# Patient Record
Sex: Male | Born: 1965 | Race: White | Hispanic: No | Marital: Single | Smoking: Never smoker
Health system: Southern US, Community
[De-identification: ages and names within clinical notes are randomized; demographics above are authoritative.]

## PROBLEM LIST (undated history)

## (undated) DIAGNOSIS — M1990 Unspecified osteoarthritis, unspecified site: Secondary | ICD-10-CM

## (undated) DIAGNOSIS — S82261N Displaced segmental fracture of shaft of right tibia, subsequent encounter for open fracture type IIIA, IIIB, or IIIC with nonunion: Secondary | ICD-10-CM

## (undated) DIAGNOSIS — I517 Cardiomegaly: Secondary | ICD-10-CM

## (undated) DIAGNOSIS — K219 Gastro-esophageal reflux disease without esophagitis: Secondary | ICD-10-CM

## (undated) DIAGNOSIS — F17200 Nicotine dependence, unspecified, uncomplicated: Secondary | ICD-10-CM

## (undated) HISTORY — PX: HEMICOLECTOMY: SHX854

## (undated) HISTORY — PX: VASECTOMY: SHX75

## (undated) HISTORY — PX: COLON SURGERY: SHX602

## (undated) HISTORY — PX: APPENDECTOMY: SHX54

## (undated) HISTORY — PX: INGUINAL HERNIA REPAIR: SUR1180

## (undated) HISTORY — PX: TONSILLECTOMY: SUR1361

---

## 2003-04-21 ENCOUNTER — Emergency Department (HOSPITAL_COMMUNITY): Admission: EM | Admit: 2003-04-21 | Discharge: 2003-04-21 | Payer: Self-pay | Admitting: Emergency Medicine

## 2008-10-12 ENCOUNTER — Ambulatory Visit: Payer: Self-pay | Admitting: Cardiology

## 2008-10-12 DIAGNOSIS — R9431 Abnormal electrocardiogram [ECG] [EKG]: Secondary | ICD-10-CM

## 2008-10-12 DIAGNOSIS — R0602 Shortness of breath: Secondary | ICD-10-CM

## 2010-08-27 ENCOUNTER — Encounter: Payer: Self-pay | Admitting: Cardiology

## 2012-03-20 ENCOUNTER — Encounter (HOSPITAL_BASED_OUTPATIENT_CLINIC_OR_DEPARTMENT_OTHER): Payer: Self-pay | Admitting: *Deleted

## 2012-03-20 ENCOUNTER — Emergency Department (HOSPITAL_BASED_OUTPATIENT_CLINIC_OR_DEPARTMENT_OTHER)
Admission: EM | Admit: 2012-03-20 | Discharge: 2012-03-21 | Disposition: A | Discharge status: Home / Self Care | Payer: 59 | Attending: Emergency Medicine | Admitting: Emergency Medicine

## 2012-03-20 DIAGNOSIS — Y9389 Activity, other specified: Secondary | ICD-10-CM | POA: Insufficient documentation

## 2012-03-20 DIAGNOSIS — R21 Rash and other nonspecific skin eruption: Secondary | ICD-10-CM | POA: Insufficient documentation

## 2012-03-20 DIAGNOSIS — Y92009 Unspecified place in unspecified non-institutional (private) residence as the place of occurrence of the external cause: Secondary | ICD-10-CM | POA: Insufficient documentation

## 2012-03-20 DIAGNOSIS — IMO0001 Reserved for inherently not codable concepts without codable children: Secondary | ICD-10-CM | POA: Insufficient documentation

## 2012-03-20 MED ORDER — DOXYCYCLINE MONOHYDRATE 100 MG PO CAPS: 100.0000 mg | ORAL_CAPSULE | Freq: Two times a day (BID) | ORAL | Status: DC

## 2012-03-20 MED ORDER — DOXYCYCLINE HYCLATE 100 MG PO TABS
100.0000 mg | ORAL_TABLET | Freq: Once | ORAL | Status: AC
  Administered 2012-03-21: 100 mg via ORAL
  Filled 2012-03-20: qty 1

## 2012-03-20 MED ORDER — NAPROXEN 250 MG PO TABS
500.0000 mg | ORAL_TABLET | Freq: Once | ORAL | Status: AC
  Administered 2012-03-21: 500 mg via ORAL
  Filled 2012-03-20: qty 2

## 2012-03-20 NOTE — ED Notes (Signed)
Pt reports yesterday he was crawling under a house in the crawl space, later yesterday evening pt began experiencing pain and redness to left FA - pt has taken OTC aleve w/ relief of pain. Pt unsure of any insect bites, pt states pain has been radiating up his left arm. Denies itching, fever, body aches, chills. Erythema is approx 5x9cm in size, no induration noted on assessment.

## 2012-03-20 NOTE — ED Notes (Signed)
Pt reports around midnight last night he noticed an area on left forearm, that is painful, red and swollen. Says he feels like the pain goes up his arm

## 2012-03-20 NOTE — ED Provider Notes (Signed)
History     CSN: [id number]  Arrival date & time 03/20/12  2314   First MD Initiated Contact with Patient 03/20/12 2333      Chief Complaint  Patient presents with  . Rash    (Consider location/radiation/quality/duration/timing/severity/associated sxs/prior treatment) HPI Is a 47 year old male who was working underneath his house yesterday evening about 6 PM. About midnight he noticed redness, pain and swelling on the radial side of his distal left wrist. He has had persistent pain and swelling at that spot. It is tender to palpation. He describes the pain as moderate to severe and feeling like a pressure or an aching. It is worse with palpation or movement. It has caused him to have limited use of his left hand. He denies systemic symptoms such as fever, chills, shortness of breath, nausea, vomiting or diarrhea. Is not aware of any trauma. He did not see any insect or arthropod. He has taken Aleve with transient relief of the pain. He has not had any Aleve since about noon.  History reviewed. No pertinent past medical history.  Past Surgical History  Procedure Laterality Date  . Hemicolectomy      Apparently for treatment of appendiceal abscess  . Hernia patch    . Vasectomy      Family History  Problem Relation Age of Onset  . Cardiomyopathy Neg Hx   . Coronary artery disease Neg Hx   . Heart disease Neg Hx     History  Substance Use Topics  . Smoking status: Never Smoker   . Smokeless tobacco: Not on file  . Alcohol Use: Yes     Comment: Rarely      Review of Systems  All other systems reviewed and are negative.    Allergies  Ibuprofen; Metronidazole; and Penicillins  Home Medications  No current outpatient prescriptions on file.  Temp(Src) 98.3 F (36.8 C)  Physical Exam General: Well-developed, well-nourished male in no acute distress; appearance consistent with age of record HENT: normocephalic, atraumatic Eyes: pupils equal round and reactive to  light; extraocular muscles intact Neck: supple Heart: regular rate and rhythm Lungs: clear to auscultation bilaterally Abdomen: soft; nondistended; nontender; bowel sounds present Extremities: No deformity; full range of motion Neurologic: Awake, alert and oriented; motor function intact in all extremities and symmetric; no facial droop Skin: Warm and dry; mildly erythematous, tender, swollen area of the radial side of the distal left wrist about 9 x 5 cm without warmth or lymphangitis Psychiatric: Normal mood and affect    ED Course  Procedures (including critical care time)    MDM  Examination suspicious for insect or arthropod bite. We will treat with an antibiotic as this may represent an early infection.        Hanley Seamen, MD 03/20/12 2351

## 2012-03-21 NOTE — ED Notes (Signed)
Pt ambulating independently w/ steady gait on d/c in no acute distress, A&Ox4. D/c instructions reviewed w/ pt and family - pt and family deny any further questions or concerns at present. Rx given x1  

## 2013-01-14 HISTORY — PX: FRACTURE SURGERY: SHX138

## 2013-10-07 ENCOUNTER — Emergency Department (HOSPITAL_COMMUNITY): Payer: 59

## 2013-10-07 ENCOUNTER — Inpatient Hospital Stay (HOSPITAL_COMMUNITY)
Admission: EM | Admit: 2013-10-07 | Discharge: 2013-10-11 | DRG: 493 | Disposition: A | Discharge status: Home Health | Payer: 59 | Attending: Orthopedic Surgery | Admitting: Orthopedic Surgery

## 2013-10-07 ENCOUNTER — Encounter (HOSPITAL_COMMUNITY): Payer: 59 | Admitting: Anesthesiology

## 2013-10-07 ENCOUNTER — Encounter (HOSPITAL_COMMUNITY): Payer: Self-pay | Admitting: Emergency Medicine

## 2013-10-07 ENCOUNTER — Encounter (HOSPITAL_COMMUNITY)
Admission: EM | Disposition: A | Discharge status: Home Health | Payer: Self-pay | Source: Home / Self Care | Attending: Orthopedic Surgery

## 2013-10-07 ENCOUNTER — Inpatient Hospital Stay (HOSPITAL_COMMUNITY): Payer: 59 | Admitting: Anesthesiology

## 2013-10-07 DIAGNOSIS — S82209B Unspecified fracture of shaft of unspecified tibia, initial encounter for open fracture type I or II: Principal | ICD-10-CM | POA: Diagnosis present

## 2013-10-07 DIAGNOSIS — Z886 Allergy status to analgesic agent status: Secondary | ICD-10-CM | POA: Diagnosis not present

## 2013-10-07 DIAGNOSIS — S82201C Unspecified fracture of shaft of right tibia, initial encounter for open fracture type IIIA, IIIB, or IIIC: Secondary | ICD-10-CM

## 2013-10-07 DIAGNOSIS — D62 Acute posthemorrhagic anemia: Secondary | ICD-10-CM | POA: Diagnosis not present

## 2013-10-07 DIAGNOSIS — Z880 Allergy status to penicillin: Secondary | ICD-10-CM | POA: Diagnosis not present

## 2013-10-07 DIAGNOSIS — D500 Iron deficiency anemia secondary to blood loss (chronic): Secondary | ICD-10-CM | POA: Diagnosis present

## 2013-10-07 DIAGNOSIS — S82401C Unspecified fracture of shaft of right fibula, initial encounter for open fracture type IIIA, IIIB, or IIIC: Secondary | ICD-10-CM

## 2013-10-07 DIAGNOSIS — Z23 Encounter for immunization: Secondary | ICD-10-CM | POA: Diagnosis not present

## 2013-10-07 DIAGNOSIS — S82409B Unspecified fracture of shaft of unspecified fibula, initial encounter for open fracture type I or II: Principal | ICD-10-CM | POA: Diagnosis present

## 2013-10-07 DIAGNOSIS — Z888 Allergy status to other drugs, medicaments and biological substances status: Secondary | ICD-10-CM | POA: Diagnosis not present

## 2013-10-07 HISTORY — DX: Cardiomegaly: I51.7

## 2013-10-07 HISTORY — DX: Gastro-esophageal reflux disease without esophagitis: K21.9

## 2013-10-07 HISTORY — DX: Rider (driver) (passenger) of other motorcycle injured in unspecified traffic accident, initial encounter: V29.99XA

## 2013-10-07 HISTORY — PX: TIBIA IM NAIL INSERTION: SHX2516

## 2013-10-07 HISTORY — DX: Unspecified osteoarthritis, unspecified site: M19.90

## 2013-10-07 LAB — CBC
HCT: 39.8 % (ref 39.0–52.0)
HEMOGLOBIN: 13.6 g/dL (ref 13.0–17.0)
MCH: 31.8 pg (ref 26.0–34.0)
MCHC: 34.2 g/dL (ref 30.0–36.0)
MCV: 93 fL (ref 78.0–100.0)
PLATELETS: 224 10*3/uL (ref 150–400)
RBC: 4.28 MIL/uL (ref 4.22–5.81)
RDW: 13.1 % (ref 11.5–15.5)
WBC: 8.2 10*3/uL (ref 4.0–10.5)

## 2013-10-07 LAB — COMPREHENSIVE METABOLIC PANEL
ALBUMIN: 3.5 g/dL (ref 3.5–5.2)
ALT: 18 U/L (ref 0–53)
AST: 20 U/L (ref 0–37)
Alkaline Phosphatase: 89 U/L (ref 39–117)
Anion gap: 13 (ref 5–15)
BILIRUBIN TOTAL: 0.4 mg/dL (ref 0.3–1.2)
BUN: 15 mg/dL (ref 6–23)
CALCIUM: 8.7 mg/dL (ref 8.4–10.5)
CO2: 20 mEq/L (ref 19–32)
CREATININE: 1.08 mg/dL (ref 0.50–1.35)
Chloride: 104 mEq/L (ref 96–112)
GFR calc Af Amer: >90 mL/min (ref >90)
GFR, EST NON AFRICAN AMERICAN: 80 mL/min — AB (ref >90)
GLUCOSE: 129 mg/dL — AB (ref 70–99)
Potassium: 3.8 mEq/L (ref 3.7–5.3)
Sodium: 137 mEq/L (ref 137–147)
TOTAL PROTEIN: 6.5 g/dL (ref 6.0–8.3)

## 2013-10-07 LAB — ABO/RH: ABO/RH(D): O POS

## 2013-10-07 LAB — CDS SEROLOGY

## 2013-10-07 LAB — PROTIME-INR
INR: 1.02 (ref 0.00–1.49)
PROTHROMBIN TIME: 13.4 s (ref 11.6–15.2)

## 2013-10-07 LAB — ETHANOL: Alcohol, Ethyl (B): <11 mg/dL (ref 0–11)

## 2013-10-07 SURGERY — INSERTION, INTRAMEDULLARY ROD, TIBIA
Anesthesia: General | Site: Leg Lower | Laterality: Right

## 2013-10-07 MED ORDER — PHENYLEPHRINE HCL 10 MG/ML IJ SOLN
INTRAMUSCULAR | Status: DC | PRN
  Administered 2013-10-07 (×8): 40 ug via INTRAVENOUS

## 2013-10-07 MED ORDER — FENTANYL CITRATE 0.05 MG/ML IJ SOLN
INTRAMUSCULAR | Status: AC
  Filled 2013-10-07: qty 2

## 2013-10-07 MED ORDER — ROCURONIUM BROMIDE 50 MG/5ML IV SOLN
INTRAVENOUS | Status: AC
  Filled 2013-10-07: qty 1

## 2013-10-07 MED ORDER — HYDROMORPHONE HCL 1 MG/ML IJ SOLN
INTRAMUSCULAR | Status: AC
  Administered 2013-10-07: 1 mg
  Filled 2013-10-07: qty 1

## 2013-10-07 MED ORDER — LIDOCAINE HCL (CARDIAC) 20 MG/ML IV SOLN
INTRAVENOUS | Status: DC | PRN
  Administered 2013-10-07: 20 mg via INTRAVENOUS

## 2013-10-07 MED ORDER — ALBUMIN HUMAN 5 % IV SOLN
INTRAVENOUS | Status: DC | PRN
  Administered 2013-10-07 (×2): via INTRAVENOUS

## 2013-10-07 MED ORDER — LACTATED RINGERS IV SOLN
INTRAVENOUS | Status: DC | PRN
  Administered 2013-10-07 (×2): via INTRAVENOUS

## 2013-10-07 MED ORDER — CEFAZOLIN SODIUM-DEXTROSE 2-3 GM-% IV SOLR
INTRAVENOUS | Status: DC | PRN
  Administered 2013-10-07: 2 g via INTRAVENOUS

## 2013-10-07 MED ORDER — LIDOCAINE HCL (CARDIAC) 20 MG/ML IV SOLN
INTRAVENOUS | Status: AC
  Filled 2013-10-07: qty 5

## 2013-10-07 MED ORDER — ROCURONIUM BROMIDE 100 MG/10ML IV SOLN
INTRAVENOUS | Status: DC | PRN
  Administered 2013-10-07: 20 mg via INTRAVENOUS

## 2013-10-07 MED ORDER — MORPHINE SULFATE 4 MG/ML IJ SOLN
8.0000 mg | Freq: Once | INTRAMUSCULAR | Status: AC
  Administered 2013-10-07: 8 mg via INTRAVENOUS
  Filled 2013-10-07: qty 2

## 2013-10-07 MED ORDER — TETANUS-DIPHTH-ACELL PERTUSSIS 5-2.5-18.5 LF-MCG/0.5 IM SUSP
INTRAMUSCULAR | Status: AC
  Filled 2013-10-07: qty 0.5

## 2013-10-07 MED ORDER — ONDANSETRON HCL 4 MG/2ML IJ SOLN
INTRAMUSCULAR | Status: AC
  Filled 2013-10-07: qty 2

## 2013-10-07 MED ORDER — CEFAZOLIN SODIUM-DEXTROSE 2-3 GM-% IV SOLR
INTRAVENOUS | Status: DC
Start: 2013-10-07 — End: 2013-10-07
  Filled 2013-10-07: qty 50

## 2013-10-07 MED ORDER — GLYCOPYRROLATE 0.2 MG/ML IJ SOLN
INTRAMUSCULAR | Status: AC
  Filled 2013-10-07: qty 3

## 2013-10-07 MED ORDER — MIDAZOLAM HCL 5 MG/5ML IJ SOLN
INTRAMUSCULAR | Status: DC | PRN
  Administered 2013-10-07: 2 mg via INTRAVENOUS

## 2013-10-07 MED ORDER — CLINDAMYCIN PHOSPHATE 600 MG/50ML IV SOLN: 600.0000 mg | Freq: Once | INTRAVENOUS | Status: DC

## 2013-10-07 MED ORDER — PROPOFOL 10 MG/ML IV BOLUS
INTRAVENOUS | Status: DC | PRN
  Administered 2013-10-07: 200 mg via INTRAVENOUS

## 2013-10-07 MED ORDER — IOHEXOL 300 MG/ML  SOLN
100.0000 mL | Freq: Once | INTRAMUSCULAR | Status: AC | PRN
  Administered 2013-10-07: 100 mL via INTRAVENOUS

## 2013-10-07 MED ORDER — CEFAZOLIN SODIUM 1-5 GM-% IV SOLN
1.0000 g | Freq: Once | INTRAVENOUS | Status: DC
  Administered 2013-10-07: 1 g via INTRAVENOUS
  Filled 2013-10-07: qty 50

## 2013-10-07 MED ORDER — EPHEDRINE SULFATE 50 MG/ML IJ SOLN
INTRAMUSCULAR | Status: DC | PRN
  Administered 2013-10-07 (×5): 10 mg via INTRAVENOUS

## 2013-10-07 MED ORDER — NEOSTIGMINE METHYLSULFATE 10 MG/10ML IV SOLN
INTRAVENOUS | Status: AC
  Filled 2013-10-07: qty 1

## 2013-10-07 MED ORDER — CEFAZOLIN SODIUM-DEXTROSE 2-3 GM-% IV SOLR
INTRAVENOUS | Status: AC
  Filled 2013-10-07: qty 50

## 2013-10-07 MED ORDER — FENTANYL CITRATE 0.05 MG/ML IJ SOLN
100.0000 ug | Freq: Once | INTRAMUSCULAR | Status: AC
  Administered 2013-10-07: 100 ug via INTRAVENOUS

## 2013-10-07 MED ORDER — LEVOFLOXACIN IN D5W 750 MG/150ML IV SOLN
750.0000 mg | Freq: Once | INTRAVENOUS | Status: AC
  Administered 2013-10-07: 750 mg via INTRAVENOUS
  Filled 2013-10-07: qty 150

## 2013-10-07 MED ORDER — SODIUM CHLORIDE 0.9 % IV BOLUS (SEPSIS)
1000.0000 mL | Freq: Once | INTRAVENOUS | Status: AC
  Administered 2013-10-07: 1000 mL via INTRAVENOUS

## 2013-10-07 MED ORDER — FENTANYL CITRATE 0.05 MG/ML IJ SOLN
INTRAMUSCULAR | Status: AC
  Administered 2013-10-07: 100 ug
  Filled 2013-10-07: qty 2

## 2013-10-07 MED ORDER — SUCCINYLCHOLINE CHLORIDE 20 MG/ML IJ SOLN
INTRAMUSCULAR | Status: DC | PRN
  Administered 2013-10-07: 100 mg via INTRAVENOUS

## 2013-10-07 MED ORDER — FENTANYL CITRATE 0.05 MG/ML IJ SOLN
INTRAMUSCULAR | Status: DC | PRN
  Administered 2013-10-07 (×2): 50 ug via INTRAVENOUS
  Administered 2013-10-07: 100 ug via INTRAVENOUS
  Administered 2013-10-07: 50 ug via INTRAVENOUS

## 2013-10-07 MED ORDER — 0.9 % SODIUM CHLORIDE (POUR BTL) OPTIME
TOPICAL | Status: DC | PRN
  Administered 2013-10-07: 1000 mL

## 2013-10-07 MED ORDER — HYDROMORPHONE HCL 1 MG/ML IJ SOLN
1.0000 mg | Freq: Once | INTRAMUSCULAR | Status: AC
  Administered 2013-10-07: 1 mg via INTRAVENOUS
  Filled 2013-10-07: qty 1

## 2013-10-07 MED ORDER — TETANUS-DIPHTH-ACELL PERTUSSIS 5-2.5-18.5 LF-MCG/0.5 IM SUSP
0.5000 mL | Freq: Once | INTRAMUSCULAR | Status: AC
  Administered 2013-10-07: 0.5 mL via INTRAMUSCULAR
  Filled 2013-10-07: qty 0.5

## 2013-10-07 MED ORDER — PROPOFOL 10 MG/ML IV BOLUS
INTRAVENOUS | Status: AC
  Filled 2013-10-07: qty 20

## 2013-10-07 MED ORDER — FENTANYL CITRATE 0.05 MG/ML IJ SOLN
INTRAMUSCULAR | Status: AC
  Filled 2013-10-07: qty 5

## 2013-10-07 MED ORDER — MIDAZOLAM HCL 2 MG/2ML IJ SOLN
INTRAMUSCULAR | Status: AC
  Filled 2013-10-07: qty 2

## 2013-10-07 MED ORDER — ARTIFICIAL TEARS OP OINT
TOPICAL_OINTMENT | OPHTHALMIC | Status: DC | PRN
  Administered 2013-10-07: 1 via OPHTHALMIC

## 2013-10-07 MED ORDER — SUCCINYLCHOLINE CHLORIDE 20 MG/ML IJ SOLN
INTRAMUSCULAR | Status: AC
  Filled 2013-10-07: qty 1

## 2013-10-07 MED ORDER — SODIUM CHLORIDE 0.9 % IR SOLN
Status: DC | PRN
  Administered 2013-10-07: 3000 mL

## 2013-10-07 SURGICAL SUPPLY — 72 items
BANDAGE ELASTIC 4 VELCRO ST LF (GAUZE/BANDAGES/DRESSINGS) IMPLANT
BANDAGE ELASTIC 6 VELCRO ST LF (GAUZE/BANDAGES/DRESSINGS) ×3 IMPLANT
BIT DRILL AO GAMMA 4.2X340 (BIT) ×3 IMPLANT
BIT DRILL AO STRL 3.5X130MM (BIT) ×1 IMPLANT
BLADE SURG 10 STRL SS (BLADE) IMPLANT
BNDG COHESIVE 4X5 TAN STRL (GAUZE/BANDAGES/DRESSINGS) IMPLANT
BNDG GAUZE ELAST 4 BULKY (GAUZE/BANDAGES/DRESSINGS) ×6 IMPLANT
CANISTER WOUND CARE 500ML ATS (WOUND CARE) ×3 IMPLANT
CHLORAPREP W/TINT 26ML (MISCELLANEOUS) IMPLANT
COVER MAYO STAND STRL (DRAPES) IMPLANT
COVER SURGICAL LIGHT HANDLE (MISCELLANEOUS) ×6 IMPLANT
CUFF TOURNIQUET SINGLE 34IN LL (TOURNIQUET CUFF) ×3 IMPLANT
CUFF TOURNIQUET SINGLE 44IN (TOURNIQUET CUFF) IMPLANT
DRAPE C-ARM 42X72 X-RAY (DRAPES) ×3 IMPLANT
DRAPE C-ARMOR (DRAPES) ×3 IMPLANT
DRAPE PROXIMA HALF (DRAPES) ×3 IMPLANT
DRESSING OPSITE X SMALL 2X3 (GAUZE/BANDAGES/DRESSINGS) IMPLANT
DRILL AO STERILE 3.5X130MM (BIT) ×3
DRSG ADAPTIC 3X8 NADH LF (GAUZE/BANDAGES/DRESSINGS) ×3 IMPLANT
DRSG MEPITEL 4X7.2 (GAUZE/BANDAGES/DRESSINGS) ×3 IMPLANT
DRSG PAD ABDOMINAL 8X10 ST (GAUZE/BANDAGES/DRESSINGS) ×6 IMPLANT
DRSG TEGADERM 4X4.75 (GAUZE/BANDAGES/DRESSINGS) IMPLANT
DRSG VAC ATS SM SENSATRAC (GAUZE/BANDAGES/DRESSINGS) ×3 IMPLANT
ELECT CAUTERY BLADE 6.4 (BLADE) IMPLANT
ELECT REM PT RETURN 9FT ADLT (ELECTROSURGICAL) ×3
ELECTRODE REM PT RTRN 9FT ADLT (ELECTROSURGICAL) ×1 IMPLANT
GAUZE SPONGE 4X4 12PLY STRL (GAUZE/BANDAGES/DRESSINGS) ×3 IMPLANT
GLOVE BIO SURGEON STRL SZ7.5 (GLOVE) ×3 IMPLANT
GLOVE BIO SURGEON STRL SZ8.5 (GLOVE) ×3 IMPLANT
GLOVE BIOGEL PI IND STRL 8 (GLOVE) ×3 IMPLANT
GLOVE BIOGEL PI INDICATOR 8 (GLOVE) ×6
GLOVE ORTHO TXT STRL SZ7.5 (GLOVE) ×3 IMPLANT
GOWN STRL REUS W/ TWL LRG LVL3 (GOWN DISPOSABLE) ×2 IMPLANT
GOWN STRL REUS W/TWL LRG LVL3 (GOWN DISPOSABLE) ×4
GUIDEWIRE GAMMA (WIRE) ×3 IMPLANT
GUIDEWIRE GAMMA 800 (WIRE) ×3 IMPLANT
KIT BASIN OR (CUSTOM PROCEDURE TRAY) ×3 IMPLANT
KIT ROOM TURNOVER OR (KITS) ×3 IMPLANT
MANIFOLD NEPTUNE II (INSTRUMENTS) ×3 IMPLANT
NAIL TIBIAL STD 10X375 (Nail) ×3 IMPLANT
NS IRRIG 1000ML POUR BTL (IV SOLUTION) ×3 IMPLANT
PACK ORTHO EXTREMITY (CUSTOM PROCEDURE TRAY) ×3 IMPLANT
PAD ARMBOARD 7.5X6 YLW CONV (MISCELLANEOUS) ×6 IMPLANT
PAD CAST 4YDX4 CTTN HI CHSV (CAST SUPPLIES) ×1 IMPLANT
PADDING CAST COTTON 4X4 STRL (CAST SUPPLIES) ×2
REAMER SHAFT BIXCUT (INSTRUMENTS) ×3 IMPLANT
SCREW GAMMA (Screw) ×3 IMPLANT
SCREW LOCKING T2 F/T  5MMX50MM (Screw) ×2 IMPLANT
SCREW LOCKING T2 F/T  5MMX70MM (Screw) ×2 IMPLANT
SCREW LOCKING T2 F/T  5X37.5MM (Screw) ×2 IMPLANT
SCREW LOCKING T2 F/T 5MMX50MM (Screw) ×1 IMPLANT
SCREW LOCKING T2 F/T 5MMX70MM (Screw) ×1 IMPLANT
SCREW LOCKING T2 F/T 5X37.5MM (Screw) ×1 IMPLANT
SPONGE LAP 18X18 X RAY DECT (DISPOSABLE) ×3 IMPLANT
STAPLER VISISTAT 35W (STAPLE) IMPLANT
SUT ETHILON 3 0 PS 1 (SUTURE) ×6 IMPLANT
SUT MNCRL AB 4-0 PS2 18 (SUTURE) ×3 IMPLANT
SUT MON AB 2-0 CT1 27 (SUTURE) IMPLANT
SUT MON AB 2-0 CT1 36 (SUTURE) ×3 IMPLANT
SUT PDS AB 1 CT  36 (SUTURE) ×4
SUT PDS AB 1 CT 36 (SUTURE) ×2 IMPLANT
SUT VIC AB 0 CT1 27 (SUTURE) ×2
SUT VIC AB 0 CT1 27XBRD ANBCTR (SUTURE) ×1 IMPLANT
SYR BULB IRRIGATION 50ML (SYRINGE) IMPLANT
TOWEL OR 17X24 6PK STRL BLUE (TOWEL DISPOSABLE) ×3 IMPLANT
TOWEL OR 17X26 10 PK STRL BLUE (TOWEL DISPOSABLE) ×3 IMPLANT
TOWEL OR NON WOVEN STRL DISP B (DISPOSABLE) IMPLANT
TRAY FOLEY CATH 16FR SILVER (SET/KITS/TRAYS/PACK) ×3 IMPLANT
TUBE CONNECTING 12'X1/4 (SUCTIONS) ×1
TUBE CONNECTING 12X1/4 (SUCTIONS) ×2 IMPLANT
WATER STERILE IRR 1000ML POUR (IV SOLUTION) ×3 IMPLANT
YANKAUER SUCT BULB TIP NO VENT (SUCTIONS) ×3 IMPLANT
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
## 2013-10-07 NOTE — ED Notes (Signed)
Pt trtansported to CT
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
## 2013-10-07 NOTE — Anesthesia Preprocedure Evaluation (Addendum)
Anesthesia Evaluation  Patient identified by MRN, date of birth, ID band Patient awake    Reviewed: Allergy & Precautions, H&P , NPO status , Patient's Chart, lab work & pertinent test results  Airway Mallampati: II TM Distance: >3 FB Neck ROM: Full    Dental  (+) Teeth Intact, Dental Advisory Given   Pulmonary          Cardiovascular  2010- exercise stress test for dyspnea   Neuro/Psych    GI/Hepatic GERD-  ,  Endo/Other    Renal/GU      Musculoskeletal   Abdominal   Peds  Hematology   Anesthesia Other Findings   Reproductive/Obstetrics                          Anesthesia Physical Anesthesia Plan  ASA: II and emergent  Anesthesia Plan: General   Post-op Pain Management:    Induction: Intravenous  Airway Management Planned: Oral ETT  Additional Equipment:   Intra-op Plan:   Post-operative Plan: Extubation in OR  Informed Consent: I have reviewed the patients History and Physical, chart, labs and discussed the procedure including the risks, benefits and alternatives for the proposed anesthesia with the patient or authorized representative who has indicated his/her understanding and acceptance.     Plan Discussed with: Anesthesiologist and Surgeon  Anesthesia Plan Comments:         Anesthesia Quick Evaluation

## 2013-10-07 NOTE — Progress Notes (Signed)
Orthopedic Tech Progress Note Patient Details:  Bruce Escobar Dec 27, 1965 [id number]  Ortho Devices Type of Ortho Device: Ace wrap;Post (short leg) splint Ortho Device/Splint Location: rle Ortho Device/Splint Interventions: Application   [name] 10/07/2013, 7:19 PM

## 2013-10-07 NOTE — ED Notes (Signed)
Pt is using cell phone. Does not want me to contact family for him.

## 2013-10-08 ENCOUNTER — Encounter (HOSPITAL_COMMUNITY): Payer: Self-pay | Admitting: Orthopedic Surgery

## 2013-10-08 ENCOUNTER — Inpatient Hospital Stay (HOSPITAL_COMMUNITY): Payer: 59

## 2013-10-08 DIAGNOSIS — S82209B Unspecified fracture of shaft of unspecified tibia, initial encounter for open fracture type I or II: Secondary | ICD-10-CM | POA: Diagnosis present

## 2013-10-08 LAB — POCT I-STAT EG7
Acid-base deficit: 6 mmol/L — ABNORMAL HIGH (ref 0.0–2.0)
Bicarbonate: 20.6 mEq/L (ref 20.0–24.0)
CALCIUM ION: 1.12 mmol/L (ref 1.12–1.23)
HEMATOCRIT: 24 % — AB (ref 39.0–52.0)
HEMOGLOBIN: 8.2 g/dL — AB (ref 13.0–17.0)
O2 Saturation: 97 %
PCO2 VEN: 43.1 mmHg — AB (ref 45.0–50.0)
PH VEN: 7.286 (ref 7.250–7.300)
Potassium: 5.4 mEq/L — ABNORMAL HIGH (ref 3.7–5.3)
SODIUM: 139 meq/L (ref 137–147)
TCO2: 22 mmol/L (ref 0–100)
pO2, Ven: 96 mmHg — ABNORMAL HIGH (ref 30.0–45.0)

## 2013-10-08 LAB — SAMPLE TO BLOOD BANK

## 2013-10-08 LAB — POCT I-STAT 4, (NA,K, GLUC, HGB,HCT)
GLUCOSE: 157 mg/dL — AB (ref 70–99)
HEMATOCRIT: 25 % — AB (ref 39.0–52.0)
Hemoglobin: 8.5 g/dL — ABNORMAL LOW (ref 13.0–17.0)
Potassium: 4.9 mEq/L (ref 3.7–5.3)
Sodium: 140 mEq/L (ref 137–147)

## 2013-10-08 LAB — CBC
HCT: 22.1 % — ABNORMAL LOW (ref 39.0–52.0)
HEMOGLOBIN: 7.8 g/dL — AB (ref 13.0–17.0)
MCH: 31.2 pg (ref 26.0–34.0)
MCHC: 35.3 g/dL (ref 30.0–36.0)
MCV: 88.4 fL (ref 78.0–100.0)
Platelets: 106 10*3/uL — ABNORMAL LOW (ref 150–400)
RBC: 2.5 MIL/uL — ABNORMAL LOW (ref 4.22–5.81)
RDW: 14.9 % (ref 11.5–15.5)
WBC: 6.1 10*3/uL (ref 4.0–10.5)

## 2013-10-08 LAB — PREPARE RBC (CROSSMATCH)

## 2013-10-08 MED ORDER — SODIUM CHLORIDE 0.9 % IV SOLN: Freq: Once | INTRAVENOUS | Status: DC

## 2013-10-08 MED ORDER — CEFAZOLIN SODIUM-DEXTROSE 2-3 GM-% IV SOLR
2.0000 g | Freq: Four times a day (QID) | INTRAVENOUS | Status: AC
  Administered 2013-10-08 (×3): 2 g via INTRAVENOUS
  Filled 2013-10-08 (×3): qty 50

## 2013-10-08 MED ORDER — DIPHENHYDRAMINE HCL 12.5 MG/5ML PO ELIX: 12.5000 mg | ORAL_SOLUTION | ORAL | Status: DC | PRN

## 2013-10-08 MED ORDER — OXYCODONE-ACETAMINOPHEN 5-325 MG PO TABS: 2.0000 | ORAL_TABLET | ORAL | Status: DC | PRN

## 2013-10-08 MED ORDER — ONDANSETRON HCL 4 MG/2ML IJ SOLN
INTRAMUSCULAR | Status: DC | PRN
  Administered 2013-10-08: 4 mg via INTRAVENOUS

## 2013-10-08 MED ORDER — ASPIRIN 81 MG PO TABS: 81.0000 mg | ORAL_TABLET | Freq: Every day | ORAL | Status: DC

## 2013-10-08 MED ORDER — HYDROMORPHONE HCL 1 MG/ML IJ SOLN
INTRAMUSCULAR | Status: DC | PRN
  Administered 2013-10-08: 1 mg via INTRAVENOUS

## 2013-10-08 MED ORDER — DOCUSATE SODIUM 100 MG PO CAPS
100.0000 mg | ORAL_CAPSULE | Freq: Two times a day (BID) | ORAL | Status: DC
  Administered 2013-10-08 – 2013-10-11 (×8): 100 mg via ORAL
  Filled 2013-10-08 (×13): qty 1

## 2013-10-08 MED ORDER — HYDROMORPHONE HCL 1 MG/ML IJ SOLN
INTRAMUSCULAR | Status: AC
  Filled 2013-10-08: qty 1

## 2013-10-08 MED ORDER — METOCLOPRAMIDE HCL 5 MG/ML IJ SOLN: 5.0000 mg | Freq: Three times a day (TID) | INTRAMUSCULAR | Status: DC | PRN

## 2013-10-08 MED ORDER — NEOSTIGMINE METHYLSULFATE 10 MG/10ML IV SOLN
INTRAVENOUS | Status: DC | PRN
  Administered 2013-10-08: 3 mg via INTRAVENOUS

## 2013-10-08 MED ORDER — METHOCARBAMOL 1000 MG/10ML IJ SOLN
500.0000 mg | Freq: Four times a day (QID) | INTRAMUSCULAR | Status: DC | PRN
  Filled 2013-10-08: qty 5

## 2013-10-08 MED ORDER — METHOCARBAMOL 500 MG PO TABS
500.0000 mg | ORAL_TABLET | Freq: Four times a day (QID) | ORAL | Status: DC | PRN
  Administered 2013-10-08 – 2013-10-10 (×5): 500 mg via ORAL
  Filled 2013-10-08 (×5): qty 1

## 2013-10-08 MED ORDER — ONDANSETRON HCL 4 MG PO TABS: 4.0000 mg | ORAL_TABLET | Freq: Three times a day (TID) | ORAL | Status: DC | PRN

## 2013-10-08 MED ORDER — DOCUSATE SODIUM 100 MG PO CAPS: 100.0000 mg | ORAL_CAPSULE | Freq: Two times a day (BID) | ORAL | Status: DC

## 2013-10-08 MED ORDER — ONDANSETRON HCL 4 MG/2ML IJ SOLN: 4.0000 mg | Freq: Four times a day (QID) | INTRAMUSCULAR | Status: DC | PRN

## 2013-10-08 MED ORDER — HYDROMORPHONE HCL 1 MG/ML IJ SOLN
1.0000 mg | INTRAMUSCULAR | Status: DC | PRN
  Administered 2013-10-08 – 2013-10-09 (×10): 1 mg via INTRAVENOUS
  Filled 2013-10-08 (×10): qty 1

## 2013-10-08 MED ORDER — GLYCOPYRROLATE 0.2 MG/ML IJ SOLN
INTRAMUSCULAR | Status: DC | PRN
  Administered 2013-10-08: 0.4 mg via INTRAVENOUS

## 2013-10-08 MED ORDER — METOCLOPRAMIDE HCL 10 MG PO TABS: 5.0000 mg | ORAL_TABLET | Freq: Three times a day (TID) | ORAL | Status: DC | PRN

## 2013-10-08 MED ORDER — OXYCODONE-ACETAMINOPHEN 5-325 MG PO TABS
1.0000 | ORAL_TABLET | ORAL | Status: DC | PRN
  Administered 2013-10-08: 2 via ORAL
  Administered 2013-10-08: 1 via ORAL
  Administered 2013-10-08 (×2): 2 via ORAL
  Administered 2013-10-09 (×2): 1 via ORAL
  Administered 2013-10-09: 2 via ORAL
  Administered 2013-10-09 – 2013-10-10 (×4): 1 via ORAL
  Administered 2013-10-11: 2 via ORAL
  Administered 2013-10-11 (×3): 1 via ORAL
  Filled 2013-10-08 (×2): qty 2
  Filled 2013-10-08 (×3): qty 1
  Filled 2013-10-08 (×2): qty 2
  Filled 2013-10-08: qty 1
  Filled 2013-10-08: qty 2
  Filled 2013-10-08 (×2): qty 1
  Filled 2013-10-08: qty 2
  Filled 2013-10-08: qty 1
  Filled 2013-10-08: qty 2
  Filled 2013-10-08: qty 1

## 2013-10-08 MED ORDER — ASPIRIN 81 MG PO CHEW
81.0000 mg | CHEWABLE_TABLET | Freq: Every day | ORAL | Status: DC
  Administered 2013-10-08 – 2013-10-11 (×4): 81 mg via ORAL
  Filled 2013-10-08 (×4): qty 1

## 2013-10-08 MED ORDER — ONDANSETRON HCL 4 MG PO TABS: 4.0000 mg | ORAL_TABLET | Freq: Four times a day (QID) | ORAL | Status: DC | PRN

## 2013-10-08 MED ORDER — DEXTROSE-NACL 5-0.45 % IV SOLN
INTRAVENOUS | Status: AC
  Administered 2013-10-08: 06:00:00 via INTRAVENOUS

## 2013-10-08 MED ORDER — SODIUM CHLORIDE 0.9 % IV SOLN: INTRAVENOUS | Status: AC

## 2013-10-08 MED ORDER — ZOLPIDEM TARTRATE 5 MG PO TABS: 5.0000 mg | ORAL_TABLET | Freq: Every evening | ORAL | Status: DC | PRN

## 2013-10-08 NOTE — Op Note (Signed)
10/07/2013  12:32 AM  PATIENT:  Bruce Escobar    PRE-OPERATIVE DIAGNOSIS:  Open Tibia Fibula Fracture  POST-OPERATIVE DIAGNOSIS:  Same  PROCEDURE:  INTRAMEDULLARY  NAIL TIBIAL right  SURGEON:  Ansley Mangiapane, D, MD  ASSISTANT: Janace Litten, OPA, He was necessary for efficiency and safety of the case.   ANESTHESIA:   General  PREOPERATIVE INDICATIONS:  [name] is a  48 y.o. male with a diagnosis of Open Tibia Fibula Fracture who failed conservative measures and elected for surgical management.    The risks benefits and alternatives were discussed with the patient preoperatively including but not limited to the risks of infection, bleeding, nerve injury, cardiopulmonary complications, the need for revision surgery, among others, and the patient was willing to proceed.  OPERATIVE IMPLANTS: Stryker T2 Tibial nail 375 x 10   BLOOD LOSS: minimal  COMPLICATIONS: none  TOURNIQUET TIME: none  OPERATIVE PROCEDURE:  Patient was identified in the preoperative holding area and site was marked by me He was transported to the operating theater and placed on the table in supine position taking care to pad all bony prominences. After a preincinduction time out anesthesia was induced. The right lower extremity was prepped and draped in normal sterile fashion and a pre-incision timeout was performed. Bruce Escobar received Ancef for preoperative antibiotics.   The leg was placed over the radiolucent triangle. I then made a 4 cm incision over the patella tendon. I dissected down and incised the patella tendon taking care to not penetrate into the joint itself.  The compatements were soft and he had a 13cm laceration over his ant/med fracture site, I debrided and removed some devitalized bone. There was no gross contamination. I irrigated his open fracture with 3L of saline.    I placed a guidewire under fluoroscopic guidance just medial to lateral tibial spine. I was happy with this  placement on all views. I used the entry reamer to create a path into the proximal tibia staying out of the joint itself.  I then passed the ball tip guidewire down the tibia and across the fracture site. I held appropriate reduction confirmed on multiple views of fluoroscopy and sequentially reamed up to an appropriate size with appropriate chatter. I selected the above-sized nail and passed it down the ball-tipped guidewire and seated it completely to a was sunk into the proximal tibia.  I then used the outrigger placed a static transverse and 2 oblique proximal locking screws. As happy with her length and placement on multiple oblique fluoroscopic views.  Next I confirmed appropriate rotation of the tibia with fracture tease and orientation of the patella and toes. I then used a perfect circles technique to place 2 distal static interlock screws.  I then used a nylon stitch to re-aproximate his open fracture wound.   Compartments were still soft.   He received 2U pRBC's  The wounds were then all thoroughly irrigated and closed in layers. Sterile dressing was applied, a wound vac was applied to his traumatic wound and he was taken to the PACU in stable condition.  POST OPERATIVE PLAN: NWB RLE, DVT prophylaxis: Early ambulation, SCD's, ASA 325 daily  This note was generated using a template and dragon dictation system. In light of that, I have reviewed the note and all aspects of it are applicable to this case. Any dictation errors are due to the computerized dictation system.

## 2013-10-08 NOTE — Progress Notes (Signed)
Rehab Admissions Coordinator Note:  Patient was screened by [name] for appropriateness for an Inpatient Acute Rehab Consult.  At this time, we are recommending Inpatient Rehab consult.  [name] 10/08/2013, 11:50 AM  I can be reached at [phone].

## 2013-10-08 NOTE — Progress Notes (Signed)
I participated in the care of this patient and agree with the above history, physical and evaluation. I performed a review of the history and a physical exam as detailed   Janiene Aarons Daniel Kalyn Hofstra MD  
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
## 2013-10-08 NOTE — Progress Notes (Signed)
Utilization review completed.  
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
## 2013-10-08 NOTE — Anesthesia Postprocedure Evaluation (Signed)
  Anesthesia Post-op Note  Patient: Bruce Escobar  Procedure(s) Performed: Procedure(s): INTRAMEDULLARY  NAIL TIBIAL right (Right)  Patient Location: PACU  Anesthesia Type:General  Level of Consciousness: awake, alert , oriented and patient cooperative  Airway and Oxygen Therapy: Patient Spontanous Breathing and Patient connected to nasal cannula oxygen  Post-op Pain: mild  Post-op Assessment: Post-op Vital signs reviewed, Patient's Cardiovascular Status Stable, Respiratory Function Stable, Patent Airway, No signs of Nausea or vomiting and Pain level controlled  Post-op Vital Signs: Reviewed and stable  Last Vitals:  Filed Vitals:   10/08/13 0059  BP: 100/59  Pulse: 111  Temp: 36.8 C  Resp: 14    Complications: No apparent anesthesia complications

## 2013-10-08 NOTE — Transfer of Care (Signed)
Immediate Anesthesia Transfer of Care Note  Patient: Bruce Escobar  Procedure(s) Performed: Procedure(s): INTRAMEDULLARY  NAIL TIBIAL right (Right)  Patient Location: PACU  Anesthesia Type:General  Level of Consciousness: awake, alert  and oriented  Airway & Oxygen Therapy: Patient Spontanous Breathing and Patient connected to nasal cannula oxygen  Post-op Assessment: Report given to PACU RN and Post -op Vital signs reviewed and stable  Post vital signs: Reviewed and stable  Complications: No apparent anesthesia complications

## 2013-10-08 NOTE — Progress Notes (Signed)
Patient ID: Bruce Escobar, male   DOB: 05-13-1965, 48 y.o.   MRN: [id number]     Subjective:  Patient reports pain as mild to moderate.  Patient alert and follows commands worried ablut potential lose of limb  Objective:   VITALS:   Filed Vitals:   10/08/13 0250 10/08/13 0350 10/08/13 0450 10/08/13 0543  BP: 103/67 104/64 112/70 118/74  Pulse: 78 89 86 84  Temp: 97.6 F (36.4 C) 97.7 F (36.5 C) 97.4 F (36.3 C) 97.6 F (36.4 C)  TempSrc: Oral Oral Oral Oral  Resp: Height:      Weight:      SpO2: 98% 99% 99% 100%    ABD soft Sensation intact distally Dorsiflexion/Plantar flexion intact Incision: dressing C/D/I and moderate drainage Wound vac in place and active    Lab Results  Component Value Date   WBC 8.2 10/07/2013   HGB 8.2* 10/07/2013   HCT 24.0* 10/07/2013   MCV 93.0 10/07/2013   PLT 224 10/07/2013     Assessment/Plan: 1 Day Post-Op   Active Problems:   Open fracture of tibia and fibula   Open tibial fracture   Advance diet Up with therapy NWB left lower ext Continue wound vac May need further  I&D or plastics consult for open wound   Torrie Mayers, Lekesha Claw 10/08/2013, 7:19 AM   Margarita Rana MD [phone]
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
## 2013-10-09 DIAGNOSIS — D500 Iron deficiency anemia secondary to blood loss (chronic): Secondary | ICD-10-CM | POA: Diagnosis present

## 2013-10-09 LAB — TYPE AND SCREEN
ABO/RH(D): O POS
Antibody Screen: NEGATIVE
UNIT DIVISION: 0
Unit division: 0

## 2013-10-09 MED ORDER — IBUPROFEN 600 MG PO TABS
600.0000 mg | ORAL_TABLET | Freq: Four times a day (QID) | ORAL | Status: DC | PRN
  Administered 2013-10-09: 300 mg via ORAL
  Filled 2013-10-09: qty 1
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
## 2013-10-09 NOTE — Progress Notes (Signed)
Patient requests Advil for headache. RN asked patient about his allergic reaction to ibuprofen and what reaction he gets from ingesting it. Patient told RN that his face will be flushed the next day. Patient agrees to taking Advil and states that he takes Advil at home as well for headaches but forgot to bring his bottle from home so he could have some when he needed it.

## 2013-10-10 LAB — CBC
HCT: 23.1 % — ABNORMAL LOW (ref 39.0–52.0)
HEMOGLOBIN: 7.9 g/dL — AB (ref 13.0–17.0)
MCH: 31.1 pg (ref 26.0–34.0)
MCHC: 34.2 g/dL (ref 30.0–36.0)
MCV: 90.9 fL (ref 78.0–100.0)
Platelets: 108 10*3/uL — ABNORMAL LOW (ref 150–400)
RBC: 2.54 MIL/uL — ABNORMAL LOW (ref 4.22–5.81)
RDW: 13.6 % (ref 11.5–15.5)
WBC: 5.9 10*3/uL (ref 4.0–10.5)

## 2013-10-10 NOTE — Progress Notes (Signed)
Subjective: 3 Days Post-Op Procedure(s) (LRB): INTRAMEDULLARY  NAIL TIBIAL right (Right) Patient reports pain as 3 on 0-10 scale.    Objective: Vital signs in last 24 hours: Temp:  [98.4 F (36.9 C)-99.2 F (37.3 C)] 98.4 F (36.9 C) (09/27 8413) Pulse Rate:  [89-102] 89 (09/27 0614) Resp:  [18] 18 (09/27 0614) BP: (115-138)/(60-69) 115/67 mmHg (09/27 0614) SpO2:  [98 %-100 %] 100 % (09/27 0614)  Intake/Output from previous day: 09/26 0701 - 09/27 0700 In: 960 [P.O.:960] Out: 3210 [Urine:2600; Drains:610] Intake/Output this shift:     Recent Labs  10/07/13 1650 10/07/13 2311 10/07/13 2359 10/08/13 0643 10/10/13 0655  HGB 13.6 8.5* 8.2* 7.8* 7.9*    Recent Labs  10/08/13 0643 10/10/13 0655  WBC 6.1 5.9  RBC 2.50* 2.54*  HCT 22.1* 23.1*  PLT 106* 108*    Recent Labs  10/07/13 1650 10/07/13 2311 10/07/13 2359  NA 137 140 139  K 3.8 4.9 5.4*  CL 104  --   --   CO2 20  --   --   BUN 15  --   --   CREATININE 1.08  --   --   GLUCOSE 129* 157*  --   CALCIUM 8.7  --   --     Recent Labs  10/07/13 1650  INR 1.02    ABD soft Neurovascular intact Sensation intact distally Incision: vac working.   toes swollen  Assessment/Plan: 3 Days Post-Op Procedure(s) (LRB): INTRAMEDULLARY  NAIL TIBIAL right (Right) Active Problems:   Open fracture of tibia and fibula   Open tibial fracture   Posthemorrhagic anemia  Advance diet Up with therapy Anemia has stabilized will follow conservatively.  May consider a shot of procrit to boost RBC production.  Headache improved.  Peripheral edema is decreasing.  Bruce Escobar J 10/10/2013, 10:57 AM

## 2013-10-10 NOTE — Progress Notes (Addendum)
Patient's Wound Vac unable to maintain suction. Multiple attempts at repositioning and sealing, with no success. MD notified- would like to maintain suction for another day. States if needed, ACE wrap can be removed and new wound vac can be reapplied if unable to regain suction.
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
## 2013-10-11 DIAGNOSIS — S82409A Unspecified fracture of shaft of unspecified fibula, initial encounter for closed fracture: Secondary | ICD-10-CM

## 2013-10-11 DIAGNOSIS — S82209A Unspecified fracture of shaft of unspecified tibia, initial encounter for closed fracture: Secondary | ICD-10-CM

## 2013-10-11 MED ORDER — DOCUSATE SODIUM 100 MG PO CAPS: 100.0000 mg | ORAL_CAPSULE | Freq: Two times a day (BID) | ORAL | Status: DC

## 2013-10-11 MED ORDER — ASPIRIN 81 MG PO TABS: 81.0000 mg | ORAL_TABLET | Freq: Every day | ORAL | Status: DC

## 2013-10-11 MED ORDER — ONDANSETRON HCL 4 MG PO TABS: 4.0000 mg | ORAL_TABLET | Freq: Three times a day (TID) | ORAL | Status: DC | PRN

## 2013-10-11 MED ORDER — OXYCODONE-ACETAMINOPHEN 5-325 MG PO TABS: 2.0000 | ORAL_TABLET | ORAL | Status: DC | PRN
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
## 2013-10-11 NOTE — Discharge Instructions (Signed)
No weight on R leg  Elevate leg  Keep your wound covered
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
## 2013-10-11 NOTE — Progress Notes (Signed)
Agree with SPT.    Phares Zaccone, PT [phone]  

## 2013-10-11 NOTE — Progress Notes (Signed)
[facility]  Patient Status: New  [facility] is providing the following services: PT and OT  If patient discharges after hours, please call [phone].   Bruce Escobar 10/11/2013, 2:48 PM

## 2013-10-11 NOTE — Progress Notes (Signed)
Discharge instructions given. Pt verbalized understanding and all questions were answered.  

## 2013-10-11 NOTE — Progress Notes (Signed)
Patient ID: Bruce Escobar, male   DOB: 1965-12-05, 48 y.o.   MRN: [id number]     Subjective:  Patient reports pain as mild to moderate.  Patient remarks that he is improving and is able to ambulate with the walker  Objective:   VITALS:   Filed Vitals:   10/10/13 0614 10/10/13 1300 10/10/13 1943 10/11/13 0516  BP: 115/67 138/67 128/67 110/57  Pulse: 89 101 91 69  Temp: 98.4 F (36.9 C) 98.6 F (37 C) 100.2 F (37.9 C) 98.9 F (37.2 C)  TempSrc: Oral Oral Oral Oral  Resp: Height:      Weight:      SpO2: 100% 100% 100% 99%    ABD soft Sensation intact distally Dorsiflexion/Plantar flexion intact Incision: dressing C/D/I and no drainage Wound vac removed wound clean and dry no sign of infection and open wound looks good   Lab Results  Component Value Date   WBC 5.9 10/10/2013   HGB 7.9* 10/10/2013   HCT 23.1* 10/10/2013   MCV 90.9 10/10/2013   PLT 108* 10/10/2013     Assessment/Plan: 4 Days Post-Op   Active Problems:   Open fracture of tibia and fibula   Open tibial fracture   Posthemorrhagic anemia   Advance diet Up with therapy Wound vac removed  Dry dressing PRN NWB right lower  Plan for CIR prevalon boot     Haskel Khan 10/11/2013, 7:54 AM   Margarita Rana MD [phone]

## 2013-10-13 NOTE — Progress Notes (Signed)
I participated in the care of this patient and agree with the above history, physical and evaluation. I performed a review of the history and a physical exam as detailed   Marrell Dicaprio Daniel Essie Lagunes MD  

## 2013-11-25 ENCOUNTER — Encounter: Payer: Self-pay | Admitting: Neurology

## 2013-11-25 ENCOUNTER — Ambulatory Visit (INDEPENDENT_AMBULATORY_CARE_PROVIDER_SITE_OTHER): Payer: 59 | Admitting: Neurology

## 2013-11-25 VITALS — BP 92/65 | HR 92 | Temp 98.1°F | Ht 72.0 in | Wt 178.0 lb

## 2013-11-25 DIAGNOSIS — R519 Headache, unspecified: Secondary | ICD-10-CM

## 2013-11-25 DIAGNOSIS — Z72820 Sleep deprivation: Secondary | ICD-10-CM

## 2013-11-25 DIAGNOSIS — R51 Headache: Secondary | ICD-10-CM

## 2013-11-25 MED ORDER — GABAPENTIN 100 MG PO CAPS: 100.0000 mg | ORAL_CAPSULE | Freq: Every day | ORAL | Status: AC

## 2013-11-25 MED ORDER — NAPROXEN 375 MG PO TABS
375.0000 mg | ORAL_TABLET | Freq: Two times a day (BID) | ORAL | Status: DC
Start: 2013-11-25 — End: 2014-08-30

## 2013-11-25 NOTE — Patient Instructions (Addendum)
Please remember, common headache triggers are: sleep deprivation, dehydration, overheating, stress, hypoglycemia or skipping meals and blood sugar fluctuations, excessive pain medications or excessive alcohol use or caffeine withdrawal. Some people have food triggers such as aged cheese, orange juice or chocolate, especially dark chocolate, or MSG (monosodium glutamate). Try to avoid these headache triggers as much possible. It may be helpful to keep a headache diary to figure out what makes your headaches worse or brings them on and what alleviates them. Some people report headache onset after exercise but studies have shown that regular exercise may actually prevent headaches from coming. If you have exercise-induced headaches, please make sure that you drink plenty of fluid before and after exercising and that you do not over do it and do not overheat.  You are likely suffering from what we call rebound headaches and medication overuse headaches.   I will prescribe naproxen as needed up to 2 times a day. Take your acid refux pill for stomach protection.   For sleep we will try Neurontin (gabapentin) 100 mg strength: Take 1 pill nightly at bedtime for sleep and to help reduce headaches. The most common side effects reported are sedation or sleepiness. Rare side effects include balance problems, confusion.
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
## 2014-01-25 ENCOUNTER — Ambulatory Visit: Payer: 59 | Admitting: Neurology

## 2014-04-05 ENCOUNTER — Telehealth: Payer: Self-pay | Admitting: *Deleted

## 2014-04-05 NOTE — Telephone Encounter (Signed)
Form on [employer] desk from [employer].

## 2014-04-06 DIAGNOSIS — Z0289 Encounter for other administrative examinations: Secondary | ICD-10-CM

## 2014-08-20 ENCOUNTER — Emergency Department (HOSPITAL_COMMUNITY): Payer: 59

## 2014-08-20 ENCOUNTER — Emergency Department (HOSPITAL_BASED_OUTPATIENT_CLINIC_OR_DEPARTMENT_OTHER)
Admit: 2014-08-20 | Discharge: 2014-08-20 | Disposition: A | Discharge status: Home / Self Care | Payer: 59 | Attending: Emergency Medicine | Admitting: Emergency Medicine

## 2014-08-20 ENCOUNTER — Inpatient Hospital Stay (HOSPITAL_COMMUNITY)
Admission: EM | Admit: 2014-08-20 | Discharge: 2014-08-30 | DRG: 492 | Disposition: A | Discharge status: Home / Self Care | Payer: 59 | Attending: Internal Medicine | Admitting: Internal Medicine

## 2014-08-20 ENCOUNTER — Encounter (HOSPITAL_COMMUNITY): Payer: Self-pay | Admitting: Emergency Medicine

## 2014-08-20 DIAGNOSIS — Z09 Encounter for follow-up examination after completed treatment for conditions other than malignant neoplasm: Secondary | ICD-10-CM

## 2014-08-20 DIAGNOSIS — Z419 Encounter for procedure for purposes other than remedying health state, unspecified: Secondary | ICD-10-CM

## 2014-08-20 DIAGNOSIS — T84622A Infection and inflammatory reaction due to internal fixation device of right tibia, initial encounter: Secondary | ICD-10-CM | POA: Diagnosis present

## 2014-08-20 DIAGNOSIS — E876 Hypokalemia: Secondary | ICD-10-CM | POA: Diagnosis present

## 2014-08-20 DIAGNOSIS — T847XXA Infection and inflammatory reaction due to other internal orthopedic prosthetic devices, implants and grafts, initial encounter: Secondary | ICD-10-CM | POA: Diagnosis not present

## 2014-08-20 DIAGNOSIS — R258 Other abnormal involuntary movements: Secondary | ICD-10-CM | POA: Insufficient documentation

## 2014-08-20 DIAGNOSIS — A4102 Sepsis due to Methicillin resistant Staphylococcus aureus: Secondary | ICD-10-CM | POA: Diagnosis present

## 2014-08-20 DIAGNOSIS — L03031 Cellulitis of right toe: Secondary | ICD-10-CM | POA: Diagnosis present

## 2014-08-20 DIAGNOSIS — L03115 Cellulitis of right lower limb: Secondary | ICD-10-CM | POA: Diagnosis present

## 2014-08-20 DIAGNOSIS — F17220 Nicotine dependence, chewing tobacco, uncomplicated: Secondary | ICD-10-CM | POA: Diagnosis present

## 2014-08-20 DIAGNOSIS — D72829 Elevated white blood cell count, unspecified: Secondary | ICD-10-CM | POA: Diagnosis not present

## 2014-08-20 DIAGNOSIS — S82409B Unspecified fracture of shaft of unspecified fibula, initial encounter for open fracture type I or II: Secondary | ICD-10-CM

## 2014-08-20 DIAGNOSIS — S82201M Unspecified fracture of shaft of right tibia, subsequent encounter for open fracture type I or II with nonunion: Secondary | ICD-10-CM

## 2014-08-20 DIAGNOSIS — M869 Osteomyelitis, unspecified: Secondary | ICD-10-CM | POA: Diagnosis not present

## 2014-08-20 DIAGNOSIS — R7989 Other specified abnormal findings of blood chemistry: Secondary | ICD-10-CM | POA: Diagnosis present

## 2014-08-20 DIAGNOSIS — Z452 Encounter for adjustment and management of vascular access device: Secondary | ICD-10-CM

## 2014-08-20 DIAGNOSIS — M86161 Other acute osteomyelitis, right tibia and fibula: Secondary | ICD-10-CM | POA: Diagnosis not present

## 2014-08-20 DIAGNOSIS — M1990 Unspecified osteoarthritis, unspecified site: Secondary | ICD-10-CM | POA: Diagnosis present

## 2014-08-20 DIAGNOSIS — L0291 Cutaneous abscess, unspecified: Secondary | ICD-10-CM | POA: Diagnosis not present

## 2014-08-20 DIAGNOSIS — T847XXD Infection and inflammatory reaction due to other internal orthopedic prosthetic devices, implants and grafts, subsequent encounter: Secondary | ICD-10-CM | POA: Diagnosis not present

## 2014-08-20 DIAGNOSIS — E872 Acidosis: Secondary | ICD-10-CM

## 2014-08-20 DIAGNOSIS — M79609 Pain in unspecified limb: Secondary | ICD-10-CM

## 2014-08-20 DIAGNOSIS — M79606 Pain in leg, unspecified: Secondary | ICD-10-CM

## 2014-08-20 DIAGNOSIS — Z22322 Carrier or suspected carrier of Methicillin resistant Staphylococcus aureus: Secondary | ICD-10-CM | POA: Diagnosis not present

## 2014-08-20 DIAGNOSIS — Z79899 Other long term (current) drug therapy: Secondary | ICD-10-CM | POA: Diagnosis not present

## 2014-08-20 DIAGNOSIS — S8291XS Unspecified fracture of right lower leg, sequela: Secondary | ICD-10-CM | POA: Diagnosis not present

## 2014-08-20 DIAGNOSIS — L600 Ingrowing nail: Secondary | ICD-10-CM | POA: Diagnosis present

## 2014-08-20 DIAGNOSIS — Z886 Allergy status to analgesic agent status: Secondary | ICD-10-CM | POA: Diagnosis not present

## 2014-08-20 DIAGNOSIS — Z880 Allergy status to penicillin: Secondary | ICD-10-CM | POA: Diagnosis not present

## 2014-08-20 DIAGNOSIS — Z881 Allergy status to other antibiotic agents status: Secondary | ICD-10-CM | POA: Diagnosis not present

## 2014-08-20 DIAGNOSIS — S82201B Unspecified fracture of shaft of right tibia, initial encounter for open fracture type I or II: Secondary | ICD-10-CM | POA: Insufficient documentation

## 2014-08-20 DIAGNOSIS — B9562 Methicillin resistant Staphylococcus aureus infection as the cause of diseases classified elsewhere: Secondary | ICD-10-CM | POA: Diagnosis not present

## 2014-08-20 DIAGNOSIS — Z791 Long term (current) use of non-steroidal anti-inflammatories (NSAID): Secondary | ICD-10-CM

## 2014-08-20 DIAGNOSIS — S82209B Unspecified fracture of shaft of unspecified tibia, initial encounter for open fracture type I or II: Secondary | ICD-10-CM | POA: Diagnosis present

## 2014-08-20 DIAGNOSIS — K219 Gastro-esophageal reflux disease without esophagitis: Secondary | ICD-10-CM | POA: Diagnosis present

## 2014-08-20 DIAGNOSIS — E871 Hypo-osmolality and hyponatremia: Secondary | ICD-10-CM | POA: Diagnosis present

## 2014-08-20 DIAGNOSIS — L03039 Cellulitis of unspecified toe: Secondary | ICD-10-CM | POA: Diagnosis present

## 2014-08-20 DIAGNOSIS — A419 Sepsis, unspecified organism: Secondary | ICD-10-CM | POA: Diagnosis present

## 2014-08-20 DIAGNOSIS — G061 Intraspinal abscess and granuloma: Secondary | ICD-10-CM

## 2014-08-20 DIAGNOSIS — A4902 Methicillin resistant Staphylococcus aureus infection, unspecified site: Secondary | ICD-10-CM | POA: Diagnosis present

## 2014-08-20 LAB — CBC WITH DIFFERENTIAL/PLATELET
BASOS ABS: 0 10*3/uL (ref 0.0–0.1)
BASOS PCT: 0 % (ref 0–1)
EOS ABS: 0 10*3/uL (ref 0.0–0.7)
EOS PCT: 0 % (ref 0–5)
HCT: 40.7 % (ref 39.0–52.0)
HEMOGLOBIN: 14.3 g/dL (ref 13.0–17.0)
LYMPHS ABS: 0.6 10*3/uL — AB (ref 0.7–4.0)
LYMPHS PCT: 3 % — AB (ref 12–46)
MCH: 31.1 pg (ref 26.0–34.0)
MCHC: 35.1 g/dL (ref 30.0–36.0)
MCV: 88.5 fL (ref 78.0–100.0)
MONOS PCT: 11 % (ref 3–12)
Monocytes Absolute: 2.1 10*3/uL — ABNORMAL HIGH (ref 0.1–1.0)
NEUTROS ABS: 16.7 10*3/uL — AB (ref 1.7–7.7)
Neutrophils Relative %: 86 % — ABNORMAL HIGH (ref 43–77)
PLATELETS: 280 10*3/uL (ref 150–400)
RBC: 4.6 MIL/uL (ref 4.22–5.81)
RDW: 14 % (ref 11.5–15.5)
WBC: 19.4 10*3/uL — ABNORMAL HIGH (ref 4.0–10.5)

## 2014-08-20 LAB — BASIC METABOLIC PANEL
Anion gap: 11 (ref 5–15)
BUN: 11 mg/dL (ref 6–20)
CALCIUM: 9.1 mg/dL (ref 8.9–10.3)
CO2: 18 mmol/L — ABNORMAL LOW (ref 22–32)
CREATININE: 1.2 mg/dL (ref 0.61–1.24)
Chloride: 103 mmol/L (ref 101–111)
GFR calc Af Amer: >60 mL/min (ref >60)
GFR calc non Af Amer: >60 mL/min (ref >60)
GLUCOSE: 116 mg/dL — AB (ref 65–99)
POTASSIUM: 3.8 mmol/L (ref 3.5–5.1)
Sodium: 132 mmol/L — ABNORMAL LOW (ref 135–145)

## 2014-08-20 LAB — APTT: APTT: 32 s (ref 24–37)

## 2014-08-20 LAB — PROTIME-INR
INR: 1.41 (ref 0.00–1.49)
Prothrombin Time: 17.3 seconds — ABNORMAL HIGH (ref 11.6–15.2)

## 2014-08-20 LAB — LACTIC ACID, PLASMA: Lactic Acid, Venous: 1.5 mmol/L (ref 0.5–2.0)

## 2014-08-20 LAB — I-STAT CG4 LACTIC ACID, ED: LACTIC ACID, VENOUS: 2.52 mmol/L — AB (ref 0.5–2.0)

## 2014-08-20 LAB — SEDIMENTATION RATE: SED RATE: 42 mm/h — AB (ref 0–16)

## 2014-08-20 LAB — PROCALCITONIN: Procalcitonin: 0.45 ng/mL

## 2014-08-20 LAB — CK: CK TOTAL: 148 U/L (ref 49–397)

## 2014-08-20 LAB — MRSA PCR SCREENING: MRSA by PCR: POSITIVE — AB

## 2014-08-20 MED ORDER — ENOXAPARIN SODIUM 40 MG/0.4ML ~~LOC~~ SOLN
40.0000 mg | SUBCUTANEOUS | Status: DC
  Administered 2014-08-20 – 2014-08-21 (×2): 40 mg via SUBCUTANEOUS
  Filled 2014-08-20 (×2): qty 0.4

## 2014-08-20 MED ORDER — OXYCODONE HCL 5 MG PO TABS
5.0000 mg | ORAL_TABLET | ORAL | Status: DC | PRN
  Administered 2014-08-20 – 2014-08-22 (×6): 5 mg via ORAL
  Filled 2014-08-20 (×6): qty 1

## 2014-08-20 MED ORDER — ACETAMINOPHEN 325 MG PO TABS
650.0000 mg | ORAL_TABLET | Freq: Four times a day (QID) | ORAL | Status: DC | PRN
  Administered 2014-08-21 (×2): 650 mg via ORAL
  Filled 2014-08-20 (×5): qty 2

## 2014-08-20 MED ORDER — ONDANSETRON HCL 4 MG PO TABS: 4.0000 mg | ORAL_TABLET | Freq: Four times a day (QID) | ORAL | Status: DC | PRN

## 2014-08-20 MED ORDER — SODIUM CHLORIDE 0.9 % IV BOLUS (SEPSIS)
500.0000 mL | INTRAVENOUS | Status: AC
  Administered 2014-08-20: 500 mL via INTRAVENOUS

## 2014-08-20 MED ORDER — MUPIROCIN 2 % EX OINT
1.0000 "application " | TOPICAL_OINTMENT | Freq: Two times a day (BID) | CUTANEOUS | Status: AC
  Administered 2014-08-20 – 2014-08-24 (×9): 1 via NASAL
  Filled 2014-08-20: qty 22

## 2014-08-20 MED ORDER — METRONIDAZOLE IN NACL 5-0.79 MG/ML-% IV SOLN
500.0000 mg | Freq: Three times a day (TID) | INTRAVENOUS | Status: DC
  Filled 2014-08-20 (×2): qty 100

## 2014-08-20 MED ORDER — ALUM & MAG HYDROXIDE-SIMETH 200-200-20 MG/5ML PO SUSP: 30.0000 mL | Freq: Four times a day (QID) | ORAL | Status: DC | PRN

## 2014-08-20 MED ORDER — DOCUSATE SODIUM 100 MG PO CAPS
100.0000 mg | ORAL_CAPSULE | Freq: Two times a day (BID) | ORAL | Status: DC
  Administered 2014-08-20 – 2014-08-30 (×18): 100 mg via ORAL
  Filled 2014-08-20 (×17): qty 1

## 2014-08-20 MED ORDER — VANCOMYCIN HCL 10 G IV SOLR
1750.0000 mg | Freq: Once | INTRAVENOUS | Status: AC
  Administered 2014-08-20: 1750 mg via INTRAVENOUS
  Filled 2014-08-20: qty 1750

## 2014-08-20 MED ORDER — VANCOMYCIN HCL IN DEXTROSE 1-5 GM/200ML-% IV SOLN: 1000.0000 mg | Freq: Once | INTRAVENOUS | Status: DC

## 2014-08-20 MED ORDER — HYDROMORPHONE HCL 1 MG/ML IJ SOLN
1.0000 mg | INTRAMUSCULAR | Status: DC | PRN
  Administered 2014-08-20 – 2014-08-30 (×39): 1 mg via INTRAVENOUS
  Filled 2014-08-20 (×35): qty 1

## 2014-08-20 MED ORDER — ACETAMINOPHEN 650 MG RE SUPP: 650.0000 mg | Freq: Four times a day (QID) | RECTAL | Status: DC | PRN

## 2014-08-20 MED ORDER — SODIUM CHLORIDE 0.9 % IV BOLUS (SEPSIS): 1000.0000 mL | INTRAVENOUS | Status: AC

## 2014-08-20 MED ORDER — VANCOMYCIN HCL IN DEXTROSE 1-5 GM/200ML-% IV SOLN
1000.0000 mg | Freq: Three times a day (TID) | INTRAVENOUS | Status: AC
  Administered 2014-08-20 – 2014-08-24 (×12): 1000 mg via INTRAVENOUS
  Filled 2014-08-20 (×13): qty 200

## 2014-08-20 MED ORDER — AZTREONAM 2 G IJ SOLR: 2.0000 g | Freq: Three times a day (TID) | INTRAMUSCULAR | Status: DC

## 2014-08-20 MED ORDER — SODIUM CHLORIDE 0.9 % IV BOLUS (SEPSIS)
1000.0000 mL | Freq: Once | INTRAVENOUS | Status: AC
  Administered 2014-08-20: 1000 mL via INTRAVENOUS

## 2014-08-20 MED ORDER — METRONIDAZOLE IN NACL 5-0.79 MG/ML-% IV SOLN
500.0000 mg | Freq: Three times a day (TID) | INTRAVENOUS | Status: DC
  Administered 2014-08-21: 500 mg via INTRAVENOUS
  Filled 2014-08-20 (×3): qty 100

## 2014-08-20 MED ORDER — HYDROMORPHONE HCL 1 MG/ML IJ SOLN
1.0000 mg | Freq: Once | INTRAMUSCULAR | Status: AC
  Administered 2014-08-20: 1 mg via INTRAVENOUS
  Filled 2014-08-20: qty 1

## 2014-08-20 MED ORDER — METRONIDAZOLE IN NACL 5-0.79 MG/ML-% IV SOLN
500.0000 mg | Freq: Once | INTRAVENOUS | Status: DC
  Administered 2014-08-20: 500 mg via INTRAVENOUS

## 2014-08-20 MED ORDER — FOLIC ACID 5 MG/ML IJ SOLN
1.0000 mg | Freq: Every day | INTRAMUSCULAR | Status: DC
  Administered 2014-08-20 – 2014-08-22 (×3): 1 mg via INTRAVENOUS
  Filled 2014-08-20 (×4): qty 0.2

## 2014-08-20 MED ORDER — SODIUM CHLORIDE 0.9 % IV SOLN
500.0000 mg | Freq: Four times a day (QID) | INTRAVENOUS | Status: DC
  Filled 2014-08-20 (×4): qty 500

## 2014-08-20 MED ORDER — ONDANSETRON HCL 4 MG/2ML IJ SOLN: 4.0000 mg | Freq: Four times a day (QID) | INTRAMUSCULAR | Status: DC | PRN

## 2014-08-20 MED ORDER — DEXTROSE 5 % IV SOLN
2.0000 g | Freq: Three times a day (TID) | INTRAVENOUS | Status: DC
  Administered 2014-08-20 – 2014-08-22 (×6): 2 g via INTRAVENOUS
  Filled 2014-08-20 (×7): qty 2

## 2014-08-20 MED ORDER — IOHEXOL 300 MG/ML  SOLN
100.0000 mL | Freq: Once | INTRAMUSCULAR | Status: AC | PRN
  Administered 2014-08-20: 100 mL via INTRAVENOUS

## 2014-08-20 MED ORDER — SODIUM CHLORIDE 0.9 % IJ SOLN
3.0000 mL | Freq: Two times a day (BID) | INTRAMUSCULAR | Status: DC
  Administered 2014-08-20 – 2014-08-27 (×10): 3 mL via INTRAVENOUS
  Administered 2014-08-28: 22:00:00 via INTRAVENOUS

## 2014-08-20 MED ORDER — DEXTROSE 5 % IV SOLN
2.0000 g | Freq: Three times a day (TID) | INTRAVENOUS | Status: DC
  Filled 2014-08-20 (×2): qty 2

## 2014-08-20 MED ORDER — CHLORHEXIDINE GLUCONATE CLOTH 2 % EX PADS
6.0000 | MEDICATED_PAD | Freq: Every day | CUTANEOUS | Status: AC
Start: 2014-08-21 — End: 2014-08-25
  Administered 2014-08-21 – 2014-08-25 (×5): 6 via TOPICAL

## 2014-08-20 MED ORDER — DEXTROSE 5 % IV SOLN
2.0000 g | Freq: Once | INTRAVENOUS | Status: DC
  Filled 2014-08-20 (×2): qty 2

## 2014-08-20 MED ORDER — THIAMINE HCL 100 MG/ML IJ SOLN
100.0000 mg | Freq: Every day | INTRAMUSCULAR | Status: DC
  Administered 2014-08-20 – 2014-08-22 (×3): 100 mg via INTRAVENOUS
  Filled 2014-08-20 (×4): qty 1

## 2014-08-20 NOTE — Progress Notes (Addendum)
*  Preliminary Results* Right lower extremity venous duplex completed. Right lower extremity is negative for deep vein thrombosis. There is no evidence of right Baker's cyst.  Incidental finding: There is evidence of a heterogenous area of the right groin, suggestive of an enlarged lymph node.  08/20/2014 1:07 PM  Gertie Fey, RVT, RDCS, RDMS

## 2014-08-20 NOTE — ED Notes (Signed)
Pt reports he had orthopedic surgery September 2015 to right lower extremity. RLE began swelling and causing pain at work yesterday at [employer], denies injury. Pt also has redness from knee down ankle. Foot is also swollen with 2+ pitting edema, and cool to touch in compression to left foot.
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
## 2014-08-20 NOTE — Progress Notes (Signed)
Spoke with Dr. Caffrey/orthopedics-updated him on diagnostic findings and initial presentation and current improvement in clinical status regarding normalized lactic acid and normal CK. Patient will be seen in consultation more likely in a.m. givven improvement in clinical status unless patient's status worsens.  Junious Silk, ANP

## 2014-08-20 NOTE — ED Notes (Signed)
Patient transported to vascular. 

## 2014-08-20 NOTE — ED Notes (Addendum)
Male visitor at pt bedside, noted to be wearing a pt gown and going through cabinets and drawers, visitor had pt belonging bag full of guaze, alcohol pads, surgilube, and kling wrap. Male visitor reports that "the lady Dr gave me these items to dress his wounds at home." Pt said to visitor, "I told you not to be going through those things."  Admitting Dr that was at bedside denied changing the settings or authorizing visitor to take items. Security made aware.
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
## 2014-08-20 NOTE — ED Notes (Signed)
Pt out of room for testing. 
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
## 2014-08-20 NOTE — ED Notes (Signed)
Admitting MD at bedside.

## 2014-08-21 DIAGNOSIS — S8291XS Unspecified fracture of right lower leg, sequela: Secondary | ICD-10-CM

## 2014-08-21 LAB — CBC
HEMATOCRIT: 34.3 % — AB (ref 39.0–52.0)
HEMOGLOBIN: 11.5 g/dL — AB (ref 13.0–17.0)
MCH: 31.1 pg (ref 26.0–34.0)
MCHC: 33.5 g/dL (ref 30.0–36.0)
MCV: 92.7 fL (ref 78.0–100.0)
Platelets: 193 10*3/uL (ref 150–400)
RBC: 3.7 MIL/uL — AB (ref 4.22–5.81)
RDW: 14.3 % (ref 11.5–15.5)
WBC: 17.5 10*3/uL — ABNORMAL HIGH (ref 4.0–10.5)

## 2014-08-21 LAB — COMPREHENSIVE METABOLIC PANEL
ALT: 12 U/L — ABNORMAL LOW (ref 17–63)
AST: 18 U/L (ref 15–41)
Albumin: 2.8 g/dL — ABNORMAL LOW (ref 3.5–5.0)
Alkaline Phosphatase: 97 U/L (ref 38–126)
Anion gap: 8 (ref 5–15)
BILIRUBIN TOTAL: 1.5 mg/dL — AB (ref 0.3–1.2)
BUN: 7 mg/dL (ref 6–20)
CO2: 18 mmol/L — ABNORMAL LOW (ref 22–32)
CREATININE: 0.98 mg/dL (ref 0.61–1.24)
Calcium: 8.3 mg/dL — ABNORMAL LOW (ref 8.9–10.3)
Chloride: 106 mmol/L (ref 101–111)
GFR calc Af Amer: >60 mL/min (ref >60)
Glucose, Bld: 106 mg/dL — ABNORMAL HIGH (ref 65–99)
Potassium: 3.6 mmol/L (ref 3.5–5.1)
SODIUM: 132 mmol/L — AB (ref 135–145)
TOTAL PROTEIN: 5.7 g/dL — AB (ref 6.5–8.1)
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
## 2014-08-21 NOTE — Progress Notes (Signed)
Subjective: Right infected tibial nonunion   Procedure(s) (LRB): REMOVAL OF INTRAMEDULLARY TIBIAL NAIL WITH ANTIBIOTIC EXCHANGE NAIL (Right) Patient reports pain as mild.   Comparison of last outpatient films to now showing obvious bone erosion consistent with osteomyelitis and nonunion.  Objective: Vital signs in last 24 hours: Temp:  [97.6 F (36.4 C)-100.4 F (38 C)] 99.1 F (37.3 C) (08/07 2000) Pulse Rate:  [84-107] 90 (08/07 2000) Resp:  [12-21] 21 (08/07 2000) BP: (104-126)/(49-78) 122/67 mmHg (08/07 2000) SpO2:  [97 %-100 %] 97 % (08/07 2000)  Intake/Output from previous day: 08/06 0701 - 08/07 0700 In: 2540 [P.O.:240; I.V.:1000; IV Piggyback:1300] Out: 1325 [Urine:1325] Intake/Output this shift: Total I/O In: 240 [P.O.:240] Out: -    Recent Labs  08/20/14 1130 08/21/14 0254  HGB 14.3 11.5*    Recent Labs  08/20/14 1130 08/21/14 0254  WBC 19.4* 17.5*  RBC 4.60 3.70*  HCT 40.7 34.3*  PLT 280 193    Recent Labs  08/20/14 1130 08/21/14 0254  NA 132* 132*  K 3.8 3.6  CL 103 106  CO2 18* 18*  BUN 11 7  CREATININE 1.20 0.98  GLUCOSE 116* 106*  CALCIUM 9.1 8.3*    Recent Labs  08/20/14 1505  INR 1.41    Intact pulses distally Compartment soft erythema and swelling distal 1/3 right tibia  Assessment/Plan:   Procedure(s) (LRB): REMOVAL OF INTRAMEDULLARY TIBIAL NAIL WITH ANTIBIOTIC EXCHANGE NAIL (Right)  Will speak with Renaye Rakers or Dr. Carola Frost regarding I&D exchange nailing possible tomorrow so will keep NPO after midnight tonight    Natarsha Hurwitz JR,W D 08/21/2014, 8:56 PM

## 2014-08-22 ENCOUNTER — Inpatient Hospital Stay (HOSPITAL_COMMUNITY): Payer: 59 | Admitting: Anesthesiology

## 2014-08-22 ENCOUNTER — Encounter (HOSPITAL_COMMUNITY): Payer: Self-pay | Admitting: Anesthesiology

## 2014-08-22 ENCOUNTER — Inpatient Hospital Stay (HOSPITAL_COMMUNITY): Payer: 59

## 2014-08-22 ENCOUNTER — Encounter (HOSPITAL_COMMUNITY)
Admission: EM | Disposition: A | Discharge status: Home / Self Care | Payer: Self-pay | Source: Home / Self Care | Attending: Internal Medicine

## 2014-08-22 DIAGNOSIS — S82201J Unspecified fracture of shaft of right tibia, subsequent encounter for open fracture type IIIA, IIIB, or IIIC with delayed healing: Secondary | ICD-10-CM

## 2014-08-22 DIAGNOSIS — S82401J Unspecified fracture of shaft of right fibula, subsequent encounter for open fracture type IIIA, IIIB, or IIIC with delayed healing: Secondary | ICD-10-CM

## 2014-08-22 DIAGNOSIS — L03031 Cellulitis of right toe: Secondary | ICD-10-CM

## 2014-08-22 DIAGNOSIS — L03115 Cellulitis of right lower limb: Secondary | ICD-10-CM

## 2014-08-22 DIAGNOSIS — B9689 Other specified bacterial agents as the cause of diseases classified elsewhere: Secondary | ICD-10-CM

## 2014-08-22 DIAGNOSIS — M869 Osteomyelitis, unspecified: Secondary | ICD-10-CM

## 2014-08-22 DIAGNOSIS — L03039 Cellulitis of unspecified toe: Secondary | ICD-10-CM | POA: Diagnosis present

## 2014-08-22 DIAGNOSIS — L0291 Cutaneous abscess, unspecified: Secondary | ICD-10-CM

## 2014-08-22 DIAGNOSIS — E871 Hypo-osmolality and hyponatremia: Secondary | ICD-10-CM

## 2014-08-22 DIAGNOSIS — Z9689 Presence of other specified functional implants: Secondary | ICD-10-CM

## 2014-08-22 DIAGNOSIS — Z22322 Carrier or suspected carrier of Methicillin resistant Staphylococcus aureus: Secondary | ICD-10-CM | POA: Diagnosis present

## 2014-08-22 DIAGNOSIS — T847XXA Infection and inflammatory reaction due to other internal orthopedic prosthetic devices, implants and grafts, initial encounter: Secondary | ICD-10-CM | POA: Diagnosis present

## 2014-08-22 HISTORY — PX: TIBIA IM NAIL INSERTION: SHX2516

## 2014-08-22 LAB — GRAM STAIN

## 2014-08-22 LAB — CBC
HEMATOCRIT: 37.6 % — AB (ref 39.0–52.0)
Hemoglobin: 12.8 g/dL — ABNORMAL LOW (ref 13.0–17.0)
MCH: 30.9 pg (ref 26.0–34.0)
MCHC: 34 g/dL (ref 30.0–36.0)
MCV: 90.8 fL (ref 78.0–100.0)
PLATELETS: 239 10*3/uL (ref 150–400)
RBC: 4.14 MIL/uL — ABNORMAL LOW (ref 4.22–5.81)
RDW: 14 % (ref 11.5–15.5)
WBC: 17.6 10*3/uL — ABNORMAL HIGH (ref 4.0–10.5)

## 2014-08-22 LAB — COMPREHENSIVE METABOLIC PANEL
ALT: 16 U/L — AB (ref 17–63)
ANION GAP: 9 (ref 5–15)
AST: 21 U/L (ref 15–41)
Albumin: 2.5 g/dL — ABNORMAL LOW (ref 3.5–5.0)
Alkaline Phosphatase: 111 U/L (ref 38–126)
BUN: 7 mg/dL (ref 6–20)
CALCIUM: 8.4 mg/dL — AB (ref 8.9–10.3)
CO2: 24 mmol/L (ref 22–32)
CREATININE: 1.15 mg/dL (ref 0.61–1.24)
Chloride: 102 mmol/L (ref 101–111)
GFR calc Af Amer: >60 mL/min (ref >60)
GFR calc non Af Amer: >60 mL/min (ref >60)
Glucose, Bld: 113 mg/dL — ABNORMAL HIGH (ref 65–99)
Potassium: 3.3 mmol/L — ABNORMAL LOW (ref 3.5–5.1)
Sodium: 135 mmol/L (ref 135–145)
Total Bilirubin: 1 mg/dL (ref 0.3–1.2)
Total Protein: 5.9 g/dL — ABNORMAL LOW (ref 6.5–8.1)

## 2014-08-22 LAB — MAGNESIUM: Magnesium: 1.8 mg/dL (ref 1.7–2.4)

## 2014-08-22 LAB — TSH: TSH: 2.402 u[IU]/mL (ref 0.350–4.500)

## 2014-08-22 LAB — VANCOMYCIN, TROUGH: Vancomycin Tr: 7 ug/mL — ABNORMAL LOW (ref 10.0–20.0)

## 2014-08-22 LAB — TRANSFERRIN: Transferrin: 167 mg/dL — ABNORMAL LOW (ref 180–329)

## 2014-08-22 LAB — PREALBUMIN: PREALBUMIN: 5.5 mg/dL — AB (ref 18–38)

## 2014-08-22 LAB — PHOSPHORUS: PHOSPHORUS: 2.5 mg/dL (ref 2.5–4.6)

## 2014-08-22 LAB — C-REACTIVE PROTEIN: CRP: 32.8 mg/dL — AB (ref <1.0)

## 2014-08-22 SURGERY — INSERTION, INTRAMEDULLARY ROD, TIBIA
Anesthesia: General | Site: Leg Lower | Laterality: Right

## 2014-08-22 MED ORDER — MIDAZOLAM HCL 2 MG/2ML IJ SOLN
INTRAMUSCULAR | Status: AC
  Filled 2014-08-22: qty 4

## 2014-08-22 MED ORDER — METHOCARBAMOL 1000 MG/10ML IJ SOLN
1000.0000 mg | Freq: Four times a day (QID) | INTRAVENOUS | Status: DC | PRN
  Administered 2014-08-29: 1000 mg via INTRAVENOUS
  Filled 2014-08-22 (×3): qty 10

## 2014-08-22 MED ORDER — OXYCODONE-ACETAMINOPHEN 5-325 MG PO TABS
1.0000 | ORAL_TABLET | Freq: Four times a day (QID) | ORAL | Status: DC | PRN
  Administered 2014-08-23: 1 via ORAL
  Administered 2014-08-23 – 2014-08-30 (×8): 2 via ORAL
  Filled 2014-08-22 (×10): qty 2

## 2014-08-22 MED ORDER — OXYCODONE HCL 5 MG PO TABS
5.0000 mg | ORAL_TABLET | Freq: Once | ORAL | Status: AC | PRN
  Administered 2014-08-22: 5 mg via ORAL

## 2014-08-22 MED ORDER — VANCOMYCIN HCL 1000 MG IV SOLR
INTRAVENOUS | Status: DC | PRN
  Administered 2014-08-22: 1000 mg

## 2014-08-22 MED ORDER — ONDANSETRON HCL 4 MG PO TABS: 4.0000 mg | ORAL_TABLET | Freq: Four times a day (QID) | ORAL | Status: DC | PRN

## 2014-08-22 MED ORDER — TOBRAMYCIN SULFATE 1.2 G IJ SOLR
INTRAMUSCULAR | Status: DC | PRN
  Administered 2014-08-22: 1.2 g

## 2014-08-22 MED ORDER — LIDOCAINE HCL (CARDIAC) 20 MG/ML IV SOLN
INTRAVENOUS | Status: DC | PRN
  Administered 2014-08-22: 80 mg via INTRAVENOUS

## 2014-08-22 MED ORDER — METOCLOPRAMIDE HCL 5 MG/ML IJ SOLN
5.0000 mg | Freq: Three times a day (TID) | INTRAMUSCULAR | Status: DC | PRN
  Filled 2014-08-22: qty 2

## 2014-08-22 MED ORDER — METHOCARBAMOL 500 MG PO TABS
500.0000 mg | ORAL_TABLET | Freq: Four times a day (QID) | ORAL | Status: DC | PRN
  Administered 2014-08-22 – 2014-08-27 (×4): 500 mg via ORAL
  Administered 2014-08-28 – 2014-08-29 (×3): 1000 mg via ORAL
  Administered 2014-08-30: 500 mg via ORAL
  Filled 2014-08-22: qty 2
  Filled 2014-08-22 (×3): qty 1
  Filled 2014-08-22 (×2): qty 2
  Filled 2014-08-22: qty 1
  Filled 2014-08-22 (×2): qty 2

## 2014-08-22 MED ORDER — FENTANYL CITRATE (PF) 250 MCG/5ML IJ SOLN
INTRAMUSCULAR | Status: AC
  Filled 2014-08-22: qty 5

## 2014-08-22 MED ORDER — ONDANSETRON HCL 4 MG/2ML IJ SOLN: 4.0000 mg | Freq: Four times a day (QID) | INTRAMUSCULAR | Status: DC | PRN

## 2014-08-22 MED ORDER — FENTANYL CITRATE (PF) 100 MCG/2ML IJ SOLN
25.0000 ug | INTRAMUSCULAR | Status: DC | PRN
  Administered 2014-08-22 (×4): 50 ug via INTRAVENOUS

## 2014-08-22 MED ORDER — PROPOFOL 10 MG/ML IV BOLUS
INTRAVENOUS | Status: DC | PRN
  Administered 2014-08-22: 20 mg via INTRAVENOUS
  Administered 2014-08-22: 160 mg via INTRAVENOUS

## 2014-08-22 MED ORDER — ACETAMINOPHEN 650 MG RE SUPP: 650.0000 mg | Freq: Four times a day (QID) | RECTAL | Status: DC | PRN

## 2014-08-22 MED ORDER — OXYCODONE HCL 5 MG/5ML PO SOLN: 5.0000 mg | Freq: Once | ORAL | Status: AC | PRN

## 2014-08-22 MED ORDER — POTASSIUM CHLORIDE IN NACL 20-0.9 MEQ/L-% IV SOLN
INTRAVENOUS | Status: DC
  Administered 2014-08-22 – 2014-08-23 (×2): via INTRAVENOUS
  Filled 2014-08-22 (×3): qty 1000

## 2014-08-22 MED ORDER — SODIUM CHLORIDE 0.9 % IV SOLN
INTRAVENOUS | Status: AC
  Filled 2014-08-22: qty 1000

## 2014-08-22 MED ORDER — LACTATED RINGERS IV SOLN
INTRAVENOUS | Status: DC
  Administered 2014-08-22 – 2014-08-29 (×3): via INTRAVENOUS

## 2014-08-22 MED ORDER — METHOCARBAMOL 500 MG PO TABS
ORAL_TABLET | ORAL | Status: AC
  Filled 2014-08-22: qty 1

## 2014-08-22 MED ORDER — ONDANSETRON HCL 4 MG/2ML IJ SOLN: 4.0000 mg | Freq: Once | INTRAMUSCULAR | Status: DC | PRN

## 2014-08-22 MED ORDER — OXYCODONE HCL 5 MG PO TABS
5.0000 mg | ORAL_TABLET | ORAL | Status: DC | PRN
Start: 2014-08-22 — End: 2014-08-30
  Administered 2014-08-23 – 2014-08-30 (×19): 10 mg via ORAL
  Administered 2014-08-30: 5 mg via ORAL
  Filled 2014-08-22 (×16): qty 2
  Filled 2014-08-22: qty 1
  Filled 2014-08-22 (×2): qty 2

## 2014-08-22 MED ORDER — VANCOMYCIN HCL 1000 MG IV SOLR
INTRAVENOUS | Status: AC
  Filled 2014-08-22: qty 1000

## 2014-08-22 MED ORDER — DOCUSATE SODIUM 100 MG PO CAPS: 100.0000 mg | ORAL_CAPSULE | Freq: Two times a day (BID) | ORAL | Status: DC

## 2014-08-22 MED ORDER — POTASSIUM CHLORIDE CRYS ER 20 MEQ PO TBCR
40.0000 meq | EXTENDED_RELEASE_TABLET | Freq: Once | ORAL | Status: AC
  Administered 2014-08-22: 40 meq via ORAL
  Filled 2014-08-22: qty 2

## 2014-08-22 MED ORDER — FENTANYL CITRATE (PF) 100 MCG/2ML IJ SOLN
INTRAMUSCULAR | Status: DC | PRN
  Administered 2014-08-22 (×2): 50 ug via INTRAVENOUS
  Administered 2014-08-22: 150 ug via INTRAVENOUS
  Administered 2014-08-22: 100 ug via INTRAVENOUS
  Administered 2014-08-22: 50 ug via INTRAVENOUS
  Administered 2014-08-22: 100 ug via INTRAVENOUS

## 2014-08-22 MED ORDER — HYDROMORPHONE HCL 1 MG/ML IJ SOLN
0.5000 mg | INTRAMUSCULAR | Status: DC | PRN
  Administered 2014-08-23: 0.5 mg via INTRAVENOUS
  Administered 2014-08-24 – 2014-08-29 (×2): 1 mg via INTRAVENOUS
  Filled 2014-08-22 (×10): qty 1

## 2014-08-22 MED ORDER — MAGNESIUM CITRATE PO SOLN
1.0000 | Freq: Once | ORAL | Status: DC | PRN
  Filled 2014-08-22: qty 296

## 2014-08-22 MED ORDER — ACETAMINOPHEN 325 MG PO TABS
650.0000 mg | ORAL_TABLET | Freq: Four times a day (QID) | ORAL | Status: DC | PRN
  Administered 2014-08-24 – 2014-08-29 (×2): 650 mg via ORAL

## 2014-08-22 MED ORDER — HYDROMORPHONE HCL 1 MG/ML IJ SOLN
INTRAMUSCULAR | Status: AC
  Filled 2014-08-22: qty 2

## 2014-08-22 MED ORDER — BISACODYL 5 MG PO TBEC: 5.0000 mg | DELAYED_RELEASE_TABLET | Freq: Every day | ORAL | Status: DC | PRN

## 2014-08-22 MED ORDER — DIPHENHYDRAMINE HCL 12.5 MG/5ML PO ELIX
12.5000 mg | ORAL_SOLUTION | ORAL | Status: DC | PRN
  Filled 2014-08-22: qty 10

## 2014-08-22 MED ORDER — TOBRAMYCIN SULFATE 1.2 G IJ SOLR
INTRAMUSCULAR | Status: AC
  Filled 2014-08-22: qty 1.2

## 2014-08-22 MED ORDER — MIDAZOLAM HCL 5 MG/5ML IJ SOLN
INTRAMUSCULAR | Status: DC | PRN
  Administered 2014-08-22: 2 mg via INTRAVENOUS

## 2014-08-22 MED ORDER — ENOXAPARIN SODIUM 40 MG/0.4ML ~~LOC~~ SOLN
40.0000 mg | SUBCUTANEOUS | Status: DC
  Administered 2014-08-23 – 2014-08-30 (×6): 40 mg via SUBCUTANEOUS
  Filled 2014-08-22 (×7): qty 0.4

## 2014-08-22 MED ORDER — PHENYLEPHRINE HCL 10 MG/ML IJ SOLN
INTRAMUSCULAR | Status: DC | PRN
  Administered 2014-08-22 (×3): 80 ug via INTRAVENOUS

## 2014-08-22 MED ORDER — SODIUM CHLORIDE 0.9 % IR SOLN
Status: DC | PRN
  Administered 2014-08-22: 3000 mL

## 2014-08-22 MED ORDER — HYDROMORPHONE HCL 1 MG/ML IJ SOLN
0.5000 mg | INTRAMUSCULAR | Status: DC | PRN
  Administered 2014-08-22 (×2): 0.5 mg via INTRAVENOUS

## 2014-08-22 MED ORDER — POLYETHYLENE GLYCOL 3350 17 G PO PACK
17.0000 g | PACK | Freq: Every day | ORAL | Status: DC
  Administered 2014-08-22 – 2014-08-30 (×7): 17 g via ORAL
  Filled 2014-08-22 (×7): qty 1

## 2014-08-22 MED ORDER — OXYCODONE HCL 5 MG PO TABS
ORAL_TABLET | ORAL | Status: AC
  Filled 2014-08-22: qty 1

## 2014-08-22 MED ORDER — FENTANYL CITRATE (PF) 100 MCG/2ML IJ SOLN
INTRAMUSCULAR | Status: AC
  Filled 2014-08-22: qty 2

## 2014-08-22 MED ORDER — ROCURONIUM BROMIDE 100 MG/10ML IV SOLN
INTRAVENOUS | Status: DC | PRN
  Administered 2014-08-22: 10 mg via INTRAVENOUS
  Administered 2014-08-22: 40 mg via INTRAVENOUS
  Administered 2014-08-22 (×3): 10 mg via INTRAVENOUS

## 2014-08-22 MED ORDER — METOCLOPRAMIDE HCL 5 MG PO TABS
5.0000 mg | ORAL_TABLET | Freq: Three times a day (TID) | ORAL | Status: DC | PRN
  Filled 2014-08-22: qty 2

## 2014-08-22 SURGICAL SUPPLY — 51 items
BANDAGE ELASTIC 4 VELCRO ST LF (GAUZE/BANDAGES/DRESSINGS) ×3 IMPLANT
BANDAGE ELASTIC 6 VELCRO ST LF (GAUZE/BANDAGES/DRESSINGS) ×3 IMPLANT
BLADE SURG 10 STRL SS (BLADE) ×6 IMPLANT
BNDG GAUZE ELAST 4 BULKY (GAUZE/BANDAGES/DRESSINGS) ×3 IMPLANT
BRUSH FEMORAL CANAL (MISCELLANEOUS) ×3 IMPLANT
BRUSH SCRUB DISP (MISCELLANEOUS) ×6 IMPLANT
CLOSURE WOUND 1/2 X4 (GAUZE/BANDAGES/DRESSINGS) ×1
COVER SURGICAL LIGHT HANDLE (MISCELLANEOUS) ×6 IMPLANT
DRAPE C-ARM 42X72 X-RAY (DRAPES) ×3 IMPLANT
DRAPE C-ARMOR (DRAPES) ×3 IMPLANT
DRAPE EXTREMITY T 121X128X90 (DRAPE) ×6 IMPLANT
DRAPE IMP U-DRAPE 54X76 (DRAPES) ×3 IMPLANT
DRAPE INCISE IOBAN 66X45 STRL (DRAPES) IMPLANT
DRAPE U-SHAPE 47X51 STRL (DRAPES) ×3 IMPLANT
DRSG ADAPTIC 3X8 NADH LF (GAUZE/BANDAGES/DRESSINGS) ×3 IMPLANT
ELECT REM PT RETURN 9FT ADLT (ELECTROSURGICAL) ×3
ELECTRODE REM PT RTRN 9FT ADLT (ELECTROSURGICAL) ×1 IMPLANT
EVACUATOR 1/8 PVC DRAIN (DRAIN) IMPLANT
GAUZE SPONGE 4X4 12PLY STRL (GAUZE/BANDAGES/DRESSINGS) ×3 IMPLANT
GLOVE BIO SURGEON STRL SZ7.5 (GLOVE) ×3 IMPLANT
GLOVE BIO SURGEON STRL SZ8 (GLOVE) ×3 IMPLANT
GLOVE BIO SURGEON STRL SZ8.5 (GLOVE) ×3 IMPLANT
GLOVE BIOGEL PI IND STRL 7.5 (GLOVE) ×1 IMPLANT
GLOVE BIOGEL PI IND STRL 8 (GLOVE) ×1 IMPLANT
GLOVE BIOGEL PI INDICATOR 7.5 (GLOVE) ×2
GLOVE BIOGEL PI INDICATOR 8 (GLOVE) ×2
GOWN STRL REUS W/ TWL LRG LVL3 (GOWN DISPOSABLE) ×2 IMPLANT
GOWN STRL REUS W/ TWL XL LVL3 (GOWN DISPOSABLE) ×1 IMPLANT
GOWN STRL REUS W/TWL LRG LVL3 (GOWN DISPOSABLE) ×4
GOWN STRL REUS W/TWL XL LVL3 (GOWN DISPOSABLE) ×2
GUIDEWIRE GAMMA 800 (WIRE) ×3 IMPLANT
KIT BASIN OR (CUSTOM PROCEDURE TRAY) ×3 IMPLANT
KIT ROOM TURNOVER OR (KITS) ×3 IMPLANT
KIT STIMULAN RAPID CURE  10CC (Orthopedic Implant) ×2 IMPLANT
KIT STIMULAN RAPID CURE 10CC (Orthopedic Implant) ×1 IMPLANT
PACK GENERAL/GYN (CUSTOM PROCEDURE TRAY) ×3 IMPLANT
PACK UNIVERSAL I (CUSTOM PROCEDURE TRAY) ×3 IMPLANT
PAD ARMBOARD 7.5X6 YLW CONV (MISCELLANEOUS) ×6 IMPLANT
PAD NEG PRESSURE SENSATRAC (MISCELLANEOUS) ×3 IMPLANT
REAMER SHAFT BIXCUT (INSTRUMENTS) ×3 IMPLANT
STAPLER VISISTAT 35W (STAPLE) ×3 IMPLANT
STRIP CLOSURE SKIN 1/2X4 (GAUZE/BANDAGES/DRESSINGS) ×2 IMPLANT
SUT VIC AB 0 CT1 27 (SUTURE)
SUT VIC AB 0 CT1 27XBRD ANBCTR (SUTURE) IMPLANT
SUT VIC AB 2-0 CT1 27 (SUTURE)
SUT VIC AB 2-0 CT1 TAPERPNT 27 (SUTURE) IMPLANT
SUT VIC AB 2-0 CT3 27 (SUTURE) IMPLANT
TOWEL OR 17X24 6PK STRL BLUE (TOWEL DISPOSABLE) ×3 IMPLANT
TOWEL OR 17X26 10 PK STRL BLUE (TOWEL DISPOSABLE) ×6 IMPLANT
TRAY FOLEY CATH 16FRSI W/METER (SET/KITS/TRAYS/PACK) ×3 IMPLANT
YANKAUER SUCT BULB TIP NO VENT (SUCTIONS) IMPLANT

## 2014-08-22 NOTE — Progress Notes (Signed)
Orthopedic Tech Progress Note Patient Details:  Bruce Escobar March 18, 1965 [id number] Patient's current bed will not support OHF. Will have to switch out to Ortho bed. Patient ID: Bruce Escobar, male   DOB: July 31, 1965, 49 y.o.   MRN: [id number]   Bruce Escobar 08/22/2014, 11:18 PM

## 2014-08-22 NOTE — Anesthesia Postprocedure Evaluation (Signed)
  Anesthesia Post-op Note  Patient: Bruce Escobar  Procedure(s) Performed: Procedure(s): REMOVAL OF INTRAMEDULLARY TIBIAL NAIL, PARTIAL EXCISION OF RIGHT TIBIA, REMOVAL OF NAIL FROM RIGHT GREAT TOE  (Right)  Patient Location: PACU  Anesthesia Type: General   Level of Consciousness: awake, alert  and oriented  Airway and Oxygen Therapy: Patient Spontanous Breathing  Post-op Pain: mild  Post-op Assessment: Post-op Vital signs reviewed  Post-op Vital Signs: Reviewed  Last Vitals:  Filed Vitals:   08/22/14 1930  BP: 119/68  Pulse: 85  Temp:   Resp: 14    Complications: No apparent anesthesia complications

## 2014-08-22 NOTE — Progress Notes (Signed)
UR COMPLETED  

## 2014-08-22 NOTE — Anesthesia Preprocedure Evaluation (Addendum)
Anesthesia Evaluation  Patient identified by MRN, date of birth, ID band Patient awake    Reviewed: Allergy & Precautions, NPO status , Patient's Chart, lab work & pertinent test results  Airway Mallampati: II  TM Distance: >3 FB Neck ROM: Full    Dental  (+) Teeth Intact, Dental Advisory Given   Pulmonary  breath sounds clear to auscultation        Cardiovascular Exercise Tolerance: Good Rhythm:Regular Rate:Normal     Neuro/Psych    GI/Hepatic Neg liver ROS, GERD-  ,  Endo/Other  negative endocrine ROS  Renal/GU negative Renal ROS  negative genitourinary   Musculoskeletal  (+) Arthritis -,   Abdominal   Peds negative pediatric ROS (+)  Hematology negative hematology ROS (+) anemia ,   Anesthesia Other Findings   Reproductive/Obstetrics negative OB ROS                            Lab Results  Component Value Date   WBC 17.6* 08/22/2014   HGB 12.8* 08/22/2014   HCT 37.6* 08/22/2014   MCV 90.8 08/22/2014   PLT 239 08/22/2014   Lab Results  Component Value Date   CREATININE 1.15 08/22/2014   BUN 7 08/22/2014   NA 135 08/22/2014   K 3.3* 08/22/2014   CL 102 08/22/2014   CO2 24 08/22/2014    Anesthesia Physical Anesthesia Plan  ASA: III  Anesthesia Plan: General   Post-op Pain Management:    Induction: Intravenous  Airway Management Planned: Oral ETT  Additional Equipment:   Intra-op Plan:   Post-operative Plan: Extubation in OR  Informed Consent: I have reviewed the patients History and Physical, chart, labs and discussed the procedure including the risks, benefits and alternatives for the proposed anesthesia with the patient or authorized representative who has indicated his/her understanding and acceptance.   Dental advisory given  Plan Discussed with: CRNA and Anesthesiologist  Anesthesia Plan Comments:        Anesthesia Quick Evaluation
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
## 2014-08-22 NOTE — Progress Notes (Signed)
Upon assessment pt. Has erythema and a blister that has since popped with serous fluid oozing out on his R leg.  Pt. Is scheduled for surgery in the AM.  Dressing applied and will continue to monitor site.

## 2014-08-22 NOTE — Brief Op Note (Signed)
08/20/2014 - 08/22/2014  6:34 PM  PATIENT:  Bruce Escobar  49 y.o. male  PRE-OPERATIVE DIAGNOSIS:   1. Right tibia osteomyelitis 2. 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 Loose tibial nail 4. Chronic infection right great toe eponychia  POST-OPERATIVE DIAGNOSIS:  1. Right tibia osteomyelitis 2. 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 Loose tibial nail 4. Chronic infection right great toe eponychia  PROCEDURE:  Procedure(s): 1. PARTIAL EXCISION OF RIGHT TIBIA 2. REMOVAL OF INTRAMEDULLARY TIBIAL NAIL 3. PARTIAL NAIL ABLATION AND PARTIAL EXCISION OF NAIL FROM RIGHT GREAT TOE  (Right) 4. PLACEMENT OF SMALL WOUND VAC 5. PLACEMENT OF ANTIBIOTIC BEADS  SURGEON:  Surgeon(s) and Role:    * Myrene Galas, MD - Primary  PHYSICIAN ASSISTANT: NONE  ANESTHESIA:   general  I/O:  Total I/O In: 750 [I.V.:700; IV Piggyback:50] Out: 975 [Urine:900; Blood:75]  SPECIMEN:  Source of Specimen:  ABSCESS, DEEP TISSUE, AND REAMINGS  DISPOSITION OF SPECIMEN:  To micro  TOURNIQUET:  * No tourniquets in log *  DICTATION: .Other Dictation: Dictation Number [id number]
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
## 2014-08-22 NOTE — Anesthesia Procedure Notes (Signed)
Procedure Name: Intubation Date/Time: 08/22/2014 3:43 PM Performed by: Charm Barges, Lucretia Pendley R Pre-anesthesia Checklist: Patient identified, Timeout performed, Emergency Drugs available, Suction available and Patient being monitored Patient Re-evaluated:Patient Re-evaluated prior to inductionOxygen Delivery Method: Circle system utilized Preoxygenation: Pre-oxygenation with 100% oxygen Intubation Type: IV induction Ventilation: Mask ventilation without difficulty Laryngoscope Size: Miller and 3 Grade View: Grade I Tube type: Oral Tube size: 7.5 mm Number of attempts: 1 Airway Equipment and Method: Stylet Placement Confirmation: ETT inserted through vocal cords under direct vision,  breath sounds checked- equal and bilateral and positive ETCO2 Secured at: 24 cm Tube secured with: Tape Dental Injury: Teeth and Oropharynx as per pre-operative assessment
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
## 2014-08-22 NOTE — Transfer of Care (Signed)
Immediate Anesthesia Transfer of Care Note  Patient: Bruce Escobar  Procedure(s) Performed: Procedure(s): REMOVAL OF INTRAMEDULLARY TIBIAL NAIL, PARTIAL EXCISION OF RIGHT TIBIA, REMOVAL OF NAIL FROM RIGHT GREAT TOE  (Right)  Patient Location: PACU  Anesthesia Type:General  Level of Consciousness: awake, oriented and patient cooperative  Airway & Oxygen Therapy: Patient Spontanous Breathing and Patient connected to nasal cannula oxygen  Post-op Assessment: Report given to RN, Post -op Vital signs reviewed and stable and Patient moving all extremities  Post vital signs: Reviewed and stable  Last Vitals:  Filed Vitals:   08/22/14 0733  BP: 117/72  Pulse: 98  Temp: 37.4 C  Resp: 19    Complications: No apparent anesthesia complications

## 2014-08-23 ENCOUNTER — Encounter (HOSPITAL_COMMUNITY): Payer: Self-pay | Admitting: Orthopedic Surgery

## 2014-08-23 DIAGNOSIS — S82201B Unspecified fracture of shaft of right tibia, initial encounter for open fracture type I or II: Secondary | ICD-10-CM | POA: Insufficient documentation

## 2014-08-23 DIAGNOSIS — T847XXD Infection and inflammatory reaction due to other internal orthopedic prosthetic devices, implants and grafts, subsequent encounter: Secondary | ICD-10-CM

## 2014-08-23 DIAGNOSIS — Z9889 Other specified postprocedural states: Secondary | ICD-10-CM

## 2014-08-23 DIAGNOSIS — M86161 Other acute osteomyelitis, right tibia and fibula: Secondary | ICD-10-CM

## 2014-08-23 LAB — BASIC METABOLIC PANEL
ANION GAP: 7 (ref 5–15)
BUN: 8 mg/dL (ref 6–20)
CALCIUM: 7.9 mg/dL — AB (ref 8.9–10.3)
CHLORIDE: 102 mmol/L (ref 101–111)
CO2: 28 mmol/L (ref 22–32)
Creatinine, Ser: 1.02 mg/dL (ref 0.61–1.24)
Glucose, Bld: 137 mg/dL — ABNORMAL HIGH (ref 65–99)
POTASSIUM: 3.7 mmol/L (ref 3.5–5.1)
SODIUM: 137 mmol/L (ref 135–145)

## 2014-08-23 LAB — CALCIUM, IONIZED: CALCIUM, IONIZED, SERUM: 5.1 mg/dL (ref 4.5–5.6)

## 2014-08-23 LAB — CBC WITH DIFFERENTIAL/PLATELET
Basophils Absolute: 0 10*3/uL (ref 0.0–0.1)
Basophils Relative: 0 % (ref 0–1)
EOS PCT: 2 % (ref 0–5)
Eosinophils Absolute: 0.2 10*3/uL (ref 0.0–0.7)
HCT: 32.2 % — ABNORMAL LOW (ref 39.0–52.0)
HEMOGLOBIN: 10.8 g/dL — AB (ref 13.0–17.0)
Lymphocytes Relative: 5 % — ABNORMAL LOW (ref 12–46)
Lymphs Abs: 0.6 10*3/uL — ABNORMAL LOW (ref 0.7–4.0)
MCH: 30.5 pg (ref 26.0–34.0)
MCHC: 33.5 g/dL (ref 30.0–36.0)
MCV: 91 fL (ref 78.0–100.0)
MONO ABS: 1.2 10*3/uL — AB (ref 0.1–1.0)
Monocytes Relative: 10 % (ref 3–12)
Neutro Abs: 9.3 10*3/uL — ABNORMAL HIGH (ref 1.7–7.7)
Neutrophils Relative %: 83 % — ABNORMAL HIGH (ref 43–77)
PLATELETS: 251 10*3/uL (ref 150–400)
RBC: 3.54 MIL/uL — AB (ref 4.22–5.81)
RDW: 14.1 % (ref 11.5–15.5)
WBC: 11.2 10*3/uL — ABNORMAL HIGH (ref 4.0–10.5)

## 2014-08-23 LAB — HEMOGLOBIN A1C
Hgb A1c MFr Bld: 5.6 % (ref 4.8–5.6)
Mean Plasma Glucose: 114 mg/dL

## 2014-08-23 LAB — HIV ANTIBODY (ROUTINE TESTING W REFLEX): HIV SCREEN 4TH GENERATION: NONREACTIVE

## 2014-08-23 LAB — VITAMIN D 25 HYDROXY (VIT D DEFICIENCY, FRACTURES): Vit D, 25-Hydroxy: 27.9 ng/mL — ABNORMAL LOW (ref 30.0–100.0)

## 2014-08-23 LAB — NICOTINE/COTININE METABOLITES

## 2014-08-23 LAB — PREALBUMIN: Prealbumin: 4.9 mg/dL — ABNORMAL LOW (ref 18–38)

## 2014-08-23 LAB — MAGNESIUM: Magnesium: 2.1 mg/dL (ref 1.7–2.4)

## 2014-08-23 MED ORDER — FOLIC ACID 1 MG PO TABS
1.0000 mg | ORAL_TABLET | Freq: Every day | ORAL | Status: DC
  Administered 2014-08-23 – 2014-08-30 (×6): 1 mg via ORAL
  Filled 2014-08-23 (×6): qty 1

## 2014-08-23 MED ORDER — VITAMIN B-1 100 MG PO TABS
100.0000 mg | ORAL_TABLET | Freq: Every day | ORAL | Status: DC
  Administered 2014-08-23 – 2014-08-30 (×6): 100 mg via ORAL
  Filled 2014-08-23 (×6): qty 1
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
## 2014-08-23 NOTE — Progress Notes (Addendum)
CM consulted for [facility] services,  equipment  and medication needs. Pt with wound vac, @ d/c pt will need order for [facility]  to manage wound vac.Choice given to pt and pt agreed to use  [facility] for [facility] needs.  Per PT/OT, no recommendations are needed for [facility] services , however pt will need rolling walker and is requesting crutches @ discharge. MD order for rolling walker/crutches will be needed to obtain  for pt @ d/c. Pt without any medication concerns. CM to f/u with disposition needs. Gae Gallop RN,BSN,CM [phone]
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
## 2014-08-24 DIAGNOSIS — A4902 Methicillin resistant Staphylococcus aureus infection, unspecified site: Secondary | ICD-10-CM | POA: Diagnosis present

## 2014-08-24 DIAGNOSIS — B957 Other staphylococcus as the cause of diseases classified elsewhere: Secondary | ICD-10-CM

## 2014-08-24 DIAGNOSIS — L03115 Cellulitis of right lower limb: Secondary | ICD-10-CM | POA: Diagnosis present

## 2014-08-24 LAB — CBC WITH DIFFERENTIAL/PLATELET
Basophils Absolute: 0 10*3/uL (ref 0.0–0.1)
Basophils Relative: 0 % (ref 0–1)
Eosinophils Absolute: 0.3 10*3/uL (ref 0.0–0.7)
Eosinophils Relative: 6 % — ABNORMAL HIGH (ref 0–5)
HEMATOCRIT: 29.2 % — AB (ref 39.0–52.0)
Hemoglobin: 9.7 g/dL — ABNORMAL LOW (ref 13.0–17.0)
LYMPHS ABS: 0.8 10*3/uL (ref 0.7–4.0)
LYMPHS PCT: 14 % (ref 12–46)
MCH: 30.6 pg (ref 26.0–34.0)
MCHC: 33.2 g/dL (ref 30.0–36.0)
MCV: 92.1 fL (ref 78.0–100.0)
MONO ABS: 0.8 10*3/uL (ref 0.1–1.0)
Monocytes Relative: 13 % — ABNORMAL HIGH (ref 3–12)
Neutro Abs: 4.2 10*3/uL (ref 1.7–7.7)
Neutrophils Relative %: 67 % (ref 43–77)
Platelets: 244 10*3/uL (ref 150–400)
RBC: 3.17 MIL/uL — AB (ref 4.22–5.81)
RDW: 14.2 % (ref 11.5–15.5)
WBC: 6.1 10*3/uL (ref 4.0–10.5)

## 2014-08-24 LAB — VANCOMYCIN, TROUGH: VANCOMYCIN TR: 13 ug/mL (ref 10.0–20.0)

## 2014-08-24 LAB — BASIC METABOLIC PANEL
Anion gap: 6 (ref 5–15)
BUN: <5 mg/dL — ABNORMAL LOW (ref 6–20)
CHLORIDE: 105 mmol/L (ref 101–111)
CO2: 28 mmol/L (ref 22–32)
Calcium: 8 mg/dL — ABNORMAL LOW (ref 8.9–10.3)
Creatinine, Ser: 0.93 mg/dL (ref 0.61–1.24)
GFR calc Af Amer: >60 mL/min (ref >60)
GFR calc non Af Amer: >60 mL/min (ref >60)
Glucose, Bld: 156 mg/dL — ABNORMAL HIGH (ref 65–99)
POTASSIUM: 3.5 mmol/L (ref 3.5–5.1)
SODIUM: 139 mmol/L (ref 135–145)

## 2014-08-24 LAB — HEMOGLOBIN A1C
Hgb A1c MFr Bld: 5.7 % — ABNORMAL HIGH (ref 4.8–5.6)
Mean Plasma Glucose: 117 mg/dL

## 2014-08-24 LAB — HEPATITIS C ANTIBODY (REFLEX): HCV Ab: <0.1 s/co ratio (ref 0.0–0.9)

## 2014-08-24 LAB — HCV COMMENT:

## 2014-08-24 MED ORDER — POTASSIUM CHLORIDE CRYS ER 20 MEQ PO TBCR
40.0000 meq | EXTENDED_RELEASE_TABLET | Freq: Once | ORAL | Status: AC
  Administered 2014-08-24: 40 meq via ORAL
  Filled 2014-08-24: qty 2

## 2014-08-24 MED ORDER — VANCOMYCIN HCL IN DEXTROSE 1-5 GM/200ML-% IV SOLN
1000.0000 mg | INTRAVENOUS | Status: DC
  Filled 2014-08-24: qty 200

## 2014-08-24 MED ORDER — VANCOMYCIN HCL 10 G IV SOLR
1250.0000 mg | Freq: Three times a day (TID) | INTRAVENOUS | Status: DC
  Filled 2014-08-24 (×2): qty 1250

## 2014-08-24 NOTE — Progress Notes (Signed)
RN called [facility] for surgical consent order from [facility], [place]. Nursing will continue to monitor.

## 2014-08-24 NOTE — Progress Notes (Signed)
[facility] ([facility])  Subjective: 2 Days Post-Op Procedure(s) (LRB): REMOVAL OF INTRAMEDULLARY TIBIAL NAIL, PARTIAL EXCISION OF RIGHT TIBIA, REMOVAL OF NAIL FROM RIGHT GREAT TOE  (Right) Patient reports pain as mild.   Seen yesterday late pm and this am patient is sleeping.  Vac holding.  Objective: Current Vitals Blood pressure 112/73, pulse 72, temperature 98.8 F (37.1 C), temperature source Oral, resp. rate 19, height 6' (1.829 m), weight 178 lb 5.6 oz (80.9 kg), SpO2 96 %. Vital signs in last 24 hours: Temp:  [98.3 F (36.8 C)-99.2 F (37.3 C)] 98.8 F (37.1 C) (08/10 0400) Pulse Rate:  [72] 72 (08/09 1546) Resp:  [12-20] 19 (08/10 0400) BP: (100-123)/(53-82) 112/73 mmHg (08/10 0400) SpO2:  [96 %-98 %] 96 % (08/10 0400)  Intake/Output from previous day: 08/09 0701 - 08/10 0700 In: 3120 [P.O.:1320; I.V.:1600; IV Piggyback:200] Out: 4025 [Urine:3925; Drains:100]  LABS  Recent Labs  08/22/14 0230 08/23/14 0304 08/24/14 0520  HGB 12.8* 10.8* 9.7*    Recent Labs  08/23/14 0304 08/24/14 0520  WBC 11.2* 6.1  RBC 3.54* 3.17*  HCT 32.2* 29.2*  PLT 251 244    Recent Labs  08/23/14 0304 08/24/14 0520  NA 137 139  K 3.7 3.5  CL 102 105  CO2 28 28  BUN 8 <5*  CREATININE 1.02 0.93  GLUCOSE 137* 156*  CALCIUM 7.9* 8.0*   No results for input(s): LABPT, INR in the last 72 hours.   Physical Exam Buttock pustules resolving.  LLE Vac holding and in place, dressing dry  Sens DPN, SPN, TN intact  Motor EHL, ext, flex, evers 5/5  Toes pink and warm without significant edema  Assessment/Plan: 2 Days Post-Op Procedure(s) (LRB): REMOVAL OF INTRAMEDULLARY TIBIAL NAIL, PARTIAL EXCISION OF RIGHT TIBIA, REMOVAL OF NAIL FROM RIGHT GREAT TOE  (Right)  Plan return to the OR tomorrow for second and last debridement at this stage.  Will place antibiotic impregnated cement nail for structural stability and treatment of infection. Patient may need a free flap  with interval placement of vac vs incisional vac with closure.  PT with NWB, AROM, AAROM, PROM of knee PICC Difficult to predict timing of d/c at this time as depends on wound closure   Myrene Galas, MD [facility] [phone] (p)   08/24/2014, 7:53 AM

## 2014-08-24 NOTE — Progress Notes (Signed)
ANTIBIOTIC CONSULT NOTE - FOLLOW UP  Pharmacy Consult for Vancomycin Indication: osteomylitis  Allergies  Allergen Reactions  . Flagyl [Metronidazole]     Face flush  . Ibuprofen     Face Flush  . Penicillins     Rash    Patient Measurements: Height: 6' (182.9 cm) Weight: 178 lb 5.6 oz (80.9 kg) IBW/kg (Calculated) : 77.6  Vital Signs: Temp: 99.6 F (37.6 C) (08/10 1205) Temp Source: Oral (08/10 1205) BP: 122/99 mmHg (08/10 1630) Intake/Output from previous day: 08/09 0701 - 08/10 0700 In: 3120 [P.O.:1320; I.V.:1600; IV Piggyback:200] Out: 4025 [Urine:3925; Drains:100] Intake/Output from this shift: Total I/O In: 1080 [P.O.:480; IV Piggyback:600] Out: 1175 [Urine:1175]  Labs:  Recent Labs  08/22/14 0230 08/23/14 0304 08/24/14 0520  WBC 17.6* 11.2* 6.1  HGB 12.8* 10.8* 9.7*  PLT 239 251 244  CREATININE 1.15 1.02 0.93   Estimated Creatinine Clearance: 107.8 mL/min (by C-G formula based on Cr of 0.93).  Recent Labs  08/22/14 2144 08/24/14 0520  VANCOTROUGH 7* 13     Microbiology: Recent Results (from the past 720 hour(s))  Culture, blood (x 2)     Status: None (Preliminary result)   Collection Time: 08/20/14  3:00 PM  Result Value Ref Range Status   Specimen Description BLOOD LEFT ARM  Final   Special Requests   Final    BOTTLES DRAWN AEROBIC AND ANAEROBIC RED 5CC BLUE 10CC   Culture NO GROWTH 4 DAYS  Final   Report Status PENDING  Incomplete  Culture, blood (x 2)     Status: None (Preliminary result)   Collection Time: 08/20/14  3:17 PM  Result Value Ref Range Status   Specimen Description BLOOD LEFT HAND  Final   Special Requests BOTTLES DRAWN AEROBIC ONLY 10CC  Final   Culture NO GROWTH 4 DAYS  Final   Report Status PENDING  Incomplete  MRSA PCR Screening     Status: Abnormal   Collection Time: 08/20/14  4:41 PM  Result Value Ref Range Status   MRSA by PCR POSITIVE (A) NEGATIVE Final    Comment:        The GeneXpert MRSA Assay  (FDA approved for NASAL specimens only), is one component of a comprehensive MRSA colonization surveillance program. It is not intended to diagnose MRSA infection nor to guide or monitor treatment for MRSA infections. RESULT CALLED TO, READ BACK BY AND VERIFIED WITH: EVERETT,D RN 08/20/14 1945 WOOTEN,K   Anaerobic culture     Status: None (Preliminary result)   Collection Time: 08/22/14  4:24 PM  Result Value Ref Range Status   Specimen Description ABSCESS RIGHT LEG  Final   Special Requests NONE  Final   Gram Stain   Final    FEW WBC PRESENT, PREDOMINANTLY PMN NO SQUAMOUS EPITHELIAL CELLS SEEN RARE GRAM POSITIVE COCCI IN PAIRS Performed at [facility]    Culture   Final    NO ANAEROBES ISOLATED; CULTURE IN PROGRESS FOR 5 DAYS Performed at [facility]    Report Status PENDING  Incomplete  Gram stain     Status: None   Collection Time: 08/22/14  4:24 PM  Result Value Ref Range Status   Specimen Description ABSCESS RIGHT LEG  Final   Special Requests NONE  Final   Gram Stain   Final    ABUNDANT DEGENERATED CELLULAR MATERIAL PRESENT MODERATE WBC PRESENT,BOTH PMN AND MONONUCLEAR ABUNDANT GRAM POSITIVE COCCI IN PAIRS IN CLUSTERS    Report Status 08/22/2014 FINAL  Final  Culture, routine-abscess     Status: None (Preliminary result)   Collection Time: 08/22/14  4:24 PM  Result Value Ref Range Status   Specimen Description ABSCESS RIGHT LEG  Final   Special Requests NONE  Final   Gram Stain   Final    MODERATE WBC PRESENT,BOTH PMN AND MONONUCLEAR NO SQUAMOUS EPITHELIAL CELLS SEEN RARE GRAM POSITIVE COCCI IN PAIRS Performed at [facility]    Culture   Final    ABUNDANT STAPHYLOCOCCUS AUREUS Note: RIFAMPIN AND GENTAMICIN SHOULD NOT BE USED AS SINGLE DRUGS FOR TREATMENT OF STAPH INFECTIONS. Performed at [facility]    Report Status PENDING  Incomplete  Tissue culture     Status: None (Preliminary result)   Collection Time: 08/22/14   4:36 PM  Result Value Ref Range Status   Specimen Description TISSUE RIGHT TIBIA  Final   Special Requests SPEC B  Final   Gram Stain   Final    MODERATE WBC PRESENT, PREDOMINANTLY PMN NO ORGANISMS SEEN Performed at [facility]    Culture   Final    FEW STAPHYLOCOCCUS AUREUS Note: RIFAMPIN AND GENTAMICIN SHOULD NOT BE USED AS SINGLE DRUGS FOR TREATMENT OF STAPH INFECTIONS. Performed at [facility]    Report Status PENDING  Incomplete  Tissue culture     Status: None (Preliminary result)   Collection Time: 08/22/14  5:17 PM  Result Value Ref Range Status   Specimen Description TISSUE  Final   Special Requests RIGHT TIBIA C  Final   Gram Stain   Final    FEW WBC PRESENT, PREDOMINANTLY PMN NO ORGANISMS SEEN Performed at [facility]    Culture   Final    Culture reincubated for better growth Performed at [facility]    Report Status PENDING  Incomplete    Anti-infectives    Start     Dose/Rate Route Frequency Ordered Stop   08/25/14 0730  vancomycin (VANCOCIN) IVPB 1000 mg/200 mL premix     1,000 mg 200 mL/hr over 60 Minutes Intravenous To [facility] 08/24/14 1100 08/26/14 0730   08/24/14 1045  vancomycin (VANCOCIN) IVPB 1000 mg/200 mL premix  Status:  Discontinued     1,000 mg 200 mL/hr over 60 Minutes Intravenous To [facility] 08/24/14 1031 08/24/14 1100   08/22/14 1620  tobramycin (NEBCIN) powder  Status:  Discontinued       As needed 08/22/14 1620 08/22/14 1827   08/22/14 1620  vancomycin (VANCOCIN) powder  Status:  Discontinued       As needed 08/22/14 1620 08/22/14 1827   08/20/14 2200  aztreonam (AZACTAM) 2 g in dextrose 5 % 50 mL IVPB  Status:  Discontinued     2 g 100 mL/hr over 30 Minutes Intravenous 3 times per day 08/20/14 1459 08/20/14 1831   08/20/14 2200  metroNIDAZOLE (FLAGYL) IVPB 500 mg  Status:  Discontinued     500 mg 100 mL/hr over 60 Minutes Intravenous Every 8 hours 08/20/14 1459 08/20/14 1831    08/20/14 2200  vancomycin (VANCOCIN) IVPB 1000 mg/200 mL premix     1,000 mg 200 mL/hr over 60 Minutes Intravenous Every 8 hours 08/20/14 1459 08/24/14 2359   08/20/14 2200  aztreonam (AZACTAM) injection 2 g  Status:  Discontinued     2 g Intramuscular 3 times per day 08/20/14 1845 08/20/14 1851   08/20/14 1900  aztreonam (AZACTAM) 2 g in dextrose 5 % 50 mL IVPB  Status:  Discontinued     2 g 100 mL/hr over 30 Minutes Intravenous Every 8 hours 08/20/14 1852 08/22/14 1343   08/20/14 1515  aztreonam (AZACTAM) 2 g in dextrose 5 % 50 mL IVPB  Status:  Discontinued     2 g 100 mL/hr over 30 Minutes Intravenous  Once 08/20/14 1443 08/20/14 1831   08/20/14 1445  metroNIDAZOLE (FLAGYL) IVPB 500 mg  Status:  Discontinued     500 mg 100 mL/hr over 60 Minutes Intravenous  Once 08/20/14 1443 08/20/14 1831   08/20/14 1445  vancomycin (VANCOCIN) IVPB 1000 mg/200 mL premix  Status:  Discontinued     1,000 mg 200 mL/hr over 60 Minutes Intravenous  Once 08/20/14 1443 08/20/14 1449   08/20/14 1400  imipenem-cilastatin (PRIMAXIN) 500 mg in sodium chloride 0.9 % 100 mL IVPB  Status:  Discontinued     500 mg 200 mL/hr over 30 Minutes Intravenous 4 times per day 08/20/14 1314 08/20/14 1459   08/20/14 1330  vancomycin (VANCOCIN) 1,750 mg in sodium chloride 0.9 % 500 mL IVPB     1,750 mg 250 mL/hr over 120 Minutes Intravenous  Once 08/20/14 1149 08/20/14 1525   08/20/14 0100  metroNIDAZOLE (FLAGYL) IVPB 500 mg  Status:  Discontinued     500 mg 100 mL/hr over 60 Minutes Intravenous Every 8 hours 08/20/14 1848 08/21/14 0846      Assessment: 49 yo M undergoing 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  Vancomycin trough today on 1gm IV q8h dose was 13 (goal 15-20).  Noted plans for OR tomorrow with pre-op Vancomycin ordered.  Will adjust dose to target higher trough and start after OR tomorrow.  Goal of Therapy:  Vancomycin trough level 15-20 mcg/ml  Plan:  Vancomycin 1250 mg IV q8h - to start  8/11 at 1400 Follow-up clinical progression Repeat trough at steady state  [employer], Pharm.D., BCPS Clinical Pharmacist Pager [phone] 08/24/2014 5:08 PM
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
## 2014-08-25 ENCOUNTER — Inpatient Hospital Stay (HOSPITAL_COMMUNITY): Payer: 59

## 2014-08-25 ENCOUNTER — Encounter (HOSPITAL_COMMUNITY)
Admission: EM | Disposition: A | Discharge status: Home / Self Care | Payer: Self-pay | Source: Home / Self Care | Attending: Internal Medicine

## 2014-08-25 ENCOUNTER — Inpatient Hospital Stay (HOSPITAL_COMMUNITY): Payer: 59 | Admitting: Anesthesiology

## 2014-08-25 DIAGNOSIS — A4902 Methicillin resistant Staphylococcus aureus infection, unspecified site: Secondary | ICD-10-CM

## 2014-08-25 DIAGNOSIS — B9562 Methicillin resistant Staphylococcus aureus infection as the cause of diseases classified elsewhere: Secondary | ICD-10-CM

## 2014-08-25 DIAGNOSIS — T847XXA Infection and inflammatory reaction due to other internal orthopedic prosthetic devices, implants and grafts, initial encounter: Secondary | ICD-10-CM

## 2014-08-25 HISTORY — PX: APPLICATION OF WOUND VAC: SHX5189

## 2014-08-25 HISTORY — PX: INCISION AND DRAINAGE OF WOUND: SHX1803

## 2014-08-25 LAB — TISSUE CULTURE

## 2014-08-25 LAB — VITAMIN B12: VITAMIN B 12: 443 pg/mL (ref 180–914)

## 2014-08-25 LAB — CULTURE, BLOOD (ROUTINE X 2)
Culture: NO GROWTH
Culture: NO GROWTH

## 2014-08-25 LAB — CBC
HCT: 32.4 % — ABNORMAL LOW (ref 39.0–52.0)
Hemoglobin: 10.9 g/dL — ABNORMAL LOW (ref 13.0–17.0)
MCH: 30.9 pg (ref 26.0–34.0)
MCHC: 33.6 g/dL (ref 30.0–36.0)
MCV: 91.8 fL (ref 78.0–100.0)
Platelets: 308 10*3/uL (ref 150–400)
RBC: 3.53 MIL/uL — ABNORMAL LOW (ref 4.22–5.81)
RDW: 14.1 % (ref 11.5–15.5)
WBC: 6.3 10*3/uL (ref 4.0–10.5)

## 2014-08-25 LAB — IRON AND TIBC
Iron: 74 ug/dL (ref 45–182)
Saturation Ratios: 29 % (ref 17.9–39.5)
TIBC: 256 ug/dL (ref 250–450)
UIBC: 182 ug/dL

## 2014-08-25 LAB — CULTURE, ROUTINE-ABSCESS

## 2014-08-25 LAB — BASIC METABOLIC PANEL
ANION GAP: 8 (ref 5–15)
BUN: 6 mg/dL (ref 6–20)
CALCIUM: 8.8 mg/dL — AB (ref 8.9–10.3)
CO2: 28 mmol/L (ref 22–32)
CREATININE: 0.89 mg/dL (ref 0.61–1.24)
Chloride: 104 mmol/L (ref 101–111)
GFR calc Af Amer: >60 mL/min (ref >60)
GLUCOSE: 112 mg/dL — AB (ref 65–99)
Potassium: 4.3 mmol/L (ref 3.5–5.1)
SODIUM: 140 mmol/L (ref 135–145)

## 2014-08-25 LAB — FOLATE: FOLATE: 21.1 ng/mL (ref >5.9)

## 2014-08-25 LAB — FERRITIN: Ferritin: 117 ng/mL (ref 24–336)

## 2014-08-25 LAB — VITAMIN D 1,25 DIHYDROXY
VITAMIN D 1, 25 (OH) TOTAL: 14 pg/mL
Vitamin D2 1, 25 (OH)2: <10 pg/mL
Vitamin D3 1, 25 (OH)2: 14 pg/mL

## 2014-08-25 SURGERY — IRRIGATION AND DEBRIDEMENT WOUND
Site: Leg Lower | Laterality: Right

## 2014-08-25 MED ORDER — NEOSTIGMINE METHYLSULFATE 10 MG/10ML IV SOLN
INTRAVENOUS | Status: DC | PRN
  Administered 2014-08-25: 4 mg via INTRAVENOUS

## 2014-08-25 MED ORDER — LIDOCAINE HCL (CARDIAC) 20 MG/ML IV SOLN
INTRAVENOUS | Status: AC
  Filled 2014-08-25: qty 5

## 2014-08-25 MED ORDER — MIDAZOLAM HCL 2 MG/2ML IJ SOLN
INTRAMUSCULAR | Status: AC
  Filled 2014-08-25: qty 4

## 2014-08-25 MED ORDER — VANCOMYCIN HCL 1000 MG IV SOLR
INTRAVENOUS | Status: AC
  Filled 2014-08-25: qty 1000

## 2014-08-25 MED ORDER — LIDOCAINE HCL (CARDIAC) 20 MG/ML IV SOLN
INTRAVENOUS | Status: DC | PRN
  Administered 2014-08-25: 40 mg via INTRAVENOUS

## 2014-08-25 MED ORDER — VANCOMYCIN HCL 10 G IV SOLR
1250.0000 mg | Freq: Three times a day (TID) | INTRAVENOUS | Status: DC
  Administered 2014-08-25 – 2014-08-27 (×6): 1250 mg via INTRAVENOUS
  Filled 2014-08-25 (×9): qty 1250

## 2014-08-25 MED ORDER — ONDANSETRON HCL 4 MG/2ML IJ SOLN
INTRAMUSCULAR | Status: DC | PRN
  Administered 2014-08-25: 4 mg via INTRAVENOUS

## 2014-08-25 MED ORDER — MEPERIDINE HCL 25 MG/ML IJ SOLN: 6.2500 mg | INTRAMUSCULAR | Status: DC | PRN

## 2014-08-25 MED ORDER — MIDAZOLAM HCL 2 MG/2ML IJ SOLN
0.5000 mg | Freq: Once | INTRAMUSCULAR | Status: AC | PRN
  Administered 2014-08-25: 0.5 mg via INTRAVENOUS

## 2014-08-25 MED ORDER — LORAZEPAM 2 MG/ML IJ SOLN
INTRAMUSCULAR | Status: AC
  Filled 2014-08-25: qty 1

## 2014-08-25 MED ORDER — 0.9 % SODIUM CHLORIDE (POUR BTL) OPTIME
TOPICAL | Status: DC | PRN
  Administered 2014-08-25 (×3): 1000 mL

## 2014-08-25 MED ORDER — GLYCOPYRROLATE 0.2 MG/ML IJ SOLN
INTRAMUSCULAR | Status: AC
  Filled 2014-08-25: qty 4

## 2014-08-25 MED ORDER — ROCURONIUM BROMIDE 50 MG/5ML IV SOLN
INTRAVENOUS | Status: AC
  Filled 2014-08-25: qty 2

## 2014-08-25 MED ORDER — PROMETHAZINE HCL 25 MG/ML IJ SOLN: 6.2500 mg | INTRAMUSCULAR | Status: DC | PRN

## 2014-08-25 MED ORDER — VANCOMYCIN HCL 10 G IV SOLR
1250.0000 mg | Freq: Three times a day (TID) | INTRAVENOUS | Status: DC
  Filled 2014-08-25 (×3): qty 1250

## 2014-08-25 MED ORDER — ONDANSETRON HCL 4 MG/2ML IJ SOLN
INTRAMUSCULAR | Status: AC
  Filled 2014-08-25: qty 2

## 2014-08-25 MED ORDER — HYDROMORPHONE HCL 1 MG/ML IJ SOLN
INTRAMUSCULAR | Status: AC
  Filled 2014-08-25: qty 1

## 2014-08-25 MED ORDER — LORAZEPAM 2 MG/ML IJ SOLN
1.0000 mg | Freq: Once | INTRAMUSCULAR | Status: AC
  Administered 2014-08-25: 1 mg via INTRAVENOUS

## 2014-08-25 MED ORDER — FENTANYL CITRATE (PF) 250 MCG/5ML IJ SOLN
INTRAMUSCULAR | Status: AC
  Filled 2014-08-25: qty 5

## 2014-08-25 MED ORDER — GLYCOPYRROLATE 0.2 MG/ML IJ SOLN
INTRAMUSCULAR | Status: DC | PRN
  Administered 2014-08-25: 0.6 mg via INTRAVENOUS

## 2014-08-25 MED ORDER — ROCURONIUM BROMIDE 100 MG/10ML IV SOLN
INTRAVENOUS | Status: DC | PRN
  Administered 2014-08-25: 50 mg via INTRAVENOUS
  Administered 2014-08-25: 30 mg via INTRAVENOUS

## 2014-08-25 MED ORDER — MIDAZOLAM HCL 5 MG/5ML IJ SOLN
INTRAMUSCULAR | Status: DC | PRN
  Administered 2014-08-25: 2 mg via INTRAVENOUS

## 2014-08-25 MED ORDER — TOBRAMYCIN SULFATE 1.2 G IJ SOLR
INTRAMUSCULAR | Status: AC
Start: 2014-08-25 — End: 2014-08-25
  Filled 2014-08-25: qty 1.2

## 2014-08-25 MED ORDER — PROPOFOL 10 MG/ML IV BOLUS
INTRAVENOUS | Status: DC | PRN
  Administered 2014-08-25: 200 mg via INTRAVENOUS

## 2014-08-25 MED ORDER — FENTANYL CITRATE (PF) 100 MCG/2ML IJ SOLN
INTRAMUSCULAR | Status: DC | PRN
Start: 2014-08-25 — End: 2014-08-25
  Administered 2014-08-25: 100 ug via INTRAVENOUS
  Administered 2014-08-25: 250 ug via INTRAVENOUS
  Administered 2014-08-25: 50 ug via INTRAVENOUS
  Administered 2014-08-25: 100 ug via INTRAVENOUS
  Administered 2014-08-25 (×3): 50 ug via INTRAVENOUS

## 2014-08-25 MED ORDER — VANCOMYCIN HCL 1000 MG IV SOLR
INTRAVENOUS | Status: DC | PRN
  Administered 2014-08-25: 1000 mg

## 2014-08-25 MED ORDER — PROPOFOL 10 MG/ML IV BOLUS
INTRAVENOUS | Status: AC
  Filled 2014-08-25: qty 20

## 2014-08-25 MED ORDER — HYDROMORPHONE HCL 1 MG/ML IJ SOLN
0.2500 mg | INTRAMUSCULAR | Status: DC | PRN
  Administered 2014-08-25 (×2): 0.5 mg via INTRAVENOUS

## 2014-08-25 MED ORDER — TOBRAMYCIN SULFATE 1.2 G IJ SOLR
INTRAMUSCULAR | Status: DC | PRN
  Administered 2014-08-25: 1.2 g

## 2014-08-25 MED ORDER — MIDAZOLAM HCL 2 MG/2ML IJ SOLN
INTRAMUSCULAR | Status: AC
  Filled 2014-08-25: qty 2

## 2014-08-25 MED ORDER — LACTATED RINGERS IV SOLN
INTRAVENOUS | Status: DC | PRN
  Administered 2014-08-25 (×2): via INTRAVENOUS

## 2014-08-25 SURGICAL SUPPLY — 65 items
BANDAGE ELASTIC 4 VELCRO ST LF (GAUZE/BANDAGES/DRESSINGS) ×4 IMPLANT
BANDAGE ELASTIC 6 VELCRO ST LF (GAUZE/BANDAGES/DRESSINGS) ×4 IMPLANT
BANDAGE ESMARK 6X9 LF (GAUZE/BANDAGES/DRESSINGS) IMPLANT
BLADE SURG 10 STRL SS (BLADE) ×8 IMPLANT
BNDG COHESIVE 4X5 TAN STRL (GAUZE/BANDAGES/DRESSINGS) ×4 IMPLANT
BNDG ESMARK 6X9 LF (GAUZE/BANDAGES/DRESSINGS)
BNDG GAUZE ELAST 4 BULKY (GAUZE/BANDAGES/DRESSINGS) ×4 IMPLANT
BRUSH SCRUB DISP (MISCELLANEOUS) ×8 IMPLANT
CANISTER SUCTION WELLS/JOHNSON (MISCELLANEOUS) ×8 IMPLANT
CANISTER WOUND CARE 500ML ATS (WOUND CARE) ×4 IMPLANT
CATH THORACIC 36FR (CATHETERS) ×4 IMPLANT
CEMENT BONE SIMPLEX SPEEDSET (Cement) ×8 IMPLANT
CLOSURE WOUND 1/2 X4 (GAUZE/BANDAGES/DRESSINGS)
COVER SURGICAL LIGHT HANDLE (MISCELLANEOUS) ×4 IMPLANT
DRAPE C-ARM 42X72 X-RAY (DRAPES) ×4 IMPLANT
DRAPE C-ARMOR (DRAPES) ×4 IMPLANT
DRAPE INCISE IOBAN 66X45 STRL (DRAPES) ×4 IMPLANT
DRAPE ORTHO SPLIT 77X108 STRL (DRAPES) ×4
DRAPE SURG ORHT 6 SPLT 77X108 (DRAPES) ×4 IMPLANT
DRAPE U-SHAPE 47X51 STRL (DRAPES) ×4 IMPLANT
DRSG ADAPTIC 3X8 NADH LF (GAUZE/BANDAGES/DRESSINGS) ×4 IMPLANT
DRSG PAD ABDOMINAL 8X10 ST (GAUZE/BANDAGES/DRESSINGS) ×4 IMPLANT
DRSG VAC ATS SM SENSATRAC (GAUZE/BANDAGES/DRESSINGS) ×4 IMPLANT
ELECT CAUTERY BLADE 6.4 (BLADE) ×4 IMPLANT
ELECT REM PT RETURN 9FT ADLT (ELECTROSURGICAL) ×4
ELECTRODE REM PT RTRN 9FT ADLT (ELECTROSURGICAL) ×2 IMPLANT
EVACUATOR 1/8 PVC DRAIN (DRAIN) IMPLANT
GAUZE SPONGE 4X4 12PLY STRL (GAUZE/BANDAGES/DRESSINGS) ×4 IMPLANT
GLOVE BIO SURGEON STRL SZ7.5 (GLOVE) IMPLANT
GLOVE BIO SURGEON STRL SZ8 (GLOVE) ×12 IMPLANT
GLOVE BIO SURGEON STRL SZ8.5 (GLOVE) ×4 IMPLANT
GLOVE BIOGEL PI IND STRL 7.5 (GLOVE) IMPLANT
GLOVE BIOGEL PI IND STRL 8 (GLOVE) ×2 IMPLANT
GLOVE BIOGEL PI INDICATOR 7.5 (GLOVE)
GLOVE BIOGEL PI INDICATOR 8 (GLOVE) ×2
GOWN STRL REUS W/ TWL LRG LVL3 (GOWN DISPOSABLE) ×4 IMPLANT
GOWN STRL REUS W/ TWL XL LVL3 (GOWN DISPOSABLE) ×2 IMPLANT
GOWN STRL REUS W/TWL LRG LVL3 (GOWN DISPOSABLE) ×4
GOWN STRL REUS W/TWL XL LVL3 (GOWN DISPOSABLE) ×2
KIT BASIN OR (CUSTOM PROCEDURE TRAY) ×4 IMPLANT
KIT ROOM TURNOVER OR (KITS) ×4 IMPLANT
PACK GENERAL/GYN (CUSTOM PROCEDURE TRAY) ×4 IMPLANT
PAD ARMBOARD 7.5X6 YLW CONV (MISCELLANEOUS) ×8 IMPLANT
PADDING CAST ABS 4INX4YD NS (CAST SUPPLIES) ×2
PADDING CAST ABS 6INX4YD NS (CAST SUPPLIES) ×2
PADDING CAST ABS COTTON 4X4 ST (CAST SUPPLIES) ×2 IMPLANT
PADDING CAST ABS COTTON 6X4 NS (CAST SUPPLIES) ×2 IMPLANT
STAPLER VISISTAT 35W (STAPLE) ×4 IMPLANT
STRIP CLOSURE SKIN 1/2X4 (GAUZE/BANDAGES/DRESSINGS) IMPLANT
SUT ETHILON 2 0 FS 18 (SUTURE) ×8 IMPLANT
SUT PDS AB 2-0 CT1 27 (SUTURE) ×4 IMPLANT
SUT PROLENE 2 TP 1 (SUTURE) ×4 IMPLANT
SUT PROLENE 3 0 PS 2 (SUTURE) IMPLANT
SUT VIC AB 0 CT1 27 (SUTURE)
SUT VIC AB 0 CT1 27XBRD ANBCTR (SUTURE) IMPLANT
SUT VIC AB 2-0 CT1 27 (SUTURE)
SUT VIC AB 2-0 CT1 TAPERPNT 27 (SUTURE) IMPLANT
SUT VIC AB 2-0 CT3 27 (SUTURE) IMPLANT
TOWEL OR 17X24 6PK STRL BLUE (TOWEL DISPOSABLE) ×4 IMPLANT
TOWEL OR 17X26 10 PK STRL BLUE (TOWEL DISPOSABLE) ×8 IMPLANT
TOWER CARTRIDGE SMART MIX (DISPOSABLE) ×4 IMPLANT
TRAY FOLEY CATH 16FRSI W/METER (SET/KITS/TRAYS/PACK) IMPLANT
TUBE CONNECTING 12'X1/4 (SUCTIONS) ×1
TUBE CONNECTING 12X1/4 (SUCTIONS) ×3 IMPLANT
YANKAUER SUCT BULB TIP NO VENT (SUCTIONS) ×4 IMPLANT

## 2014-08-25 NOTE — OR Nursing (Signed)
16 antibiotic beads were placed in the open area of the right lower leg by Dr. Carola Frost.

## 2014-08-25 NOTE — Transfer of Care (Signed)
Immediate Anesthesia Transfer of Care Note  Patient: Bruce Escobar  Procedure(s) Performed: Procedure(s): IRRIGATION AND DEBRIDEMENT WOUND, partial excision of right tibia, and placement of antibiotic beads (Right) APPLICATION OF WOUND VAC (Right)  Patient Location: PACU  Anesthesia Type:General  Level of Consciousness: awake, alert  and oriented  Airway & Oxygen Therapy: Patient Spontanous Breathing and Patient connected to nasal cannula oxygen  Post-op Assessment: Report given to RN and Post -op Vital signs reviewed and stable  Post vital signs: Reviewed and stable  Last Vitals:  Filed Vitals:   08/25/14 0554  BP: 124/75  Pulse: 86  Temp: 37.2 C  Resp: 16    Complications: No apparent anesthesia complications
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
## 2014-08-25 NOTE — Brief Op Note (Signed)
08/20/2014 - 08/25/2014  8:16 AM  PATIENT:  Bruce Escobar  49 y.o. male  PRE-OPERATIVE DIAGNOSIS:   1. OSTEOMYELITIS, RIGHT TIBIA 2. NONUNION RIGHT TIBIA 3. ANTIBIOTIC BEADS RIGHT TIBIA  POST-OPERATIVE DIAGNOSIS:   1. OSTEOMYELITIS, RIGHT TIBIA 2. NONUNION RIGHT TIBIA 3. ANTIBIOTIC BEADS RIGHT TIBIA  PROCEDURES:  Procedure(s): 1. REPEAT PARTIAL EXCISION OF RIGHT TIBIA WITH CURETTAGE AND REAMING (Right) 2. PLACEMENT OF ANTIBIOTIC BEADS 3. SMALL WOUND VAC  SURGEON:  Surgeon(s) and Role:    * Myrene Galas, MD - Primary  PHYSICIAN ASSISTANT: None  ANESTHESIA:   general, Jairo Ben, MD  I/O:     SPECIMEN:  No Specimen  TOURNIQUET:  * No tourniquets in log *  DICTATION: .Other Dictation: Dictation Number [id number]
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
## 2014-08-25 NOTE — Anesthesia Postprocedure Evaluation (Signed)
  Anesthesia Post-op Note  Patient: Bruce Escobar  Procedure(s) Performed: Procedure(s): IRRIGATION AND DEBRIDEMENT WOUND, partial excision of right tibia, and placement of antibiotic beads (Right) APPLICATION OF WOUND VAC (Right)  Patient Location: PACU  Anesthesia Type:General  Level of Consciousness: awake, alert , oriented and patient cooperative  Airway and Oxygen Therapy: Patient Spontanous Breathing and Patient connected to nasal cannula oxygen  Post-op Pain: mild  Post-op Assessment: Post-op Vital signs reviewed, Patient's Cardiovascular Status Stable, Respiratory Function Stable, Patent Airway, No signs of Nausea or vomiting and Pain level controlled LLE Motor Response: Purposeful movement, No tremor, Responds to commands LLE Sensation: Full sensation, No numbness, No pain, No tingling RLE Motor Response: Purposeful movement, No tremor, Responds to commands RLE Sensation: Pain, Full sensation, No numbness, No tingling      Post-op Vital Signs: Reviewed and stable  Last Vitals:  Filed Vitals:   08/25/14 1108  BP: 118/71  Pulse: 52  Temp: 36.5 C  Resp: 12    Complications: No apparent anesthesia complications

## 2014-08-25 NOTE — Anesthesia Procedure Notes (Signed)
Procedure Name: Intubation Date/Time: 08/25/2014 2:47 AM Performed by: Conley Pawling, Jannet Askew Pre-anesthesia Checklist: Patient identified, Emergency Drugs available, Suction available, Patient being monitored and Timeout performed Patient Re-evaluated:Patient Re-evaluated prior to inductionOxygen Delivery Method: Circle system utilized Preoxygenation: Pre-oxygenation with 100% oxygen Intubation Type: IV induction Ventilation: Mask ventilation without difficulty and Oral airway inserted - appropriate to patient size Laryngoscope Size: Mac and 3 Grade View: Grade II Tube type: Oral Tube size: 7.5 mm Number of attempts: 2 Placement Confirmation: ETT inserted through vocal cords under direct vision,  positive ETCO2 and breath sounds checked- equal and bilateral Secured at: 23 cm Dental Injury: Teeth and Oropharynx as per pre-operative assessment

## 2014-08-25 NOTE — Progress Notes (Signed)
I discussed with the patient the risks and benefits of surgery for debridement and stabilization of his right tibia infected nonunion , including the possibility of infection, nerve injury, vessel injury, wound breakdown, arthritis, symptomatic hardware, DVT/ PE, loss of motion, and need for further surgery among others.  We also specifically discussed the elevated risk of soft tissue breakdown that could lead to amputation.  He understood these risks and wished to proceed.   Myrene Galas, MD [facility] [phone] (p)

## 2014-08-25 NOTE — Anesthesia Preprocedure Evaluation (Addendum)
Anesthesia Evaluation  Patient identified by MRN, date of birth, ID band Patient awake    Reviewed: Allergy & Precautions, NPO status , Patient's Chart, lab work & pertinent test results  History of Anesthesia Complications Negative for: history of anesthetic complications  Airway Mallampati: II  TM Distance: >3 FB Neck ROM: Full    Dental  (+) Dental Advisory Given   Pulmonary neg pulmonary ROS,  breath sounds clear to auscultation        Cardiovascular - anginanegative cardio ROS  Rhythm:Regular Rate:Normal     Neuro/Psych Chronic pain    GI/Hepatic Neg liver ROS, GERD-  Controlled,  Endo/Other  negative endocrine ROS  Renal/GU negative Renal ROS     Musculoskeletal  (+) Arthritis -,   Abdominal   Peds  Hematology negative hematology ROS (+)   Anesthesia Other Findings   Reproductive/Obstetrics                            Anesthesia Physical Anesthesia Plan  ASA: II  Anesthesia Plan: General   Post-op Pain Management:    Induction: Intravenous  Airway Management Planned: Oral ETT  Additional Equipment:   Intra-op Plan:   Post-operative Plan: Extubation in OR  Informed Consent: I have reviewed the patients History and Physical, chart, labs and discussed the procedure including the risks, benefits and alternatives for the proposed anesthesia with the patient or authorized representative who has indicated his/her understanding and acceptance.   Dental advisory given  Plan Discussed with: CRNA and Surgeon  Anesthesia Plan Comments: (Plan routine monitors, GETA)        Anesthesia Quick Evaluation

## 2014-08-25 NOTE — Care Management (Signed)
Utilization review completed. Judith Demps, RN Case Manager [phone]. 

## 2014-08-25 NOTE — Progress Notes (Signed)
PT Cancellation Note  Patient Details Name: Bruce Escobar MRN: [id number] DOB: 1965/07/19   Cancelled Treatment:    Reason Eval/Treat Not Completed: Patient at procedure or test/unavailable. Patient in OR for I&D. Will follow up as appropriate   Granville Whitefield, Adline Potter 08/25/2014, 7:19 AM

## 2014-08-25 NOTE — Progress Notes (Signed)
Post op note  Patient with asymmetric right LE clonus intraoperatively C/o severe headache pre-op C/o severe back pain post-op; hardly mentioned leg  Spoke with both ID service and Radiology and have ordered stat MRI's of spine to evaluate for suspected epidural abscess.  Myrene Galas, MD [facility] [phone] (p)

## 2014-08-26 ENCOUNTER — Inpatient Hospital Stay (HOSPITAL_COMMUNITY): Payer: 59

## 2014-08-26 ENCOUNTER — Encounter (HOSPITAL_COMMUNITY): Payer: Self-pay | Admitting: Orthopedic Surgery

## 2014-08-26 DIAGNOSIS — D72829 Elevated white blood cell count, unspecified: Secondary | ICD-10-CM

## 2014-08-26 DIAGNOSIS — R258 Other abnormal involuntary movements: Secondary | ICD-10-CM

## 2014-08-26 LAB — CBC
HCT: 32.1 % — ABNORMAL LOW (ref 39.0–52.0)
Hemoglobin: 10.9 g/dL — ABNORMAL LOW (ref 13.0–17.0)
MCH: 31.3 pg (ref 26.0–34.0)
MCHC: 34 g/dL (ref 30.0–36.0)
MCV: 92.2 fL (ref 78.0–100.0)
Platelets: 347 10*3/uL (ref 150–400)
RBC: 3.48 MIL/uL — AB (ref 4.22–5.81)
RDW: 14 % (ref 11.5–15.5)
WBC: 7.4 10*3/uL (ref 4.0–10.5)

## 2014-08-26 LAB — BASIC METABOLIC PANEL
Anion gap: 7 (ref 5–15)
BUN: 8 mg/dL (ref 6–20)
CO2: 28 mmol/L (ref 22–32)
Calcium: 8.4 mg/dL — ABNORMAL LOW (ref 8.9–10.3)
Chloride: 101 mmol/L (ref 101–111)
Creatinine, Ser: 0.99 mg/dL (ref 0.61–1.24)
Glucose, Bld: 114 mg/dL — ABNORMAL HIGH (ref 65–99)
Potassium: 4.2 mmol/L (ref 3.5–5.1)
SODIUM: 136 mmol/L (ref 135–145)

## 2014-08-26 LAB — TISSUE CULTURE

## 2014-08-26 LAB — PTH, INTACT AND CALCIUM
Calcium, Total (PTH): 7.9 mg/dL — ABNORMAL LOW (ref 8.7–10.2)
PTH: 21 pg/mL (ref 15–65)

## 2014-08-26 MED ORDER — LIDOCAINE HCL 1 % IJ SOLN
INTRAMUSCULAR | Status: AC
  Filled 2014-08-26: qty 20

## 2014-08-26 MED ORDER — VANCOMYCIN HCL IN DEXTROSE 1-5 GM/200ML-% IV SOLN
1000.0000 mg | INTRAVENOUS | Status: DC
  Administered 2014-08-29: 1000 mg via INTRAVENOUS
  Filled 2014-08-26 (×2): qty 200

## 2014-08-26 NOTE — Progress Notes (Signed)
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 mg Oral Daily  . vancomycin  1,250 mg Intravenous Q8H  . [START ON 08/29/2014] vancomycin  1,000 mg Intravenous To SS-Surg   Continuous Infusions: . lactated ringers 10 mL/hr at 08/25/14 1539    Principal Problem:   Cellulitis of right anterior lower leg Active Problems:   Sepsis affecting skin  Osteomyelitis of right lower extremity   History of Open fracture of tibia and fibula/post nailing Sept 2015   Elevated lactic acid level   Leukocytosis   Acute hyponatremia   Hardware complicating wound infection   Chronic paronychia of toe   MRSA colonization   Fracture of tibial shaft, right, open   Cellulitis of right lower extremity   MRSA infection    Time spent: 35 min    [facility] MD [facility] Pager [phone]. If 7PM-7AM, please contact night-coverage at [email or web address], password [facility] 08/26/2014, 6:16 PM  LOS: 6 days

## 2014-08-26 NOTE — Progress Notes (Signed)
Physical Therapy Treatment Patient Details Name: Bruce Escobar MRN: [id number] DOB: 1965-06-18 Today's Date: 08/26/2014    History of Present Illness Patient is a 49 y/o male admitted with right tibia osteomyelitis, Right tibia, nonunion, status post grade 3A open fracture now s/p parital excision R tibia, removal of IM nail, placement of antibiotic beads, R great toe nail ablation and wound vac application.    PT Comments    Patient is s/p above surgery resulting in functional limitations due to the deficits listed below (see PT Problem List). Patient will benefit from skilled PT to increase their independence and safety with mobility to allow discharge to the venue listed below. Patient did well with mobility, reminded to call for nursing to get out of bed. Declined getting up to chair, too much pain right now due to earlier PICC line placement. Nursing notified.     Follow Up Recommendations  No PT follow up     Equipment Recommendations  Rolling walker with 5" wheels    Recommendations for Other Services       Precautions / Restrictions Precautions Precautions: Fall Restrictions Weight Bearing Restrictions: Yes RLE Weight Bearing: Non weight bearing    Mobility  Bed Mobility Overal bed mobility: Modified Independent                Transfers Overall transfer level: Needs assistance Equipment used: Rolling walker (2 wheeled) Transfers: Sit to/from Stand Sit to Stand: Min guard            Ambulation/Gait Ambulation/Gait assistance: Min guard Ambulation Distance (Feet): 20 Feet Assistive device: Rolling walker (2 wheeled) Gait Pattern/deviations:  (swing through)         Careers information officer    Modified Rankin (Stroke Patients Only)       Balance Overall balance assessment: Needs assistance Sitting-balance support: No upper extremity supported Sitting balance-Leahy Scale: Normal     Standing balance support: Single  extremity supported Standing balance-Leahy Scale: Poor                      Cognition Arousal/Alertness: Awake/alert Behavior During Therapy: WFL for tasks assessed/performed Overall Cognitive Status: Within Functional Limits for tasks assessed                      Exercises      General Comments        Pertinent Vitals/Pain Pain Assessment: 0-10 Pain Score: 7  Pain Location: arm Pain Descriptors / Indicators: Aching Pain Intervention(s): Monitored during session    Home Living                      Prior Function            PT Goals (current goals can now be found in the care plan section) Acute Rehab PT Goals Patient Stated Goal: get leg healed up PT Goal Formulation: With patient Time For Goal Achievement: 08/27/14 Potential to Achieve Goals: Good Progress towards PT goals: Progressing toward goals    Frequency  Min 3X/week    PT Plan Current plan remains appropriate    Co-evaluation             End of Session Equipment Utilized During Treatment: Gait belt Activity Tolerance: Patient tolerated treatment well Patient left: in bed;with call bell/phone within reach     Time: 4098-1191 PT Time Calculation (min) (ACUTE ONLY): 17  min  Charges:  $Gait Training: 8-22 mins                    G Codes:      Christiane Ha, PT, CSCS Pager [phone] Office [phone]  08/26/2014, 4:43 PM

## 2014-08-26 NOTE — Procedures (Signed)
Successful placement of left basilic vein approach 44 cm single lumen PICC line with tip at the superior caval-atrial junction.   The PICC line is ready for immediate use.  Katherina Right, MD Pager #: [phone]

## 2014-08-26 NOTE — Progress Notes (Signed)
Peripherally Inserted Central Catheter/Midline Placement  The IV Nurse has discussed with the patient and/or persons authorized to consent for the patient, the purpose of this procedure and the potential benefits and risks involved with this procedure.  The benefits include less needle sticks, lab draws from the catheter and patient may be discharged home with the catheter.  Risks include, but not limited to, infection, bleeding, blood clot (thrombus formation), and puncture of an artery; nerve damage and irregular heat beat.  Alternatives to this procedure were also discussed.  PICC/Midline Placement Documentation        Lisabeth Devoid 08/26/2014, 11:41 AM

## 2014-08-26 NOTE — Op Note (Signed)
NAMEMarland Kitchen  COUGAR, IMEL NO.:  [id number]  MEDICAL RECORD NO.:  [id number]  LOCATION:  [id number]                        FACILITY:  [facility]  PHYSICIAN:  Doralee Albino. Carola Frost, M.D. DATE OF BIRTH:  1965/05/10  DATE OF PROCEDURE:  08/25/2014 DATE OF DISCHARGE:                              OPERATIVE REPORT   PREOPERATIVE DIAGNOSES: 1. Osteomyelitis, right tibia. 2. Nonunion, right tibia. 3. Antibiotic beads, right tibia.  POSTOPERATIVE DIAGNOSES: 1. Osteomyelitis, right tibia. 2. Nonunion, right tibia. 3. Antibiotic beads, right tibia.  PROCEDURES: 1. Repeat partial excision of right tibia with curettage and     intramedullary reaming. 2. Placement of antibiotic beads. 3. Application of small wound VAC.  SURGEON:  Doralee Albino. Carola Frost, M.D.  ASSISTANT:  None.  ANESTHESIA:  General, Germaine Pomfret, M.D.  SPECIMENS:  None.  DISPOSITION:  To PACU.  CONDITION:  Stable.  FINDINGS:  Right leg clonus, asymmetric.  BRIEF SUMMARY OF INDICATIONS FOR PROCEDURE:  Bruce Escobar is a 49 year old, status post repair of a grade 3A open tibia fracture a year ago who underwent debridement for an infected tibia nonunion and also an infected ingrown toenail.  Cultures have so far been positive for MRSA and he is on vancomycin and followed by the Infectious Disease Service. He reported a headache this morning, but denied any focal neurologic symptoms.  I discussed with him the risks and benefits of surgery including the possibility of failure to resolve his infection, need for further surgery, need for free flap placement, DVT, PE, loss of motion, and many others.  He acknowledged these risks and did wish to proceed.  BRIEF SUMMARY OF PROCEDURE:  Bruce Escobar remained on vancomycin perioperatively.  He was taken to the operating room where general anesthesia was induced.  During removal of the splint and subsequent prep and drape, clonus was noted of the right ankle.  This was  compared to the left where there was no finding of clonus or other neurologic abnormality.  We continued with the prep and drape, but I did contact the Infectious Disease Service to let him know both his headache as well as the new neurologic finding on the right.  The old antibiotic beads were removed, and there was purulence noted around the beads in the soft tissue.  I took a curette and removed all these fibrinous areas including within nonunion site and up and down the canal.  I was unable to completely remove this with curettage and consequently reopened the knee incision and passed the intramedullary reamer using an 11 mm reamer and passing this several times to help to debride the intramedullary canal and bone.  This was followed by several L of saline lavage through the intramedullary canal from the knee distally as well as within the nonunion site both distally and proximally.  I was able to remove a good deal of fibrinous material and ultimately the saline lavage was clean. This was also combined with local curettage about the distal locking bolt site.  16 antibiotic cement beads impregnated with vancomycin and tobramycin were inserted into this defect on a Prolene suture and then these were placed within the cavitary soft-tissue defect as well  as that of the intramedullary canal from the open medial cortex.  A small wound VAC was then applied.  Layered closure was performed to the knee and a distal locking bolt site using 1 PDS suture and then several 2-0 nylons. Posterior and stirrup splint were applied because of the rotational instability of the nonunion site.  The patient was then awakened from anesthesia and transported to PACU in stable condition.  PROGNOSIS:  Bruce Escobar to evaluate for either intracranial or epidural abscess.  I have discussed this with the Infectious Disease consultant, Dr. Paulette Blanch Dam, and we will also discuss  with the Radiology staff.  He will remain on IV vancomycin and will return to the OR in 3 to 4 days for a final I and D and placement of the antibiotic nail for stability.  I have also correspond with Dr. Graylin Shiver at [facility].  They will be happy to see the patient and plan for a free flap placement if we can achieve adequately clean bed with free flap anticipated on September 05, 2014.     Doralee Albino. Carola Frost, M.D.     MHH/MEDQ  D:  08/25/2014  T:  08/26/2014  Job:  [id number]
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
## 2014-08-27 DIAGNOSIS — Z22322 Carrier or suspected carrier of Methicillin resistant Staphylococcus aureus: Secondary | ICD-10-CM

## 2014-08-27 LAB — BASIC METABOLIC PANEL
Anion gap: 5 (ref 5–15)
BUN: 8 mg/dL (ref 6–20)
CO2: 28 mmol/L (ref 22–32)
Calcium: 8.4 mg/dL — ABNORMAL LOW (ref 8.9–10.3)
Chloride: 104 mmol/L (ref 101–111)
Creatinine, Ser: 1.01 mg/dL (ref 0.61–1.24)
GFR calc Af Amer: >60 mL/min (ref >60)
GFR calc non Af Amer: >60 mL/min (ref >60)
Glucose, Bld: 112 mg/dL — ABNORMAL HIGH (ref 65–99)
Potassium: 4.2 mmol/L (ref 3.5–5.1)
Sodium: 137 mmol/L (ref 135–145)

## 2014-08-27 LAB — ANAEROBIC CULTURE

## 2014-08-27 LAB — VANCOMYCIN, TROUGH: VANCOMYCIN TR: 37 ug/mL — AB (ref 10.0–20.0)

## 2014-08-27 LAB — CBC
HCT: 31.7 % — ABNORMAL LOW (ref 39.0–52.0)
Hemoglobin: 10.5 g/dL — ABNORMAL LOW (ref 13.0–17.0)
MCH: 30.1 pg (ref 26.0–34.0)
MCHC: 33.1 g/dL (ref 30.0–36.0)
MCV: 90.8 fL (ref 78.0–100.0)
PLATELETS: 345 10*3/uL (ref 150–400)
RBC: 3.49 MIL/uL — AB (ref 4.22–5.81)
RDW: 13.7 % (ref 11.5–15.5)
WBC: 7.2 10*3/uL (ref 4.0–10.5)

## 2014-08-27 MED ORDER — SODIUM CHLORIDE 0.9 % IJ SOLN
10.0000 mL | INTRAMUSCULAR | Status: DC | PRN
  Administered 2014-08-28 – 2014-08-30 (×4): 10 mL
  Filled 2014-08-27 (×4): qty 40
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
## 2014-08-27 NOTE — Progress Notes (Signed)
ANTIBIOTIC CONSULT NOTE - FOLLOW UP  Pharmacy Consult for Vancomycin Indication: osteomylitis  Allergies  Allergen Reactions  . Flagyl [Metronidazole]     Face flush  . Ibuprofen     Face Flush  . Penicillins     Rash    Patient Measurements: Height: 6' (182.9 cm) Weight: 178 lb 5.6 oz (80.9 kg) IBW/kg (Calculated) : 77.6  Vital Signs: Temp: 98.5 F (36.9 C) (08/13 0519) Temp Source: Oral (08/13 0519) BP: 102/57 mmHg (08/13 0519) Pulse Rate: 66 (08/13 0519) Intake/Output from previous day: 08/12 0701 - 08/13 0700 In: 480 [P.O.:480] Out: 3630 [Urine:3430; Drains:200] Intake/Output from this shift:    Labs:  Recent Labs  08/25/14 0550 08/26/14 0550 08/27/14 0540  WBC 6.3 7.4 7.2  HGB 10.9* 10.9* 10.5*  PLT 308 347 345  CREATININE 0.89 0.99 1.01   Estimated Creatinine Clearance: 99.2 mL/min (by C-G formula based on Cr of 1.01).  Recent Labs  08/27/14 0757  VANCOTROUGH 37*     Microbiology: Recent Results (from the past 720 hour(s))  Culture, blood (x 2)     Status: None   Collection Time: 08/20/14  3:00 PM  Result Value Ref Range Status   Specimen Description BLOOD LEFT ARM  Final   Special Requests   Final    BOTTLES DRAWN AEROBIC AND ANAEROBIC RED 5CC BLUE 10CC   Culture NO GROWTH 5 DAYS  Final   Report Status 08/25/2014 FINAL  Final  Culture, blood (x 2)     Status: None   Collection Time: 08/20/14  3:17 PM  Result Value Ref Range Status   Specimen Description BLOOD LEFT HAND  Final   Special Requests BOTTLES DRAWN AEROBIC ONLY 10CC  Final   Culture NO GROWTH 5 DAYS  Final   Report Status 08/25/2014 FINAL  Final  MRSA PCR Screening     Status: Abnormal   Collection Time: 08/20/14  4:41 PM  Result Value Ref Range Status   MRSA by PCR POSITIVE (A) NEGATIVE Final    Comment:        The GeneXpert MRSA Assay (FDA approved for NASAL specimens only), is one component of a comprehensive MRSA colonization surveillance program. It is not intended  to diagnose MRSA infection nor to guide or monitor treatment for MRSA infections. RESULT CALLED TO, READ BACK BY AND VERIFIED WITH: EVERETT,D RN 08/20/14 1945 WOOTEN,K   Anaerobic culture     Status: None (Preliminary result)   Collection Time: 08/22/14  4:24 PM  Result Value Ref Range Status   Specimen Description ABSCESS RIGHT LEG  Final   Special Requests NONE  Final   Gram Stain   Final    FEW WBC PRESENT, PREDOMINANTLY PMN NO SQUAMOUS EPITHELIAL CELLS SEEN RARE GRAM POSITIVE COCCI IN PAIRS Performed at [facility]    Culture   Final    NO ANAEROBES ISOLATED; CULTURE IN PROGRESS FOR 5 DAYS Performed at [facility]    Report Status PENDING  Incomplete  Gram stain     Status: None   Collection Time: 08/22/14  4:24 PM  Result Value Ref Range Status   Specimen Description ABSCESS RIGHT LEG  Final   Special Requests NONE  Final   Gram Stain   Final    ABUNDANT DEGENERATED CELLULAR MATERIAL PRESENT MODERATE WBC PRESENT,BOTH PMN AND MONONUCLEAR ABUNDANT GRAM POSITIVE COCCI IN PAIRS IN CLUSTERS    Report Status 08/22/2014 FINAL  Final  Culture, routine-abscess     Status: None  Collection Time: 08/22/14  4:24 PM  Result Value Ref Range Status   Specimen Description ABSCESS RIGHT LEG  Final   Special Requests NONE  Final   Gram Stain   Final    MODERATE WBC PRESENT,BOTH PMN AND MONONUCLEAR NO SQUAMOUS EPITHELIAL CELLS SEEN RARE GRAM POSITIVE COCCI IN PAIRS Performed at [facility]    Culture   Final    ABUNDANT METHICILLIN RESISTANT STAPHYLOCOCCUS AUREUS Note: RIFAMPIN AND GENTAMICIN SHOULD NOT BE USED AS SINGLE DRUGS FOR TREATMENT OF STAPH INFECTIONS. This organism DOES NOT demonstrate inducible Clindamycin resistance in vitro. CRITICAL RESULT CALLED TO, READ BACK BY AND VERIFIED WITH: AMY S RN 08/25/14  AT 725 AM BY [facility] Performed at [facility]    Report Status 08/25/2014 FINAL  Final   Organism ID, Bacteria METHICILLIN  RESISTANT STAPHYLOCOCCUS AUREUS  Final      Susceptibility   Methicillin resistant staphylococcus aureus - MIC*    CLINDAMYCIN <=0.25 SENSITIVE Sensitive     ERYTHROMYCIN >=8 RESISTANT Resistant     GENTAMICIN <=0.5 SENSITIVE Sensitive     LEVOFLOXACIN 0.25 SENSITIVE Sensitive     OXACILLIN >=4 RESISTANT Resistant     RIFAMPIN <=0.5 SENSITIVE Sensitive     TRIMETH/SULFA <=10 SENSITIVE Sensitive     VANCOMYCIN 1 SENSITIVE Sensitive     TETRACYCLINE <=1 SENSITIVE Sensitive     * ABUNDANT METHICILLIN RESISTANT STAPHYLOCOCCUS AUREUS  Tissue culture     Status: None   Collection Time: 08/22/14  4:36 PM  Result Value Ref Range Status   Specimen Description TISSUE RIGHT TIBIA  Final   Special Requests SPEC B  Final   Gram Stain   Final    MODERATE WBC PRESENT, PREDOMINANTLY PMN NO ORGANISMS SEEN Performed at [facility]    Culture   Final    FEW METHICILLIN RESISTANT STAPHYLOCOCCUS AUREUS Note: RIFAMPIN AND GENTAMICIN SHOULD NOT BE USED AS SINGLE DRUGS FOR TREATMENT OF STAPH INFECTIONS. This organism DOES NOT demonstrate inducible Clindamycin resistance in vitro. CRITICAL RESULT CALLED TO, READ BACK BY AND VERIFIED WITH: AMY S 08/25/14 AT  7:25 AM BY [facility] Performed at [facility]    Report Status 08/25/2014 FINAL  Final   Organism ID, Bacteria METHICILLIN RESISTANT STAPHYLOCOCCUS AUREUS  Final      Susceptibility   Methicillin resistant staphylococcus aureus - MIC*    CLINDAMYCIN <=0.25 SENSITIVE Sensitive     ERYTHROMYCIN >=8 RESISTANT Resistant     GENTAMICIN <=0.5 SENSITIVE Sensitive     LEVOFLOXACIN 0.25 SENSITIVE Sensitive     OXACILLIN >=4 RESISTANT Resistant     RIFAMPIN <=0.5 SENSITIVE Sensitive     TRIMETH/SULFA <=10 SENSITIVE Sensitive     VANCOMYCIN 1 SENSITIVE Sensitive     TETRACYCLINE <=1 SENSITIVE Sensitive     * FEW METHICILLIN RESISTANT STAPHYLOCOCCUS AUREUS  Tissue culture     Status: None   Collection Time: 08/22/14  5:17 PM  Result Value Ref  Range Status   Specimen Description TISSUE  Final   Special Requests RIGHT TIBIA C  Final   Gram Stain   Final    FEW WBC PRESENT, PREDOMINANTLY PMN NO ORGANISMS SEEN Performed at [facility]    Culture   Final    FEW METHICILLIN RESISTANT STAPHYLOCOCCUS AUREUS Note: RIFAMPIN AND GENTAMICIN SHOULD NOT BE USED AS SINGLE DRUGS FOR TREATMENT OF STAPH INFECTIONS. This organism DOES NOT demonstrate inducible Clindamycin resistance in vitro. CRITICAL RESULT CALLED TO, READ BACK BY AND VERIFIED WITH: [facility]  ANNE AT  2:00PM ON 08/26/14 HAJAM Performed at [facility]    Report Status 08/26/2014 FINAL  Final   Organism ID, Bacteria METHICILLIN RESISTANT STAPHYLOCOCCUS AUREUS  Final      Susceptibility   Methicillin resistant staphylococcus aureus - MIC*    CLINDAMYCIN <=0.25 SENSITIVE Sensitive     ERYTHROMYCIN >=8 RESISTANT Resistant     GENTAMICIN <=0.5 SENSITIVE Sensitive     LEVOFLOXACIN 0.25 SENSITIVE Sensitive     OXACILLIN >=4 RESISTANT Resistant     RIFAMPIN <=0.5 SENSITIVE Sensitive     TRIMETH/SULFA <=10 SENSITIVE Sensitive     VANCOMYCIN 1 SENSITIVE Sensitive     TETRACYCLINE <=1 SENSITIVE Sensitive     * FEW METHICILLIN RESISTANT STAPHYLOCOCCUS AUREUS    Anti-infectives    Start     Dose/Rate Route Frequency Ordered Stop   08/29/14 1130  vancomycin (VANCOCIN) IVPB 1000 mg/200 mL premix     1,000 mg 200 mL/hr over 60 Minutes Intravenous To [facility] 08/26/14 1336 08/30/14 1130   08/25/14 1700  vancomycin (VANCOCIN) 1,250 mg in sodium chloride 0.9 % 250 mL IVPB  Status:  Discontinued     1,250 mg 166.7 mL/hr over 90 Minutes Intravenous Every 8 hours 08/25/14 1658 08/27/14 0945   08/25/14 1400  vancomycin (VANCOCIN) 1,250 mg in sodium chloride 0.9 % 250 mL IVPB  Status:  Discontinued     1,250 mg 166.7 mL/hr over 90 Minutes Intravenous Every 8 hours 08/24/14 1709 08/25/14 1148   08/25/14 1230  vancomycin (VANCOCIN) 1,250 mg in sodium chloride 0.9 % 250  mL IVPB  Status:  Discontinued     1,250 mg 166.7 mL/hr over 90 Minutes Intravenous Every 8 hours 08/25/14 1148 08/25/14 1658   08/25/14 0934  tobramycin (NEBCIN) powder  Status:  Discontinued       As needed 08/25/14 0934 08/25/14 1022   08/25/14 0934  vancomycin (VANCOCIN) powder  Status:  Discontinued       As needed 08/25/14 0935 08/25/14 1022   08/25/14 0730  vancomycin (VANCOCIN) IVPB 1000 mg/200 mL premix  Status:  Discontinued     1,000 mg 200 mL/hr over 60 Minutes Intravenous To [facility]Stay [facility] 08/24/14 1100 08/25/14 1109   08/24/14 1045  vancomycin (VANCOCIN) IVPB 1000 mg/200 mL premix  Status:  Discontinued     1,000 mg 200 mL/hr over 60 Minutes Intravenous To [facility]Stay [facility] 08/24/14 1031 08/24/14 1100   08/22/14 1620  tobramycin (NEBCIN) powder  Status:  Discontinued       As needed 08/22/14 1620 08/22/14 1827   08/22/14 1620  vancomycin (VANCOCIN) powder  Status:  Discontinued       As needed 08/22/14 1620 08/22/14 1827   08/20/14 2200  aztreonam (AZACTAM) 2 g in dextrose 5 % 50 mL IVPB  Status:  Discontinued     2 g 100 mL/hr over 30 Minutes Intravenous 3 times per day 08/20/14 1459 08/20/14 1831   08/20/14 2200  metroNIDAZOLE (FLAGYL) IVPB 500 mg  Status:  Discontinued     500 mg 100 mL/hr over 60 Minutes Intravenous Every 8 hours 08/20/14 1459 08/20/14 1831   08/20/14 2200  vancomycin (VANCOCIN) IVPB 1000 mg/200 mL premix     1,000 mg 200 mL/hr over 60 Minutes Intravenous Every 8 hours 08/20/14 1459 08/24/14 2359   08/20/14 2200  aztreonam (AZACTAM) injection 2 g  Status:  Discontinued     2 g Intramuscular 3 times per day 08/20/14 1845 08/20/14 1851   08/20/14 1900  aztreonam (AZACTAM) 2 g in dextrose 5 % 50 mL IVPB  Status:  Discontinued     2 g 100 mL/hr over 30 Minutes Intravenous Every 8 hours 08/20/14 1852 08/22/14 1343   08/20/14 1515  aztreonam (AZACTAM) 2 g in dextrose 5 % 50 mL IVPB  Status:  Discontinued     2 g 100 mL/hr over 30 Minutes Intravenous   Once 08/20/14 1443 08/20/14 1831   08/20/14 1445  metroNIDAZOLE (FLAGYL) IVPB 500 mg  Status:  Discontinued     500 mg 100 mL/hr over 60 Minutes Intravenous  Once 08/20/14 1443 08/20/14 1831   08/20/14 1445  vancomycin (VANCOCIN) IVPB 1000 mg/200 mL premix  Status:  Discontinued     1,000 mg 200 mL/hr over 60 Minutes Intravenous  Once 08/20/14 1443 08/20/14 1449   08/20/14 1400  imipenem-cilastatin (PRIMAXIN) 500 mg in sodium chloride 0.9 % 100 mL IVPB  Status:  Discontinued     500 mg 200 mL/hr over 30 Minutes Intravenous 4 times per day 08/20/14 1314 08/20/14 1459   08/20/14 1330  vancomycin (VANCOCIN) 1,750 mg in sodium chloride 0.9 % 500 mL IVPB     1,750 mg 250 mL/hr over 120 Minutes Intravenous  Once 08/20/14 1149 08/20/14 1525   08/20/14 0100  metroNIDAZOLE (FLAGYL) IVPB 500 mg  Status:  Discontinued     500 mg 100 mL/hr over 60 Minutes Intravenous Every 8 hours 08/20/14 1848 08/21/14 0846      Assessment: 49 yo M undergoing 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  Vancomycin trough today on 1250mg  IV q8h dose was 37 (goal 15-20).  Hold vanc, renal fxn stable.  Goal of Therapy:  Vancomycin trough level 15-20 mcg/ml  Plan:  Hold Vancomycin Follow-up clinical progression, renal fxn Repeat random trough 8/14 0800 Repeat trough at steady state  Sherron Monday, PharmD Clinical Pharmacy Resident Pager: [phone] 08/27/2014 9:58 AM

## 2014-08-27 NOTE — Progress Notes (Signed)
Got an phone call from lab for a critical lab, pt Vanco trough is 37. Called the Pharmacy, spoke with Carren Rang. She said hold the dose and staff from pharmacy will re-dose the vanco shortly.Since I already administered the Vanco scheduled at 0900, I stopped the dose at 0942, there was 92 ml of fluid left in the vanco bag. Assess pt and he said he is really tired due to poor sleep. Denied other discomfort or symptoms. Will monitor pt closely.

## 2014-08-28 LAB — CBC
HEMATOCRIT: 32.6 % — AB (ref 39.0–52.0)
Hemoglobin: 11 g/dL — ABNORMAL LOW (ref 13.0–17.0)
MCH: 31.1 pg (ref 26.0–34.0)
MCHC: 33.7 g/dL (ref 30.0–36.0)
MCV: 92.1 fL (ref 78.0–100.0)
PLATELETS: 369 10*3/uL (ref 150–400)
RBC: 3.54 MIL/uL — ABNORMAL LOW (ref 4.22–5.81)
RDW: 13.9 % (ref 11.5–15.5)
WBC: 7.7 10*3/uL (ref 4.0–10.5)

## 2014-08-28 LAB — BASIC METABOLIC PANEL
ANION GAP: 7 (ref 5–15)
BUN: 7 mg/dL (ref 6–20)
CHLORIDE: 102 mmol/L (ref 101–111)
CO2: 29 mmol/L (ref 22–32)
CREATININE: 1.07 mg/dL (ref 0.61–1.24)
Calcium: 8.7 mg/dL — ABNORMAL LOW (ref 8.9–10.3)
GFR calc Af Amer: >60 mL/min (ref >60)
GFR calc non Af Amer: >60 mL/min (ref >60)
GLUCOSE: 109 mg/dL — AB (ref 65–99)
Potassium: 4 mmol/L (ref 3.5–5.1)
SODIUM: 138 mmol/L (ref 135–145)

## 2014-08-28 LAB — VANCOMYCIN, RANDOM: Vancomycin Rm: 5 ug/mL

## 2014-08-28 MED ORDER — VANCOMYCIN HCL IN DEXTROSE 1-5 GM/200ML-% IV SOLN
1000.0000 mg | Freq: Three times a day (TID) | INTRAVENOUS | Status: DC
  Administered 2014-08-28 – 2014-08-30 (×6): 1000 mg via INTRAVENOUS
  Filled 2014-08-28 (×10): qty 200
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
## 2014-08-28 NOTE — Progress Notes (Signed)
ANTIBIOTIC CONSULT NOTE - FOLLOW UP  Pharmacy Consult for Vancomycin Indication: osteomylitis  Allergies  Allergen Reactions  . Flagyl [Metronidazole]     Face flush  . Ibuprofen     Face Flush  . Penicillins     Rash    Patient Measurements: Height: 6' (182.9 cm) Weight: 178 lb 5.6 oz (80.9 kg) IBW/kg (Calculated) : 77.6  Vital Signs: Temp: 98.6 F (37 C) (08/14 0600) Temp Source: Oral (08/14 0600) BP: 118/79 mmHg (08/14 0600) Pulse Rate: 80 (08/14 0600) Intake/Output from previous day: 08/13 0701 - 08/14 0700 In: 720 [P.O.:720] Out: 1500 [Urine:1450; Drains:50] Intake/Output from this shift:    Labs:  Recent Labs  08/26/14 0550 08/27/14 0540 08/28/14 0422  WBC 7.4 7.2 7.7  HGB 10.9* 10.5* 11.0*  PLT 347 345 369  CREATININE 0.99 1.01 1.07   Estimated Creatinine Clearance: 93.7 mL/min (by C-G formula based on Cr of 1.07).  Recent Labs  08/27/14 0757 08/28/14 1300  VANCOTROUGH 37*  --   VANCORANDOM  --  5     Microbiology: Recent Results (from the past 720 hour(s))  Culture, blood (x 2)     Status: None   Collection Time: 08/20/14  3:00 PM  Result Value Ref Range Status   Specimen Description BLOOD LEFT ARM  Final   Special Requests   Final    BOTTLES DRAWN AEROBIC AND ANAEROBIC RED 5CC BLUE 10CC   Culture NO GROWTH 5 DAYS  Final   Report Status 08/25/2014 FINAL  Final  Culture, blood (x 2)     Status: None   Collection Time: 08/20/14  3:17 PM  Result Value Ref Range Status   Specimen Description BLOOD LEFT HAND  Final   Special Requests BOTTLES DRAWN AEROBIC ONLY 10CC  Final   Culture NO GROWTH 5 DAYS  Final   Report Status 08/25/2014 FINAL  Final  MRSA PCR Screening     Status: Abnormal   Collection Time: 08/20/14  4:41 PM  Result Value Ref Range Status   MRSA by PCR POSITIVE (A) NEGATIVE Final    Comment:        The GeneXpert MRSA Assay (FDA approved for NASAL specimens only), is one component of a comprehensive MRSA  colonization surveillance program. It is not intended to diagnose MRSA infection nor to guide or monitor treatment for MRSA infections. RESULT CALLED TO, READ BACK BY AND VERIFIED WITH: EVERETT,D RN 08/20/14 1945 WOOTEN,K   Anaerobic culture     Status: None   Collection Time: 08/22/14  4:24 PM  Result Value Ref Range Status   Specimen Description ABSCESS RIGHT LEG  Final   Special Requests NONE  Final   Gram Stain   Final    FEW WBC PRESENT, PREDOMINANTLY PMN NO SQUAMOUS EPITHELIAL CELLS SEEN RARE GRAM POSITIVE COCCI IN PAIRS Performed at [facility]    Culture   Final    NO ANAEROBES ISOLATED Performed at [facility]    Report Status 08/27/2014 FINAL  Final  Gram stain     Status: None   Collection Time: 08/22/14  4:24 PM  Result Value Ref Range Status   Specimen Description ABSCESS RIGHT LEG  Final   Special Requests NONE  Final   Gram Stain   Final    ABUNDANT DEGENERATED CELLULAR MATERIAL PRESENT MODERATE WBC PRESENT,BOTH PMN AND MONONUCLEAR ABUNDANT GRAM POSITIVE COCCI IN PAIRS IN CLUSTERS    Report Status 08/22/2014 FINAL  Final  Culture, routine-abscess  Status: None   Collection Time: 08/22/14  4:24 PM  Result Value Ref Range Status   Specimen Description ABSCESS RIGHT LEG  Final   Special Requests NONE  Final   Gram Stain   Final    MODERATE WBC PRESENT,BOTH PMN AND MONONUCLEAR NO SQUAMOUS EPITHELIAL CELLS SEEN RARE GRAM POSITIVE COCCI IN PAIRS Performed at [facility]    Culture   Final    ABUNDANT METHICILLIN RESISTANT STAPHYLOCOCCUS AUREUS Note: RIFAMPIN AND GENTAMICIN SHOULD NOT BE USED AS SINGLE DRUGS FOR TREATMENT OF STAPH INFECTIONS. This organism DOES NOT demonstrate inducible Clindamycin resistance in vitro. CRITICAL RESULT CALLED TO, READ BACK BY AND VERIFIED WITH: AMY S RN 08/25/14  AT 725 AM BY [facility] Performed at [facility]    Report Status 08/25/2014 FINAL  Final   Organism ID, Bacteria METHICILLIN  RESISTANT STAPHYLOCOCCUS AUREUS  Final      Susceptibility   Methicillin resistant staphylococcus aureus - MIC*    CLINDAMYCIN <=0.25 SENSITIVE Sensitive     ERYTHROMYCIN >=8 RESISTANT Resistant     GENTAMICIN <=0.5 SENSITIVE Sensitive     LEVOFLOXACIN 0.25 SENSITIVE Sensitive     OXACILLIN >=4 RESISTANT Resistant     RIFAMPIN <=0.5 SENSITIVE Sensitive     TRIMETH/SULFA <=10 SENSITIVE Sensitive     VANCOMYCIN 1 SENSITIVE Sensitive     TETRACYCLINE <=1 SENSITIVE Sensitive     * ABUNDANT METHICILLIN RESISTANT STAPHYLOCOCCUS AUREUS  Tissue culture     Status: None   Collection Time: 08/22/14  4:36 PM  Result Value Ref Range Status   Specimen Description TISSUE RIGHT TIBIA  Final   Special Requests SPEC B  Final   Gram Stain   Final    MODERATE WBC PRESENT, PREDOMINANTLY PMN NO ORGANISMS SEEN Performed at [facility]    Culture   Final    FEW METHICILLIN RESISTANT STAPHYLOCOCCUS AUREUS Note: RIFAMPIN AND GENTAMICIN SHOULD NOT BE USED AS SINGLE DRUGS FOR TREATMENT OF STAPH INFECTIONS. This organism DOES NOT demonstrate inducible Clindamycin resistance in vitro. CRITICAL RESULT CALLED TO, READ BACK BY AND VERIFIED WITH: AMY S 08/25/14 AT  7:25 AM BY [facility] Performed at [facility]    Report Status 08/25/2014 FINAL  Final   Organism ID, Bacteria METHICILLIN RESISTANT STAPHYLOCOCCUS AUREUS  Final      Susceptibility   Methicillin resistant staphylococcus aureus - MIC*    CLINDAMYCIN <=0.25 SENSITIVE Sensitive     ERYTHROMYCIN >=8 RESISTANT Resistant     GENTAMICIN <=0.5 SENSITIVE Sensitive     LEVOFLOXACIN 0.25 SENSITIVE Sensitive     OXACILLIN >=4 RESISTANT Resistant     RIFAMPIN <=0.5 SENSITIVE Sensitive     TRIMETH/SULFA <=10 SENSITIVE Sensitive     VANCOMYCIN 1 SENSITIVE Sensitive     TETRACYCLINE <=1 SENSITIVE Sensitive     * FEW METHICILLIN RESISTANT STAPHYLOCOCCUS AUREUS  Tissue culture     Status: None   Collection Time: 08/22/14  5:17 PM  Result Value Ref  Range Status   Specimen Description TISSUE  Final   Special Requests RIGHT TIBIA C  Final   Gram Stain   Final    FEW WBC PRESENT, PREDOMINANTLY PMN NO ORGANISMS SEEN Performed at [facility]    Culture   Final    FEW METHICILLIN RESISTANT STAPHYLOCOCCUS AUREUS Note: RIFAMPIN AND GENTAMICIN SHOULD NOT BE USED AS SINGLE DRUGS FOR TREATMENT OF STAPH INFECTIONS. This organism DOES NOT demonstrate inducible Clindamycin resistance in vitro. CRITICAL RESULT CALLED TO, READ BACK BY  AND VERIFIED WITH: MARY ANNE AT  2:00PM ON 08/26/14 HAJAM Performed at [facility]    Report Status 08/26/2014 FINAL  Final   Organism ID, Bacteria METHICILLIN RESISTANT STAPHYLOCOCCUS AUREUS  Final      Susceptibility   Methicillin resistant staphylococcus aureus - MIC*    CLINDAMYCIN <=0.25 SENSITIVE Sensitive     ERYTHROMYCIN >=8 RESISTANT Resistant     GENTAMICIN <=0.5 SENSITIVE Sensitive     LEVOFLOXACIN 0.25 SENSITIVE Sensitive     OXACILLIN >=4 RESISTANT Resistant     RIFAMPIN <=0.5 SENSITIVE Sensitive     TRIMETH/SULFA <=10 SENSITIVE Sensitive     VANCOMYCIN 1 SENSITIVE Sensitive     TETRACYCLINE <=1 SENSITIVE Sensitive     * FEW METHICILLIN RESISTANT STAPHYLOCOCCUS AUREUS    Anti-infectives    Start     Dose/Rate Route Frequency Ordered Stop   08/29/14 1130  vancomycin (VANCOCIN) IVPB 1000 mg/200 mL premix     1,000 mg 200 mL/hr over 60 Minutes Intravenous To ShortStay [facility] 08/26/14 1336 08/30/14 1130   08/28/14 1530  vancomycin (VANCOCIN) IVPB 1000 mg/200 mL premix     1,000 mg 200 mL/hr over 60 Minutes Intravenous Every 8 hours 08/28/14 1433     08/25/14 1700  vancomycin (VANCOCIN) 1,250 mg in sodium chloride 0.9 % 250 mL IVPB  Status:  Discontinued     1,250 mg 166.7 mL/hr over 90 Minutes Intravenous Every 8 hours 08/25/14 1658 08/27/14 0945   08/25/14 1400  vancomycin (VANCOCIN) 1,250 mg in sodium chloride 0.9 % 250 mL IVPB  Status:  Discontinued     1,250 mg 166.7  mL/hr over 90 Minutes Intravenous Every 8 hours 08/24/14 1709 08/25/14 1148   08/25/14 1230  vancomycin (VANCOCIN) 1,250 mg in sodium chloride 0.9 % 250 mL IVPB  Status:  Discontinued     1,250 mg 166.7 mL/hr over 90 Minutes Intravenous Every 8 hours 08/25/14 1148 08/25/14 1658   08/25/14 0934  tobramycin (NEBCIN) powder  Status:  Discontinued       As needed 08/25/14 0934 08/25/14 1022   08/25/14 0934  vancomycin (VANCOCIN) powder  Status:  Discontinued       As needed 08/25/14 0935 08/25/14 1022   08/25/14 0730  vancomycin (VANCOCIN) IVPB 1000 mg/200 mL premix  Status:  Discontinued     1,000 mg 200 mL/hr over 60 Minutes Intravenous To ShortStay Surgical 08/24/14 1100 08/25/14 1109   08/24/14 1045  vancomycin (VANCOCIN) IVPB 1000 mg/200 mL premix  Status:  Discontinued     1,000 mg 200 mL/hr over 60 Minutes Intravenous To ShortStay Surgical 08/24/14 1031 08/24/14 1100   08/22/14 1620  tobramycin (NEBCIN) powder  Status:  Discontinued       As needed 08/22/14 1620 08/22/14 1827   08/22/14 1620  vancomycin (VANCOCIN) powder  Status:  Discontinued       As needed 08/22/14 1620 08/22/14 1827   08/20/14 2200  aztreonam (AZACTAM) 2 g in dextrose 5 % 50 mL IVPB  Status:  Discontinued     2 g 100 mL/hr over 30 Minutes Intravenous 3 times per day 08/20/14 1459 08/20/14 1831   08/20/14 2200  metroNIDAZOLE (FLAGYL) IVPB 500 mg  Status:  Discontinued     500 mg 100 mL/hr over 60 Minutes Intravenous Every 8 hours 08/20/14 1459 08/20/14 1831   08/20/14 2200  vancomycin (VANCOCIN) IVPB 1000 mg/200 mL premix     1,000 mg 200 mL/hr over 60 Minutes Intravenous Every 8 hours 08/20/14 1459 08/24/14  2359   08/20/14 2200  aztreonam (AZACTAM) injection 2 g  Status:  Discontinued     2 g Intramuscular 3 times per day 08/20/14 1845 08/20/14 1851   08/20/14 1900  aztreonam (AZACTAM) 2 g in dextrose 5 % 50 mL IVPB  Status:  Discontinued     2 g 100 mL/hr over 30 Minutes Intravenous Every 8 hours 08/20/14 1852  08/22/14 1343   08/20/14 1515  aztreonam (AZACTAM) 2 g in dextrose 5 % 50 mL IVPB  Status:  Discontinued     2 g 100 mL/hr over 30 Minutes Intravenous  Once 08/20/14 1443 08/20/14 1831   08/20/14 1445  metroNIDAZOLE (FLAGYL) IVPB 500 mg  Status:  Discontinued     500 mg 100 mL/hr over 60 Minutes Intravenous  Once 08/20/14 1443 08/20/14 1831   08/20/14 1445  vancomycin (VANCOCIN) IVPB 1000 mg/200 mL premix  Status:  Discontinued     1,000 mg 200 mL/hr over 60 Minutes Intravenous  Once 08/20/14 1443 08/20/14 1449   08/20/14 1400  imipenem-cilastatin (PRIMAXIN) 500 mg in sodium chloride 0.9 % 100 mL IVPB  Status:  Discontinued     500 mg 200 mL/hr over 30 Minutes Intravenous 4 times per day 08/20/14 1314 08/20/14 1459   08/20/14 1330  vancomycin (VANCOCIN) 1,750 mg in sodium chloride 0.9 % 500 mL IVPB     1,750 mg 250 mL/hr over 120 Minutes Intravenous  Once 08/20/14 1149 08/20/14 1525   08/20/14 0100  metroNIDAZOLE (FLAGYL) IVPB 500 mg  Status:  Discontinued     500 mg 100 mL/hr over 60 Minutes Intravenous Every 8 hours 08/20/14 1848 08/21/14 0846      Assessment: 49 yo M undergoing 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  Vancomycin trough yesterday on 1250mg  IV q8h dose was 37 (goal 15-20).  Vancomycin was held and a random vanc level was ordered for 0800.   Vancomycin level was drawn late. Level at 1400 was 5. Level was to be drawn at 0800.  Goal of Therapy:  Vancomycin trough level 15-20 mcg/ml  Plan:  Vancomycin 1000 mg every 8 hours Follow-up clinical progression, renal fxn Repeat trough at steady state.   Sherron Monday, PharmD Clinical Pharmacy Resident Pager: [phone] 08/28/2014 2:34 PM

## 2014-08-29 ENCOUNTER — Encounter (HOSPITAL_COMMUNITY)
Admission: EM | Disposition: A | Discharge status: Home / Self Care | Payer: Self-pay | Source: Home / Self Care | Attending: Internal Medicine

## 2014-08-29 ENCOUNTER — Encounter (HOSPITAL_COMMUNITY): Payer: Self-pay | Admitting: Certified Registered Nurse Anesthetist

## 2014-08-29 ENCOUNTER — Inpatient Hospital Stay (HOSPITAL_COMMUNITY): Payer: 59

## 2014-08-29 ENCOUNTER — Inpatient Hospital Stay (HOSPITAL_COMMUNITY): Payer: 59 | Admitting: Anesthesiology

## 2014-08-29 HISTORY — PX: TIBIA IM NAIL INSERTION: SHX2516

## 2014-08-29 HISTORY — PX: APPLICATION OF WOUND VAC: SHX5189

## 2014-08-29 LAB — CBC
HCT: 31.8 % — ABNORMAL LOW (ref 39.0–52.0)
HEMOGLOBIN: 10.7 g/dL — AB (ref 13.0–17.0)
MCH: 30.9 pg (ref 26.0–34.0)
MCHC: 33.6 g/dL (ref 30.0–36.0)
MCV: 91.9 fL (ref 78.0–100.0)
Platelets: 350 10*3/uL (ref 150–400)
RBC: 3.46 MIL/uL — ABNORMAL LOW (ref 4.22–5.81)
RDW: 13.9 % (ref 11.5–15.5)
WBC: 7.3 10*3/uL (ref 4.0–10.5)

## 2014-08-29 LAB — BASIC METABOLIC PANEL
ANION GAP: 6 (ref 5–15)
BUN: 6 mg/dL (ref 6–20)
CO2: 29 mmol/L (ref 22–32)
Calcium: 8.8 mg/dL — ABNORMAL LOW (ref 8.9–10.3)
Chloride: 101 mmol/L (ref 101–111)
Creatinine, Ser: 0.96 mg/dL (ref 0.61–1.24)
GFR calc Af Amer: >60 mL/min (ref >60)
Glucose, Bld: 115 mg/dL — ABNORMAL HIGH (ref 65–99)
POTASSIUM: 4 mmol/L (ref 3.5–5.1)
SODIUM: 136 mmol/L (ref 135–145)

## 2014-08-29 SURGERY — INSERTION, INTRAMEDULLARY ROD, TIBIA
Anesthesia: General | Site: Leg Lower | Laterality: Right

## 2014-08-29 MED ORDER — PROPOFOL 10 MG/ML IV BOLUS
INTRAVENOUS | Status: DC | PRN
  Administered 2014-08-29: 200 mg via INTRAVENOUS

## 2014-08-29 MED ORDER — LIDOCAINE HCL (CARDIAC) 20 MG/ML IV SOLN
INTRAVENOUS | Status: DC | PRN
  Administered 2014-08-29: 80 mg via INTRAVENOUS

## 2014-08-29 MED ORDER — ROCURONIUM BROMIDE 100 MG/10ML IV SOLN
INTRAVENOUS | Status: DC | PRN
  Administered 2014-08-29: 40 mg via INTRAVENOUS

## 2014-08-29 MED ORDER — PROMETHAZINE HCL 25 MG/ML IJ SOLN
INTRAMUSCULAR | Status: AC
  Filled 2014-08-29: qty 1

## 2014-08-29 MED ORDER — PROPOFOL 10 MG/ML IV BOLUS
INTRAVENOUS | Status: AC
  Filled 2014-08-29: qty 20

## 2014-08-29 MED ORDER — HYDROMORPHONE HCL 1 MG/ML IJ SOLN
0.5000 mg | INTRAMUSCULAR | Status: DC | PRN
  Administered 2014-08-29 (×2): 0.5 mg via INTRAVENOUS

## 2014-08-29 MED ORDER — DEXMEDETOMIDINE HCL 200 MCG/2ML IV SOLN
INTRAVENOUS | Status: DC | PRN
  Administered 2014-08-29 (×2): 8 ug via INTRAVENOUS
  Administered 2014-08-29: 4 ug via INTRAVENOUS

## 2014-08-29 MED ORDER — ONDANSETRON HCL 4 MG/2ML IJ SOLN
INTRAMUSCULAR | Status: AC
  Filled 2014-08-29: qty 2

## 2014-08-29 MED ORDER — VANCOMYCIN HCL 1000 MG IV SOLR
INTRAVENOUS | Status: AC
  Filled 2014-08-29: qty 1000

## 2014-08-29 MED ORDER — PHENYLEPHRINE 40 MCG/ML (10ML) SYRINGE FOR IV PUSH (FOR BLOOD PRESSURE SUPPORT)
PREFILLED_SYRINGE | INTRAVENOUS | Status: AC
  Filled 2014-08-29: qty 10

## 2014-08-29 MED ORDER — FENTANYL CITRATE (PF) 250 MCG/5ML IJ SOLN
INTRAMUSCULAR | Status: AC
  Filled 2014-08-29: qty 5

## 2014-08-29 MED ORDER — TOBRAMYCIN SULFATE 1.2 G IJ SOLR
INTRAMUSCULAR | Status: DC | PRN
  Administered 2014-08-29: 1.2 g

## 2014-08-29 MED ORDER — OXYCODONE HCL 5 MG PO TABS
ORAL_TABLET | ORAL | Status: AC
  Filled 2014-08-29: qty 1

## 2014-08-29 MED ORDER — MIDAZOLAM HCL 5 MG/5ML IJ SOLN
INTRAMUSCULAR | Status: DC | PRN
  Administered 2014-08-29: 2 mg via INTRAVENOUS

## 2014-08-29 MED ORDER — NEOSTIGMINE METHYLSULFATE 10 MG/10ML IV SOLN
INTRAVENOUS | Status: DC | PRN
  Administered 2014-08-29: 3 mg via INTRAVENOUS

## 2014-08-29 MED ORDER — VANCOMYCIN HCL 500 MG IV SOLR
INTRAVENOUS | Status: DC | PRN
  Administered 2014-08-29: 1000 mg

## 2014-08-29 MED ORDER — MIDAZOLAM HCL 2 MG/2ML IJ SOLN
INTRAMUSCULAR | Status: AC
  Filled 2014-08-29: qty 4

## 2014-08-29 MED ORDER — HYDROMORPHONE HCL 1 MG/ML IJ SOLN
0.2500 mg | INTRAMUSCULAR | Status: DC | PRN
  Administered 2014-08-29 (×4): 0.5 mg via INTRAVENOUS

## 2014-08-29 MED ORDER — HYDROMORPHONE HCL 1 MG/ML IJ SOLN
INTRAMUSCULAR | Status: AC
  Filled 2014-08-29: qty 1

## 2014-08-29 MED ORDER — OXYCODONE HCL 5 MG PO TABS
ORAL_TABLET | ORAL | Status: AC
  Filled 2014-08-29: qty 2

## 2014-08-29 MED ORDER — GLYCOPYRROLATE 0.2 MG/ML IJ SOLN
INTRAMUSCULAR | Status: AC
  Filled 2014-08-29: qty 2

## 2014-08-29 MED ORDER — HYDROMORPHONE HCL 1 MG/ML IJ SOLN
INTRAMUSCULAR | Status: AC
  Filled 2014-08-29: qty 2

## 2014-08-29 MED ORDER — ONDANSETRON HCL 4 MG/2ML IJ SOLN
INTRAMUSCULAR | Status: DC | PRN
  Administered 2014-08-29: 4 mg via INTRAVENOUS

## 2014-08-29 MED ORDER — FENTANYL CITRATE (PF) 100 MCG/2ML IJ SOLN
INTRAMUSCULAR | Status: DC | PRN
  Administered 2014-08-29 (×5): 50 ug via INTRAVENOUS
  Administered 2014-08-29: 100 ug via INTRAVENOUS
  Administered 2014-08-29 (×3): 50 ug via INTRAVENOUS

## 2014-08-29 MED ORDER — PROMETHAZINE HCL 25 MG/ML IJ SOLN
6.2500 mg | INTRAMUSCULAR | Status: DC | PRN
  Administered 2014-08-29: 12.5 mg via INTRAVENOUS

## 2014-08-29 MED ORDER — LACTATED RINGERS IV SOLN
INTRAVENOUS | Status: DC | PRN
  Administered 2014-08-29 (×2): via INTRAVENOUS

## 2014-08-29 MED ORDER — GLYCOPYRROLATE 0.2 MG/ML IJ SOLN
INTRAMUSCULAR | Status: DC | PRN
  Administered 2014-08-29: .4 mg via INTRAVENOUS

## 2014-08-29 MED ORDER — ROCURONIUM BROMIDE 50 MG/5ML IV SOLN
INTRAVENOUS | Status: AC
  Filled 2014-08-29: qty 1

## 2014-08-29 MED ORDER — PHENYLEPHRINE HCL 10 MG/ML IJ SOLN
INTRAMUSCULAR | Status: DC | PRN
  Administered 2014-08-29 (×4): 80 ug via INTRAVENOUS

## 2014-08-29 MED ORDER — 0.9 % SODIUM CHLORIDE (POUR BTL) OPTIME
TOPICAL | Status: DC | PRN
  Administered 2014-08-29: 1000 mL

## 2014-08-29 MED ORDER — TOBRAMYCIN SULFATE 1.2 G IJ SOLR
INTRAMUSCULAR | Status: AC
  Filled 2014-08-29: qty 1.2

## 2014-08-29 SURGICAL SUPPLY — 66 items
2.6 MM BEAD TIP GUIDEWIRE ×3 IMPLANT
BANDAGE ELASTIC 4 VELCRO ST LF (GAUZE/BANDAGES/DRESSINGS) ×3 IMPLANT
BANDAGE ELASTIC 6 VELCRO ST LF (GAUZE/BANDAGES/DRESSINGS) ×3 IMPLANT
BLADE SURG 10 STRL SS (BLADE) ×6 IMPLANT
BNDG GAUZE ELAST 4 BULKY (GAUZE/BANDAGES/DRESSINGS) ×3 IMPLANT
BRUSH SCRUB DISP (MISCELLANEOUS) ×6 IMPLANT
CANISTER SUCTION 2500CC (MISCELLANEOUS) ×3 IMPLANT
CANISTER WOUND CARE 500ML ATS (WOUND CARE) ×3 IMPLANT
CATH THORACIC 36FR (CATHETERS) ×3 IMPLANT
CEMENT BONE SIMPLEX SPEEDSET (Cement) ×6 IMPLANT
COVER SURGICAL LIGHT HANDLE (MISCELLANEOUS) ×6 IMPLANT
DRAPE C-ARM 42X72 X-RAY (DRAPES) ×3 IMPLANT
DRAPE C-ARMOR (DRAPES) ×3 IMPLANT
DRAPE EXTREMITY T 121X128X90 (DRAPE) IMPLANT
DRAPE IMP U-DRAPE 54X76 (DRAPES) ×3 IMPLANT
DRAPE INCISE IOBAN 66X45 STRL (DRAPES) IMPLANT
DRAPE ORTHO SPLIT 77X108 STRL (DRAPES)
DRAPE PROXIMA HALF (DRAPES) IMPLANT
DRAPE SURG ORHT 6 SPLT 77X108 (DRAPES) IMPLANT
DRAPE U-SHAPE 47X51 STRL (DRAPES) ×3 IMPLANT
DRSG ADAPTIC 3X8 NADH LF (GAUZE/BANDAGES/DRESSINGS) ×3 IMPLANT
DRSG PAD ABDOMINAL 8X10 ST (GAUZE/BANDAGES/DRESSINGS) ×6 IMPLANT
DRSG VAC ATS LRG SENSATRAC (GAUZE/BANDAGES/DRESSINGS) IMPLANT
DRSG VAC ATS MED SENSATRAC (GAUZE/BANDAGES/DRESSINGS) IMPLANT
DRSG VAC ATS SM SENSATRAC (GAUZE/BANDAGES/DRESSINGS) ×3 IMPLANT
ELECT REM PT RETURN 9FT ADLT (ELECTROSURGICAL) ×3
ELECTRODE REM PT RTRN 9FT ADLT (ELECTROSURGICAL) ×2 IMPLANT
EVACUATOR 1/8 PVC DRAIN (DRAIN) IMPLANT
GAUZE SPONGE 4X4 12PLY STRL (GAUZE/BANDAGES/DRESSINGS) ×3 IMPLANT
GAUZE XEROFORM 5X9 LF (GAUZE/BANDAGES/DRESSINGS) ×3 IMPLANT
GLOVE BIO SURGEON STRL SZ 6.5 (GLOVE) ×3 IMPLANT
GLOVE BIO SURGEON STRL SZ7.5 (GLOVE) ×3 IMPLANT
GLOVE BIO SURGEON STRL SZ8 (GLOVE) ×6 IMPLANT
GLOVE BIO SURGEON STRL SZ8.5 (GLOVE) ×3 IMPLANT
GLOVE BIOGEL PI IND STRL 7.5 (GLOVE) ×2 IMPLANT
GLOVE BIOGEL PI IND STRL 8 (GLOVE) ×4 IMPLANT
GLOVE BIOGEL PI INDICATOR 7.5 (GLOVE) ×1
GLOVE BIOGEL PI INDICATOR 8 (GLOVE) ×2
GOWN STRL REUS W/ TWL LRG LVL3 (GOWN DISPOSABLE) ×4 IMPLANT
GOWN STRL REUS W/ TWL XL LVL3 (GOWN DISPOSABLE) ×2 IMPLANT
GOWN STRL REUS W/TWL LRG LVL3 (GOWN DISPOSABLE) ×2
GOWN STRL REUS W/TWL XL LVL3 (GOWN DISPOSABLE) ×1
GUIDEWIRE 2.6X80 BEAD TIP (Wire) ×2 IMPLANT
GUIDWIRE 2.6X80 BEAD TIP (Wire) ×3 IMPLANT
KIT BASIN OR (CUSTOM PROCEDURE TRAY) ×3 IMPLANT
KIT ROOM TURNOVER OR (KITS) ×3 IMPLANT
NS IRRIG 1000ML POUR BTL (IV SOLUTION) ×3 IMPLANT
PACK GENERAL/GYN (CUSTOM PROCEDURE TRAY) ×3 IMPLANT
PACK ORTHO EXTREMITY (CUSTOM PROCEDURE TRAY) ×3 IMPLANT
PACK UNIVERSAL I (CUSTOM PROCEDURE TRAY) ×3 IMPLANT
PAD ARMBOARD 7.5X6 YLW CONV (MISCELLANEOUS) ×6 IMPLANT
SPONGE LAP 18X18 X RAY DECT (DISPOSABLE) ×3 IMPLANT
STAPLER VISISTAT 35W (STAPLE) ×3 IMPLANT
STRIP CLOSURE SKIN 1/2X4 (GAUZE/BANDAGES/DRESSINGS) ×3 IMPLANT
SUT PROLENE 1 CT (SUTURE) ×3 IMPLANT
SUT VIC AB 0 CT1 27 (SUTURE)
SUT VIC AB 0 CT1 27XBRD ANBCTR (SUTURE) IMPLANT
SUT VIC AB 2-0 CT1 27 (SUTURE)
SUT VIC AB 2-0 CT1 TAPERPNT 27 (SUTURE) IMPLANT
SUT VIC AB 2-0 CT3 27 (SUTURE) IMPLANT
TOWEL OR 17X24 6PK STRL BLUE (TOWEL DISPOSABLE) ×3 IMPLANT
TOWEL OR 17X26 10 PK STRL BLUE (TOWEL DISPOSABLE) ×6 IMPLANT
TOWER CARTRIDGE SMART MIX (DISPOSABLE) ×3 IMPLANT
TRAY FOLEY CATH 16FRSI W/METER (SET/KITS/TRAYS/PACK) ×3 IMPLANT
TUBE CONNECTING 12X1/4 (SUCTIONS) ×3 IMPLANT
YANKAUER SUCT BULB TIP NO VENT (SUCTIONS) ×6 IMPLANT

## 2014-08-29 NOTE — Anesthesia Postprocedure Evaluation (Signed)
  Anesthesia Post-op Note  Patient: Bruce Escobar  Procedure(s) Performed: Procedure(s): RIGHT TIBIAL INTRAMEDULLARY (IM) NAILING WITH ANTIBIOTIC NAIL (Right) APPLICATION OF WOUND VAC (Right)  Patient Location: PACU  Anesthesia Type:General  Level of Consciousness: awake  Airway and Oxygen Therapy: Patient Spontanous Breathing  Post-op Pain: mild  Post-op Assessment: Post-op Vital signs reviewed LLE Motor Response: Purposeful movement LLE Sensation: Full sensation RLE Motor Response: Purposeful movement (wiggles toes) RLE Sensation: Pain, Full sensation      Post-op Vital Signs: Reviewed  Last Vitals:  Filed Vitals:   08/29/14 1500  BP: 114/67  Pulse: 59  Temp:   Resp: 10    Complications: No apparent anesthesia complications

## 2014-08-29 NOTE — Progress Notes (Signed)
PT Cancellation Note  Patient Details Name: Bruce Escobar MRN: [id number] DOB: Mar 25, 1965   Cancelled Treatment:    Reason Eval/Treat Not Completed: Patient at procedure or test/unavailable. At surgery, will continue as appropriate.    Christiane Ha, PT, CSCS Pager [phone] Office [phone]  08/29/2014, 1:21 PM

## 2014-08-29 NOTE — Progress Notes (Signed)
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  We also specifically discussed the elevated risk of soft tissue breakdown that could lead to amputation.  He understood these risks and wished to proceed.  Myrene Galas, MD [facility] [phone] (p)
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
## 2014-08-29 NOTE — Anesthesia Preprocedure Evaluation (Addendum)
Anesthesia Evaluation  Patient identified by MRN, date of birth, ID band Patient awake    Reviewed: Allergy & Precautions, NPO status , Patient's Chart, lab work & pertinent test results  History of Anesthesia Complications Negative for: history of anesthetic complications  Airway Mallampati: II  TM Distance: >3 FB Neck ROM: Full    Dental  (+) Teeth Intact, Dental Advisory Given   Pulmonary neg pulmonary ROS,    Pulmonary exam normal       Cardiovascular negative cardio ROS Normal cardiovascular exam    Neuro/Psych negative neurological ROS  negative psych ROS   GI/Hepatic GERD-  ,  Endo/Other  negative endocrine ROS  Renal/GU negative Renal ROS     Musculoskeletal   Abdominal   Peds  Hematology   Anesthesia Other Findings   Reproductive/Obstetrics                            Anesthesia Physical Anesthesia Plan  ASA: II  Anesthesia Plan: General   Post-op Pain Management:    Induction: Intravenous  Airway Management Planned: Mask  Additional Equipment:   Intra-op Plan:   Post-operative Plan: Extubation in OR  Informed Consent: I have reviewed the patients History and Physical, chart, labs and discussed the procedure including the risks, benefits and alternatives for the proposed anesthesia with the patient or authorized representative who has indicated his/her understanding and acceptance.   Dental advisory given  Plan Discussed with:   Anesthesia Plan Comments:         Anesthesia Quick Evaluation

## 2014-08-29 NOTE — Brief Op Note (Signed)
08/20/2014 - 08/29/2014  2:30 PM  PATIENT:  Bruce Escobar  49 y.o. male  PRE-OPERATIVE DIAGNOSIS:   1. INFECTED NONUNION RIGHT TIBIA FRACTURE 2. OPEN WOUND 8cm X 3.5 CM X 3.5CM WITH EXPOSED BONE  POST-OPERATIVE DIAGNOSIS:   1. INFECTED NONUNION RIGHT TIBIA FRACTURE 2. OPEN WOUND 8 X 3.5 CM X 3.5CM WITH EXPOSED BONE  PROCEDURE:  Procedure(s): 1. RIGHT TIBIAL INTRAMEDULLARY (IM) NAILING WITH ANTIBIOTIC NAIL (Right) 2. PLACEMENT OF ANTIBIOTIC BEADS 3. APPLICATION OF SMALL WOUND VAC (Right)  SURGEON:  Surgeon(s) and Role:    * Myrene Galas, MD - Primary  PHYSICIAN ASSISTANT: None  ANESTHESIA:   general  I/O:  Total I/O In: 800 [I.V.:800] Out: 50 [Blood:50]  SPECIMEN:  No Specimen  TOURNIQUET:  * No tourniquets in log *  DICTATION: .Other Dictation: Dictation Number [id number]

## 2014-08-29 NOTE — Progress Notes (Signed)
Orthopedic Tech Progress Note Patient Details:  KALLAN MERRICK 1965/08/19 [id number]  Ortho Devices Type of Ortho Device:  (prafo boot) Ortho Device/Splint Interventions: Ordered As ordered by Dr. Gillian Scarce, Kaylaann Mountz 08/29/2014, 3:35 PM

## 2014-08-29 NOTE — Transfer of Care (Signed)
Immediate Anesthesia Transfer of Care Note  Patient: Bruce Escobar  Procedure(s) Performed: Procedure(s): RIGHT TIBIAL INTRAMEDULLARY (IM) NAILING WITH ANTIBIOTIC NAIL (Right) APPLICATION OF WOUND VAC (Right)  Patient Location: PACU  Anesthesia Type:General  Level of Consciousness: awake, alert  and oriented  Airway & Oxygen Therapy: Patient Spontanous Breathing  Post-op Assessment: Report given to RN and Post -op Vital signs reviewed and stable  Post vital signs: Reviewed and stable  Last Vitals:  Filed Vitals:   08/29/14 1415  BP: 119/70  Pulse: 10  Temp:   Resp: 10    Complications: No apparent anesthesia complications

## 2014-08-29 NOTE — Anesthesia Procedure Notes (Signed)
Procedure Name: Intubation Date/Time: 08/29/2014 12:26 PM Performed by: Leonel Ramsay Pre-anesthesia Checklist: Patient identified, Timeout performed, Emergency Drugs available, Suction available and Patient being monitored Patient Re-evaluated:Patient Re-evaluated prior to inductionOxygen Delivery Method: Circle system utilized Preoxygenation: Pre-oxygenation with 100% oxygen Intubation Type: IV induction Ventilation: Mask ventilation without difficulty Laryngoscope Size: Mac and 4 Grade View: Grade II Tube type: Oral Tube size: 7.5 mm Number of attempts: 1 Airway Equipment and Method: Stylet Placement Confirmation: ETT inserted through vocal cords under direct vision,  positive ETCO2 and breath sounds checked- equal and bilateral Secured at: 23 cm Tube secured with: Tape Dental Injury: Teeth and Oropharynx as per pre-operative assessment

## 2014-08-30 ENCOUNTER — Encounter (HOSPITAL_COMMUNITY): Payer: Self-pay | Admitting: Orthopedic Surgery

## 2014-08-30 LAB — CBC
HEMATOCRIT: 31.5 % — AB (ref 39.0–52.0)
Hemoglobin: 10.6 g/dL — ABNORMAL LOW (ref 13.0–17.0)
MCH: 30.7 pg (ref 26.0–34.0)
MCHC: 33.7 g/dL (ref 30.0–36.0)
MCV: 91.3 fL (ref 78.0–100.0)
Platelets: 366 10*3/uL (ref 150–400)
RBC: 3.45 MIL/uL — ABNORMAL LOW (ref 4.22–5.81)
RDW: 13.8 % (ref 11.5–15.5)
WBC: 12.8 10*3/uL — AB (ref 4.0–10.5)

## 2014-08-30 LAB — BASIC METABOLIC PANEL
ANION GAP: 6 (ref 5–15)
BUN: 8 mg/dL (ref 6–20)
CALCIUM: 8.6 mg/dL — AB (ref 8.9–10.3)
CO2: 29 mmol/L (ref 22–32)
Chloride: 100 mmol/L — ABNORMAL LOW (ref 101–111)
Creatinine, Ser: 1.12 mg/dL (ref 0.61–1.24)
GFR calc Af Amer: >60 mL/min (ref >60)
GFR calc non Af Amer: >60 mL/min (ref >60)
GLUCOSE: 122 mg/dL — AB (ref 65–99)
Potassium: 4 mmol/L (ref 3.5–5.1)
Sodium: 135 mmol/L (ref 135–145)

## 2014-08-30 MED ORDER — ENOXAPARIN SODIUM 40 MG/0.4ML ~~LOC~~ SOLN: 40.0000 mg | SUBCUTANEOUS | Status: DC

## 2014-08-30 MED ORDER — DOCUSATE SODIUM 100 MG PO CAPS: 100.0000 mg | ORAL_CAPSULE | Freq: Two times a day (BID) | ORAL | Status: AC

## 2014-08-30 MED ORDER — OXYCODONE-ACETAMINOPHEN 5-325 MG PO TABS: 1.0000 | ORAL_TABLET | Freq: Four times a day (QID) | ORAL | Status: DC | PRN

## 2014-08-30 MED ORDER — HEPARIN SOD (PORK) LOCK FLUSH 100 UNIT/ML IV SOLN
250.0000 [IU] | INTRAVENOUS | Status: AC | PRN
  Administered 2014-08-30: 250 [IU]

## 2014-08-30 MED ORDER — POLYETHYLENE GLYCOL 3350 17 G PO PACK: 17.0000 g | PACK | Freq: Every day | ORAL | Status: AC

## 2014-08-30 MED ORDER — CHOLECALCIFEROL 25 MCG (1000 UT) PO TABS: 1000.0000 [IU] | ORAL_TABLET | Freq: Two times a day (BID) | ORAL | Status: DC

## 2014-08-30 MED ORDER — OXYCODONE HCL 5 MG PO TABS: 5.0000 mg | ORAL_TABLET | Freq: Four times a day (QID) | ORAL | Status: DC | PRN

## 2014-08-30 MED ORDER — METHOCARBAMOL 500 MG PO TABS: 500.0000 mg | ORAL_TABLET | Freq: Four times a day (QID) | ORAL | Status: DC | PRN

## 2014-08-30 MED ORDER — VANCOMYCIN HCL IN DEXTROSE 1-5 GM/200ML-% IV SOLN: 1000.0000 mg | Freq: Three times a day (TID) | INTRAVENOUS | Status: DC

## 2014-08-30 MED ORDER — VITAMIN D (ERGOCALCIFEROL) 1.25 MG (50000 UNIT) PO CAPS
50000.0000 [IU] | ORAL_CAPSULE | ORAL | Status: DC
Start: 2014-08-30 — End: 2014-10-28
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
## 2014-08-30 NOTE — Progress Notes (Signed)
PT Cancellation Note  Patient Details Name: Bruce Escobar MRN: [id number] DOB: 09-04-1965   Cancelled Treatment:    Reason Eval/Treat Not Completed: Pain limiting ability to participate. Patient stating that he is in too much pain to try doing any ambulation or stairs. Stating that he is going to be going home later tonight and that he feels like he will be fine with getting into his home with the help of his ex-wife. Patient declined PT checking back at later time to attempt. States the he will be fine.     Christiane Ha, PT, CSCS Pager [phone] Office [phone]  08/30/2014, 3:33 PM

## 2014-08-30 NOTE — Progress Notes (Signed)
[facility]  Patient Status: new pt for [facility] this admission  [facility] is providing the following services: HHRN and Home Infusion Pharmacy team for home IV ABX.  Iin hospital teaching provided with pt to support self administration of IV ABX at home.  Pt also has ex-wife who will help at home.  [facility] is prepared to support DC home when ordered.  If patient discharges after hours, please call [phone].   Sedalia Muta 08/30/2014, 2:38 PM

## 2014-08-30 NOTE — Discharge Instructions (Signed)
[facility] Discharge Instructions   General Discharge Instructions  WEIGHT BEARING STATUS: nonweightbearing R leg  RANGE OF MOTION/ACTIVITY: gray boot on at all times. Ok to move knee  PAIN MEDICATION USE AND EXPECTATIONS  You have likely been given narcotic medications to help control your pain.  After a traumatic event that results in an fracture (broken bone) with or without surgery, it is ok to use narcotic pain medications to help control one's pain.  We understand that everyone responds to pain differently and each individual patient will be evaluated on a regular basis for the continued need for narcotic medications. Ideally, narcotic medication use should last no more than 6-8 weeks (coinciding with fracture healing).   As a patient it is your responsibility as well to monitor narcotic medication use and report the amount and frequency you use these medications when you come to your office visit.   We would also advise that if you are using narcotic medications, you should take a dose prior to therapy to maximize you participation.  IF YOU ARE ON NARCOTIC MEDICATIONS IT IS NOT PERMISSIBLE TO OPERATE A MOTOR VEHICLE (MOTORCYCLE/CAR/TRUCK/MOPED) OR HEAVY MACHINERY DO NOT MIX NARCOTICS WITH OTHER CNS (CENTRAL NERVOUS SYSTEM) DEPRESSANTS SUCH AS ALCOHOL  Diet: as you were eating previously.  Can use over the counter stool softeners and bowel preparations, such as Miralax, to help with bowel movements.  Narcotics can be constipating.  Be sure to drink plenty of fluids  Wound Care: do not remove vac. This will be evaluated by Dr. Genelle Gather   STOP SMOKING OR USING NICOTINE PRODUCTS!!!!  As discussed nicotine severely impairs your body's ability to heal surgical and traumatic wounds but also impairs bone healing.  Wounds and bone heal by forming microscopic blood vessels (angiogenesis) and nicotine is a vasoconstrictor (essentially, shrinks blood vessels).  Therefore, if  vasoconstriction occurs to these microscopic blood vessels they essentially disappear and are unable to deliver necessary nutrients to the healing tissue.  This is one modifiable factor that you can do to dramatically increase your chances of healing your injury.    (This means no smoking, no nicotine gum, patches, etc)  DO NOT USE NONSTEROIDAL ANTI-INFLAMMATORY DRUGS (NSAID'S)  Using products such as Advil (ibuprofen), Aleve (naproxen), Motrin (ibuprofen) for additional pain control during fracture healing can delay and/or prevent the healing response.  If you would like to take over the counter (OTC) medication, Tylenol (acetaminophen) is ok.  However, some narcotic medications that are given for pain control contain acetaminophen as well. Therefore, you should not exceed more than 4000 mg of tylenol in a day if you do not have liver disease.  Also note that there are may OTC medicines, such as cold medicines and allergy medicines that my contain tylenol as well.  If you have any questions about medications and/or interactions please ask your doctor/PA or your pharmacist.      ICE AND ELEVATE INJURED/OPERATIVE EXTREMITY  Using ice and elevating the injured extremity above your heart can help with swelling and pain control.  Icing in a pulsatile fashion, such as 20 minutes on and 20 minutes off, can be followed.    Do not place ice directly on skin. Make sure there is a barrier between to skin and the ice pack.    Using frozen items such as frozen peas works well as the conform nicely to the are that needs to be iced.  USE AN ACE WRAP OR TED HOSE FOR SWELLING CONTROL  In  addition to icing and elevation, Ace wraps or TED hose are used to help limit and resolve swelling.  It is recommended to use Ace wraps or TED hose until you are informed to stop.    When using Ace Wraps start the wrapping distally (farthest away from the body) and wrap proximally (closer to the body)   Example: If you had surgery on  your leg or thing and you do not have a splint on, start the ace wrap at the toes and work your way up to the thigh        If you had surgery on your upper extremity and do not have a splint on, start the ace wrap at your fingers and work your way up to the upper arm  IF YOU ARE IN A SPLINT OR CAST DO NOT REMOVE IT FOR ANY REASON   If your splint gets wet for any reason please contact the office immediately. You may shower in your splint or cast as long as you keep it dry.  This can be done by wrapping in a cast cover or garbage back (or similar)  Do Not stick any thing down your splint or cast such as pencils, money, or hangers to try and scratch yourself with.  If you feel itchy take benadryl as prescribed on the bottle for itching  IF YOU ARE IN A CAM BOOT (BLACK BOOT)  You may remove boot periodically. Perform daily dressing changes as noted below.  Wash the liner of the boot regularly and wear a sock when wearing the boot. It is recommended that you sleep in the boot until told otherwise  CALL THE OFFICE WITH ANY QUESTIONS OR CONCERTS: [phone]

## 2014-08-30 NOTE — Progress Notes (Signed)
Bruce Escobar discharged home with home health on IV antibiotics per MD order. Discharge instructions reviewed and discussed with patient. All questions and concerns answered. Copy of instructions and scripts given to patient. PICC line and wound vac in place.  Patient escorted to car by staff in a wheelchair. No distress noted upon discharge.   Rosita Fire 08/30/2014 4:50 PM

## 2014-08-30 NOTE — Progress Notes (Signed)
[facility]thopaedic [facility]uma [facility] Progress Note  Subjective  Doing ok Moderate pain in R leg PRAFO boot not on Eager to get out of hospital  Dip cup on bedside table    Review of Systems  Constitutional: Negative for fever and chills.  Respiratory: Negative for shortness of breath.   Cardiovascular: Negative for chest pain and orthopnea.  Gastrointestinal: Negative for nausea and vomiting.     Objective   BP 106/65 mmHg  Pulse 71  Temp(Src) 97.9 F (36.6 C) (Oral)  Resp 16  Ht 6' (1.829 m)  Wt 80.9 kg (178 lb 5.6 oz)  BMI 24.18 kg/m2  SpO2 99%  Intake/Output      08/15 0701 - 08/16 0700 08/16 0701 - 08/17 0700   P.O. 120    I.V. (mL/kg) 810 (10)    Total Intake(mL/kg) 930 (11.5)    Urine (mL/kg/hr) 1200 (0.6)    Drains 50 (0)    Blood 50 (0)    Total Output 1300     Net -370            Labs Results for Bruce Escobar, Bruce Escobar (MRN [id number]) as of 08/30/2014 10:10  Ref. Range 08/30/2014 04:30  Sodium Latest Ref Range: 135-145 mmol/L 135  Potassium Latest Ref Range: 3.5-5.1 mmol/L 4.0  Chloride Latest Ref Range: 101-111 mmol/L 100 (L)  CO2 Latest Ref Range: 22-32 mmol/L 29  BUN Latest Ref Range: 6-20 mg/dL 8  Creatinine Latest Ref Range: 0.61-1.24 mg/dL 1.12  Calcium Latest Ref Range: 8.9-10.3 mg/dL 8.6 (L)  EGFR (Non-African Amer.) Latest Ref Range: >60 mL/min >60  EGFR (African American) Latest Ref Range: >60 mL/min >60  Glucose Latest Ref Range: 65-99 mg/dL 122 (H)  Anion gap Latest Ref Range: 5-15  6  WBC Latest Ref Range: 4.0-10.5 K/uL 12.8 (H)  RBC Latest Ref Range: 4.22-5.81 MIL/uL 3.45 (L)  Hemoglobin Latest Ref Range: 13.0-17.0 g/dL 10.6 (L)  HCT Latest Ref Range: 39.0-52.0 % 31.5 (L)  MCV Latest Ref Range: 78.0-100.0 fL 91.3  MCH Latest Ref Range: 26.0-34.0 pg 30.7  MCHC Latest Ref Range: 30.0-36.0 g/dL 33.7  RDW Latest Ref Range: 11.5-15.5 % 13.8  Platelets Latest Ref Range: 150-400 K/uL 366   Tissue culture  Status: Final result     Visible to patient:   Not Released     Next appt: None              8d ago     Specimen Description TISSUE    Special Requests RIGHT TIBIA C    Gram Stain FEW WBC PRESENT, PREDOMINANTLY PMN  NO ORGANISMS SEEN  Performed at [facility]        Culture FEW METHICILLIN RESISTANT STAPHYLOCOCCUS AUREUS  Note: RIFAMPIN AND GENTAMICIN SHOULD NOT BE USED AS SINGLE DRUGS FOR TREATMENT OF STAPH INFECTIONS. This organism DOES NOT demonstrate inducible Clindamycin resistance in vitro. 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 Osteomyelitis R tibia, nonunion R tibial shaft from open fracture R tibia approximately 1 year ago   NWB R leg  prafo boot at all times  VAC to R leg  Waiting for home unit   Likely to have home unit by this afternoon  Ice and elevate  F/u with plastics at [facility] on Friday 09/02/2014  Possible flap on 09/05/2014  Will need to return to OR in 6-8 weeks for exchange nailing  6-8 weeks of IV abx, will recheck ESR and CRP in about 6 weeks  2. 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 Chronic clonus  Negative MRI of c/t/l spines   F/u with PCP, consider outpt neurology referral   4. DVT/PE prophylaxis  lovenox   Dc home with 14 days of lovenox   Will need to stop day before plastics procedure  5. Dispo  Dc home this pm after home vac arrives  Follow up with Dr. Bonnita Nasuti at [facility] on Friday 09/02/2014   Follow up with  ortho in 3-4 weeks   Jari Pigg, PA-C [facility] [phone] (O) 08/30/2014 10:08 AM

## 2014-08-30 NOTE — Progress Notes (Signed)
Orthopaedic progress note addendum   Due to plastics availability attempts were made to get pt to [facility] for initial eval today. Dr. Genelle Gather and his staff will be out of the office starting tomorrow and will not available for initial consult until next Friday.    Plastics eval is a integral component to pts care, this was stressed to the pt multiple times Extraordinary efforts were made to facilitate pts transfer to [facility] for eval. Unfortunately pt prefers not to go today and he will not be able to be seen for initial eval until 09/09/2014. After this initial eval he may not be able to have his plastics procedure until the middle of September due to OR availability   Pt will need home vac change since he will not be seen at [facility] until next week.  This change can occur on Thursday and next Tuesday  Mearl Latin, PA-C [facility] [phone] (P) 08/30/2014 1:56 PM

## 2014-08-30 NOTE — Care Management (Signed)
08/30/14  11:00 -12N   Case manager spoke with patient concerning need to get him setup with home health, placed on home vac and discharged. Patient has a 3pm appointment with Dr. Genelle Gather at [facility]. Patient stated that "no way could he make it today, he has to contact his ex-wife for a ride, wants to go home and shower and eat,and he needs to get his pain medication, states he could go tomorrow". Case manager explained that we can not schedule appointment for tomorrow, his next appointment would be 10 days out. Dr. Janee Morn also discussed importance of appointment.  11:30am Case manager spoke with Christa, PA who relayed message to Dr. Carola Frost and Mellody Dance in the OR. Case manager was asked to stress importance of keeping this appointment, primary goal is to get flap created and to avoid further infection.  11:50am : Case manager relayed Dr. Magdalene Patricia concerns and reiterated the need to get to appointment. Patient states he understands and wants to save his foot, but he can not make it today. He said he knows Dr. Carola Frost will be "angry" but he just can't do it today. Patient states that "he isn't trying to be difficult , he actually thought the appointment was later in the week". Case manager recalled Christa and asked her to inform Dr. Carola Frost of patient's final decision.

## 2014-08-30 NOTE — Op Note (Signed)
NAMEMarland Kitchen  Bruce Escobar, Bruce Escobar NO.:  [id number]  MEDICAL RECORD NO.:  [id number]  LOCATION:  5N07C                        FACILITY:  [facility]  PHYSICIAN:  Doralee Albino. Carola Frost, M.D. DATE OF BIRTH:  1965-08-17  DATE OF PROCEDURE:  08/29/2014 DATE OF DISCHARGE:                              OPERATIVE REPORT   PREOPERATIVE DIAGNOSES: 1. Infected nonunion, right tibia fracture. 2. Open wound 8 cm x 3.5 cm x 3.5 cm with exposed bone.  POSTOPERATIVE DIAGNOSES: 1. Infected nonunion, right tibia fracture. 2. Open wound 8 cm x 3.5 cm x 3.5 cm with exposed bone.  PROCEDURES: 1. Intramedullary nailing of the right tibia using an antibiotic nail. 2. Placement of antibiotic beads. 3. Application of small wound VAC.  SURGEON:  Doralee Albino. Carola Frost, M.D.  ASSISTANT:  None.  ANESTHESIA:  General.  COMPLICATIONS:  None.  TOURNIQUET:  None.  DISPOSITION:  To PACU.  CONDITION:  Stable.  BRIEF SUMMARY OF INDICATIONS FOR PROCEDURE:  Bruce Escobar is a 49- year-old male, who was undergone now 2 prior partial excisions for osteomyelitis as well as infected ingrown toenail or paronychia, and he now presents for intramedullary nailing, re-application of the wound VAC with antibiotic bead pouch and subsequent transfer to [facility] for free flap placement.  I have discussed with the patient the risks and benefits of surgery including the possibility of failure to eradicate his infection and the need for further procedures including the possibility of amputation should the infection not resolve or Ilizarov type construct combined with free flap placement.  I also discussed DVT, PE, nerve vessel injury, and many others, and he strongly wished to proceed.  BRIEF SUMMARY OF PROCEDURE:  Bruce Escobar remained on vancomycin.  He was taken to the operating room where general anesthesia was induced.  His right lower extremity was prepped and draped in usual sterile fashion. The wound had no purulence  and no significant drainage.  Erythema was well controlled.  There was some pitting edema.  A standard prep and drape was performed.  After induction of general anesthesia, beads of clonus could be identified that were similar to those seen previously, and again these were asymmetric, but these were obtained entirely while the patient was anesthetized.  After a time-out procedure removed from the knee, a small area of bone that appeared completely desiccated at the proximal edge of the wound was excised with a rongeur, which revealed a very healthy well-vascularized bleeding bone beneath it.  The 11 mm reamer was once more passed over guidewire through the knee down to the distal area and lavage performed with saline until it ran clear. This was followed by fashioning of an antibiotic cement rod through a 36- French chest tube with a 2.5 mm guidewire as rebar.  The nail was inserted after it was hardened down to the plafond and left with a hook over the edge of the cortex to the medial side of the patellar tendon so that it could be easily identified and withdrawn.  The soft tissue wound because of its size could not be completely filled with the sponge and consequently placed a small wound VAC.  The great toe nail bed  wound was also cleaned once more removing some hematoma and fibrinous material that I collected and possibly was obstructing drainage from the paronychia.  I did not identify any purulence of any kind in the entire nail bed and the nail looked healthy.  Adaptic and gauze dressings were applied from the great toe to the knee and then Kerlix and Ace wraps followed by application of a PRAFO boot to facilitate subsequent evaluation at the Plastic Surgery office.  PROGNOSIS:  Bruce Escobar will be seen this week by the Plastic Surgery office with the expectation for free flap placement on August 22nd.  The cause of clonus remains unclear, but could be due to previous  remote injury as CT scan did not identify any acute abnormality nor did the MRIs of the epidural spaces at the C, T, and L levels.  He will be nonweightbearing on the right lower extremity with unrestricted range of motion of both the knee and the ankle.  He will be on pharmacologic DVT prophylaxis with Lovenox.  He is at increased risk for persistent infection because of the location and chronicity of the wound, but with free flap placement, we are hopeful this will be mitigated by the additional blood flow provided.  Because of his absence of medical problems, he should be a good candidate, provided that he completely discontinue his dip which we have discussed, and he plans to do.  I will plan to see him back following free flap placement with the expectation for exchange IM nailing in 6 weeks or so.     Doralee Albino. Carola Frost, M.D.     MHH/MEDQ  D:  08/29/2014  T:  08/30/2014  Job:  [id number]

## 2014-08-30 NOTE — Care Management Note (Addendum)
Case Management Note  Patient Details  Name: Bruce Escobar MRN: [id number] Date of Birth: 16-Oct-1965  Subjective/Objective:    49 yr old male s/p IM Nailing with placement of a wound vac.                 Action/Plan:  Case manager faxed vac authorization form to [place]. Case manager spoke with Jeri Modena RN [facility] IV Liaison, patient has been setup with [facility] for IV antibioitcs. Patient has PICC line.      Expected Discharge Date:    08/30/14              Expected Discharge Plan:  Home w Home Health Services  In-House Referral:     Discharge planning Services  CM Consult  Post Acute Care Choice:  Durable Medical Equipment Choice offered to:  Patient  DME Arranged:  Crutches DME Agency:  [facility]  HH Arranged:   RN, IV antibiotics HH Agency:   [facility]  Status of Service: Completed.  Medicare Important Message Given:    Date Medicare IM Given:    Medicare IM give by:    Date Additional Medicare IM Given:    Additional Medicare Important Message give by:     If discussed at Long Length of Stay Meetings, dates discussed:    Additional Comments:      Durenda Guthrie, RN 08/30/2014, 10:43 AM

## 2014-09-01 DIAGNOSIS — M869 Osteomyelitis, unspecified: Secondary | ICD-10-CM | POA: Insufficient documentation

## 2014-09-02 LAB — URINE DRUGS OF ABUSE SCREEN W ALC, ROUTINE (REF LAB)
Amphetamines, Urine: NEGATIVE ng/mL
Barbiturate, Ur: NEGATIVE ng/mL
CANNABINOID QUANT UR: NEGATIVE ng/mL
COCAINE (METAB.): NEGATIVE ng/mL
Ethanol U, Quan: NEGATIVE %
Methadone Screen, Urine: NEGATIVE ng/mL
PHENCYCLIDINE, UR: NEGATIVE ng/mL
Propoxyphene, Urine: NEGATIVE ng/mL

## 2014-09-05 LAB — DRUG PROFILE 799031: BENZODIAZEPINES: NEGATIVE

## 2014-09-05 LAB — OPIATES CONFIRMATION, URINE: OPIATES: NEGATIVE

## 2014-09-09 DIAGNOSIS — S81809A Unspecified open wound, unspecified lower leg, initial encounter: Secondary | ICD-10-CM | POA: Insufficient documentation

## 2014-09-15 ENCOUNTER — Other Ambulatory Visit (HOSPITAL_COMMUNITY)
Admission: AD | Admit: 2014-09-15 | Discharge: 2014-09-15 | Disposition: A | Discharge status: Home / Self Care | Payer: 59 | Source: Other Acute Inpatient Hospital | Attending: Infectious Disease | Admitting: Infectious Disease

## 2014-09-15 DIAGNOSIS — Z5181 Encounter for therapeutic drug level monitoring: Secondary | ICD-10-CM | POA: Diagnosis not present

## 2014-09-15 DIAGNOSIS — A4902 Methicillin resistant Staphylococcus aureus infection, unspecified site: Secondary | ICD-10-CM | POA: Insufficient documentation

## 2014-09-15 LAB — BASIC METABOLIC PANEL
Anion gap: 5 (ref 5–15)
BUN: 13 mg/dL (ref 6–20)
CHLORIDE: 103 mmol/L (ref 101–111)
CO2: 30 mmol/L (ref 22–32)
Calcium: 8.7 mg/dL — ABNORMAL LOW (ref 8.9–10.3)
Creatinine, Ser: 1.12 mg/dL (ref 0.61–1.24)
GFR calc Af Amer: >60 mL/min (ref >60)
GFR calc non Af Amer: >60 mL/min (ref >60)
GLUCOSE: 101 mg/dL — AB (ref 65–99)
POTASSIUM: 4.1 mmol/L (ref 3.5–5.1)
SODIUM: 138 mmol/L (ref 135–145)

## 2014-09-19 ENCOUNTER — Other Ambulatory Visit (HOSPITAL_COMMUNITY)
Admission: RE | Admit: 2014-09-19 | Discharge: 2014-09-19 | Disposition: A | Discharge status: Home / Self Care | Payer: 59 | Source: Ambulatory Visit | Attending: Infectious Disease | Admitting: Infectious Disease

## 2014-09-19 DIAGNOSIS — A4189 Other specified sepsis: Secondary | ICD-10-CM | POA: Diagnosis not present

## 2014-09-19 DIAGNOSIS — T84622A Infection and inflammatory reaction due to internal fixation device of right tibia, initial encounter: Secondary | ICD-10-CM | POA: Diagnosis present

## 2014-09-19 LAB — BASIC METABOLIC PANEL
Anion gap: 4 — ABNORMAL LOW (ref 5–15)
BUN: 13 mg/dL (ref 6–20)
CHLORIDE: 104 mmol/L (ref 101–111)
CO2: 29 mmol/L (ref 22–32)
CREATININE: 1.03 mg/dL (ref 0.61–1.24)
Calcium: 8.8 mg/dL — ABNORMAL LOW (ref 8.9–10.3)
Glucose, Bld: 89 mg/dL (ref 65–99)
POTASSIUM: 4 mmol/L (ref 3.5–5.1)
SODIUM: 137 mmol/L (ref 135–145)

## 2014-09-19 LAB — CBC WITH DIFFERENTIAL/PLATELET
BASOS ABS: 0.1 10*3/uL (ref 0.0–0.1)
Basophils Relative: 1 % (ref 0–1)
EOS ABS: 0.5 10*3/uL (ref 0.0–0.7)
Eosinophils Relative: 10 % — ABNORMAL HIGH (ref 0–5)
HCT: 33.8 % — ABNORMAL LOW (ref 39.0–52.0)
HEMOGLOBIN: 11.3 g/dL — AB (ref 13.0–17.0)
LYMPHS ABS: 1.1 10*3/uL (ref 0.7–4.0)
Lymphocytes Relative: 25 % (ref 12–46)
MCH: 30.5 pg (ref 26.0–34.0)
MCHC: 33.4 g/dL (ref 30.0–36.0)
MCV: 91.1 fL (ref 78.0–100.0)
Monocytes Absolute: 0.7 10*3/uL (ref 0.1–1.0)
Monocytes Relative: 16 % — ABNORMAL HIGH (ref 3–12)
NEUTROS PCT: 48 % (ref 43–77)
Neutro Abs: 2.2 10*3/uL (ref 1.7–7.7)
PLATELETS: 192 10*3/uL (ref 150–400)
RBC: 3.71 MIL/uL — AB (ref 4.22–5.81)
RDW: 13.6 % (ref 11.5–15.5)
WBC: 4.5 10*3/uL (ref 4.0–10.5)

## 2014-09-19 LAB — VANCOMYCIN, TROUGH: VANCOMYCIN TR: 18 ug/mL (ref 10.0–20.0)

## 2014-09-22 DIAGNOSIS — T8579XA Infection and inflammatory reaction due to other internal prosthetic devices, implants and grafts, initial encounter: Secondary | ICD-10-CM | POA: Insufficient documentation

## 2014-09-22 DIAGNOSIS — S82209B Unspecified fracture of shaft of unspecified tibia, initial encounter for open fracture type I or II: Secondary | ICD-10-CM | POA: Insufficient documentation

## 2014-09-22 DIAGNOSIS — A4902 Methicillin resistant Staphylococcus aureus infection, unspecified site: Secondary | ICD-10-CM | POA: Insufficient documentation

## 2014-09-22 DIAGNOSIS — S62013A Displaced fracture of distal pole of navicular [scaphoid] bone of unspecified wrist, initial encounter for closed fracture: Secondary | ICD-10-CM | POA: Insufficient documentation

## 2014-09-22 DIAGNOSIS — M19039 Primary osteoarthritis, unspecified wrist: Secondary | ICD-10-CM | POA: Insufficient documentation

## 2014-10-04 ENCOUNTER — Inpatient Hospital Stay: Payer: 59 | Admitting: Infectious Disease

## 2014-10-20 DIAGNOSIS — Z9889 Other specified postprocedural states: Secondary | ICD-10-CM | POA: Insufficient documentation

## 2014-10-20 NOTE — Op Note (Signed)
NAME:  RAHM, MINIX NO.:  [id number]  MEDICAL RECORD NO.:  [id number]  LOCATION:  VASC                         FACILITY:  [facility]  PHYSICIAN:  Doralee Albino. Harshita Bernales, M.D. DATE OF BIRTH:  September 27, 1965  DATE OF PROCEDURE:  08/20/2014 DATE OF DISCHARGE:  08/20/2014                              OPERATIVE REPORT   ADDENDUM:  ADDENDUM TO THE BRIEF SUMMARY OF PROCEDURE:  After treatment of the infected tibia, attention was turned distally to the right foot or the great toe, nail was inspected and was found to have extensive purulent material with both paronychia as well as an eponychia which appeared chronic.  The nail was soft and had extensive chronic soft tissue infection along the entire medial side of the great toenail.  I elevated the nail out of the bed distally. I then performed a longitudinal incision off the corner of the eponychia and reflected this tissue proximally, exposing the germinal matrix.    The medial 1/4th to 1/3rd of the nail all the way back to the germinal matrix was then excised.  This underlying nail bed tissue again was in very poor condition. Purulence was expressed and the tissue was then scraped with a curette. I then performed a formal nailbed ablation, removing the germinal matrix tissue with a curette quite aggressively medially.  It was irrigated thoroughly and then a non-stick dressing applied.  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     Doralee Albino. Carola Frost, M.D.     MHH/MEDQ  D:  10/19/2014  T:  10/20/2014  Job:  [id number]

## 2014-10-25 ENCOUNTER — Ambulatory Visit (INDEPENDENT_AMBULATORY_CARE_PROVIDER_SITE_OTHER): Payer: 59 | Admitting: Infectious Disease

## 2014-10-25 ENCOUNTER — Encounter: Payer: Self-pay | Admitting: Infectious Disease

## 2014-10-25 VITALS — BP 142/100 | HR 81 | Temp 97.8°F | Wt 179.0 lb

## 2014-10-25 DIAGNOSIS — S8290XM Unspecified fracture of unspecified lower leg, subsequent encounter for open fracture type I or II with nonunion: Secondary | ICD-10-CM

## 2014-10-25 DIAGNOSIS — S82202G Unspecified fracture of shaft of left tibia, subsequent encounter for closed fracture with delayed healing: Secondary | ICD-10-CM

## 2014-10-25 DIAGNOSIS — A4902 Methicillin resistant Staphylococcus aureus infection, unspecified site: Secondary | ICD-10-CM

## 2014-10-25 DIAGNOSIS — T847XXD Infection and inflammatory reaction due to other internal orthopedic prosthetic devices, implants and grafts, subsequent encounter: Secondary | ICD-10-CM | POA: Diagnosis not present

## 2014-10-25 DIAGNOSIS — A419 Sepsis, unspecified organism: Secondary | ICD-10-CM

## 2014-10-25 DIAGNOSIS — S82409M Unspecified fracture of shaft of unspecified fibula, subsequent encounter for open fracture type I or II with nonunion: Secondary | ICD-10-CM

## 2014-10-25 DIAGNOSIS — S82209M Unspecified fracture of shaft of unspecified tibia, subsequent encounter for open fracture type I or II with nonunion: Secondary | ICD-10-CM

## 2014-10-25 DIAGNOSIS — T8579XD Infection and inflammatory reaction due to other internal prosthetic devices, implants and grafts, subsequent encounter: Secondary | ICD-10-CM

## 2014-10-25 DIAGNOSIS — L0291 Cutaneous abscess, unspecified: Secondary | ICD-10-CM

## 2014-10-25 DIAGNOSIS — IMO0001 Reserved for inherently not codable concepts without codable children: Secondary | ICD-10-CM

## 2014-10-25 MED ORDER — DOXYCYCLINE HYCLATE 100 MG PO TABS: 100.0000 mg | ORAL_TABLET | Freq: Two times a day (BID) | ORAL | Status: AC

## 2014-10-25 NOTE — Progress Notes (Signed)
Subjective:    Patient ID: Bruce Escobar, male    DOB: 1965-05-17, 49 y.o.   MRN: [id number]  HPI  49 y.o. 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 Carola Frost with surgery on 08/22/14:  1. PARTIAL EXCISION OF RIGHT TIBIA 2. REMOVAL OF INTRAMEDULLARY TIBIAL NAIL 3. PARTIAL NAIL ABLATION AND PARTIAL EXCISION OF NAIL FROM RIGHT GREAT TOE (Right) 4. PLACEMENT OF SMALL WOUND VAC 5. PLACEMENT OF ANTIBIOTIC BEADS  Surgery 8/11/6:  1. REPEAT PARTIAL EXCISION OF RIGHT TIBIA WITH CURETTAGE AND REAMING (Right) 2. PLACEMENT OF ANTIBIOTIC BEADS 3. SMALL WOUND VAC  Surgery 08/26/14  1. Repeat partial excision of right tibia with curettage and  intramedullary reaming. 2. Placement of antibiotic beads. 3. Application of small wound VAC.  Surgery 08/29/14:  1. RIGHT TIBIAL INTRAMEDULLARY (IM) NAILING WITH ANTIBIOTIC NAIL (Right) 2. PLACEMENT OF ANTIBIOTIC BEADS 3. APPLICATION OF SMALL WOUND VAC (Right)  Patient was maintained on IV vancomcyin thru present date.  In the interim he went to [facility] where he had plastic surgery   With Dr. Genelle Gather on 09/26/14 with :   FREE SKIN FLAP WITH MICROVASCULAR ANASTOMOSIS; LEG- Radial forearm free flap to leg SPLIT THICKNESS SKIN GRAFT Leg MUSCLE, MYOCUTANEOUS, OR FASCIOCUTANEOUS FLAP; LOWER EXTREMITY   His pain in his leg is dramatically improved and plastic surgery graft site is doing well as are other two sites. No fevers or evidence of uncontrolled infection.   Review of Systems  Constitutional: Negative for fever, chills, diaphoresis, activity change, appetite change, fatigue and unexpected weight change.  HENT: Negative for congestion, rhinorrhea, sinus pressure, sneezing, sore throat and trouble swallowing.   Eyes: Negative for photophobia and visual disturbance.  Respiratory: Negative for cough, chest tightness, shortness of breath, wheezing and stridor.   Cardiovascular:  Negative for chest pain, palpitations and leg swelling.  Gastrointestinal: Negative for nausea, vomiting, abdominal pain, diarrhea, constipation, blood in stool, abdominal distention and anal bleeding.  Genitourinary: Negative for dysuria, hematuria, flank pain and difficulty urinating.  Musculoskeletal: Negative for myalgias, back pain, joint swelling, arthralgias and gait problem.  Skin: Positive for wound. Negative for color change, pallor and rash.  Neurological: Negative for dizziness, tremors, weakness and light-headedness.  Hematological: Negative for adenopathy. Does not bruise/bleed easily.  Psychiatric/Behavioral: Negative for behavioral problems, confusion, sleep disturbance, dysphoric mood, decreased concentration and agitation.       Objective:   Physical Exam  Constitutional: He is oriented to person, place, and time. He appears well-developed and well-nourished.  HENT:  Head: Normocephalic and atraumatic.  Eyes: Conjunctivae and EOM are normal.  Neck: Normal range of motion. Neck supple.  Cardiovascular: Normal rate and regular rhythm.   Pulmonary/Chest: Effort normal. No respiratory distress. He has no wheezes.  Abdominal: Soft. He exhibits no distension.  Musculoskeletal: Normal range of motion. He exhibits no edema or tenderness.  Neurological: He is alert and oriented to person, place, and time.  Skin: Skin is warm and dry.  Psychiatric: He has a normal mood and affect. His behavior is normal. Judgment and thought content normal.    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 Carola Frost tomorrow and he is anxious to proceed with further surgery but  I cautioned him against rushing to go for surgery in case there is any residual infection  I asked him to have Dr. Carola Frost call me tomorrow when  He sees the pt  FU in next 1 month  I spent greater than 40 minutes with the patient including greater than 50% of time in face to face counsel of the patient re his hardware associated MRSA osteomyelitis of tibia, plastic surgery and in coordination of their care.

## 2014-10-26 NOTE — Progress Notes (Signed)
Unable to reach pt by phone, left pre-op instructions on pt's voicemail.

## 2014-10-27 ENCOUNTER — Inpatient Hospital Stay (HOSPITAL_COMMUNITY): Payer: 59

## 2014-10-27 ENCOUNTER — Inpatient Hospital Stay (HOSPITAL_COMMUNITY): Payer: 59 | Admitting: Critical Care Medicine

## 2014-10-27 ENCOUNTER — Encounter (HOSPITAL_COMMUNITY): Payer: Self-pay | Admitting: *Deleted

## 2014-10-27 ENCOUNTER — Inpatient Hospital Stay (HOSPITAL_COMMUNITY)
Admission: RE | Admit: 2014-10-27 | Discharge: 2014-10-29 | DRG: 493 | Disposition: A | Discharge status: Home / Self Care | Payer: 59 | Source: Ambulatory Visit | Attending: Orthopedic Surgery | Admitting: Orthopedic Surgery

## 2014-10-27 ENCOUNTER — Encounter (HOSPITAL_COMMUNITY)
Admission: RE | Disposition: A | Discharge status: Home / Self Care | Payer: Self-pay | Source: Ambulatory Visit | Attending: Orthopedic Surgery

## 2014-10-27 DIAGNOSIS — Z881 Allergy status to other antibiotic agents status: Secondary | ICD-10-CM | POA: Diagnosis not present

## 2014-10-27 DIAGNOSIS — B9562 Methicillin resistant Staphylococcus aureus infection as the cause of diseases classified elsewhere: Secondary | ICD-10-CM | POA: Diagnosis present

## 2014-10-27 DIAGNOSIS — F17200 Nicotine dependence, unspecified, uncomplicated: Secondary | ICD-10-CM | POA: Diagnosis present

## 2014-10-27 DIAGNOSIS — N289 Disorder of kidney and ureter, unspecified: Secondary | ICD-10-CM | POA: Diagnosis not present

## 2014-10-27 DIAGNOSIS — Z792 Long term (current) use of antibiotics: Secondary | ICD-10-CM | POA: Diagnosis not present

## 2014-10-27 DIAGNOSIS — Z880 Allergy status to penicillin: Secondary | ICD-10-CM | POA: Diagnosis not present

## 2014-10-27 DIAGNOSIS — S82261N Displaced segmental fracture of shaft of right tibia, subsequent encounter for open fracture type IIIA, IIIB, or IIIC with nonunion: Secondary | ICD-10-CM | POA: Diagnosis present

## 2014-10-27 DIAGNOSIS — S82209B Unspecified fracture of shaft of unspecified tibia, initial encounter for open fracture type I or II: Secondary | ICD-10-CM | POA: Diagnosis present

## 2014-10-27 DIAGNOSIS — S82201N Unspecified fracture of shaft of right tibia, subsequent encounter for open fracture type IIIA, IIIB, or IIIC with nonunion: Secondary | ICD-10-CM

## 2014-10-27 DIAGNOSIS — Z79899 Other long term (current) drug therapy: Secondary | ICD-10-CM

## 2014-10-27 DIAGNOSIS — Z886 Allergy status to analgesic agent status: Secondary | ICD-10-CM

## 2014-10-27 DIAGNOSIS — M869 Osteomyelitis, unspecified: Secondary | ICD-10-CM | POA: Diagnosis present

## 2014-10-27 DIAGNOSIS — Z419 Encounter for procedure for purposes other than remedying health state, unspecified: Secondary | ICD-10-CM

## 2014-10-27 DIAGNOSIS — M1990 Unspecified osteoarthritis, unspecified site: Secondary | ICD-10-CM | POA: Diagnosis present

## 2014-10-27 DIAGNOSIS — T148XXA Other injury of unspecified body region, initial encounter: Secondary | ICD-10-CM

## 2014-10-27 DIAGNOSIS — A4902 Methicillin resistant Staphylococcus aureus infection, unspecified site: Secondary | ICD-10-CM | POA: Diagnosis present

## 2014-10-27 DIAGNOSIS — K219 Gastro-esophageal reflux disease without esophagitis: Secondary | ICD-10-CM | POA: Diagnosis present

## 2014-10-27 HISTORY — DX: Nicotine dependence, unspecified, uncomplicated: F17.200

## 2014-10-27 HISTORY — DX: Displaced segmental fracture of shaft of right tibia, subsequent encounter for open fracture type IIIA, IIIB, or IIIC with nonunion: S82.261N

## 2014-10-27 HISTORY — PX: TIBIA IM NAIL INSERTION: SHX2516

## 2014-10-27 HISTORY — PX: HARDWARE REMOVAL: SHX979

## 2014-10-27 LAB — COMPREHENSIVE METABOLIC PANEL
ALT: 14 U/L — AB (ref 17–63)
AST: 17 U/L (ref 15–41)
Albumin: 3.3 g/dL — ABNORMAL LOW (ref 3.5–5.0)
Alkaline Phosphatase: 95 U/L (ref 38–126)
Anion gap: 7 (ref 5–15)
BUN: 13 mg/dL (ref 6–20)
CO2: 24 mmol/L (ref 22–32)
CREATININE: 1.34 mg/dL — AB (ref 0.61–1.24)
Calcium: 9.2 mg/dL (ref 8.9–10.3)
Chloride: 110 mmol/L (ref 101–111)
GFR calc non Af Amer: >60 mL/min (ref >60)
Glucose, Bld: 96 mg/dL (ref 65–99)
Potassium: 4.3 mmol/L (ref 3.5–5.1)
SODIUM: 141 mmol/L (ref 135–145)
Total Bilirubin: 0.9 mg/dL (ref 0.3–1.2)
Total Protein: 6.4 g/dL — ABNORMAL LOW (ref 6.5–8.1)

## 2014-10-27 LAB — CBC WITH DIFFERENTIAL/PLATELET
Basophils Absolute: 0 10*3/uL (ref 0.0–0.1)
Basophils Relative: 1 %
EOS PCT: 5 %
Eosinophils Absolute: 0.3 10*3/uL (ref 0.0–0.7)
HEMATOCRIT: 32.2 % — AB (ref 39.0–52.0)
Hemoglobin: 10.5 g/dL — ABNORMAL LOW (ref 13.0–17.0)
LYMPHS ABS: 0.8 10*3/uL (ref 0.7–4.0)
LYMPHS PCT: 13 %
MCH: 28.5 pg (ref 26.0–34.0)
MCHC: 32.6 g/dL (ref 30.0–36.0)
MCV: 87.5 fL (ref 78.0–100.0)
MONO ABS: 0.6 10*3/uL (ref 0.1–1.0)
Monocytes Relative: 10 %
Neutro Abs: 4.5 10*3/uL (ref 1.7–7.7)
Neutrophils Relative %: 71 %
PLATELETS: 272 10*3/uL (ref 150–400)
RBC: 3.68 MIL/uL — AB (ref 4.22–5.81)
RDW: 13.4 % (ref 11.5–15.5)
WBC: 6.3 10*3/uL (ref 4.0–10.5)

## 2014-10-27 LAB — C-REACTIVE PROTEIN: CRP: 0.7 mg/dL (ref <1.0)

## 2014-10-27 LAB — GRAM STAIN

## 2014-10-27 LAB — SURGICAL PCR SCREEN
MRSA, PCR: NEGATIVE
Staphylococcus aureus: NEGATIVE

## 2014-10-27 LAB — PROTIME-INR
INR: 1.14 (ref 0.00–1.49)
Prothrombin Time: 14.8 seconds (ref 11.6–15.2)

## 2014-10-27 LAB — APTT: aPTT: 29 seconds (ref 24–37)

## 2014-10-27 LAB — SEDIMENTATION RATE: Sed Rate: 32 mm/hr — ABNORMAL HIGH (ref 0–16)

## 2014-10-27 SURGERY — INSERTION, INTRAMEDULLARY ROD, TIBIA
Anesthesia: General | Site: Leg Lower | Laterality: Right

## 2014-10-27 MED ORDER — GLYCOPYRROLATE 0.2 MG/ML IJ SOLN
INTRAMUSCULAR | Status: DC | PRN
  Administered 2014-10-27: .4 mg via INTRAVENOUS
  Administered 2014-10-27: 0.1 mg via INTRAVENOUS

## 2014-10-27 MED ORDER — ONDANSETRON HCL 4 MG/2ML IJ SOLN: 4.0000 mg | Freq: Four times a day (QID) | INTRAMUSCULAR | Status: DC | PRN

## 2014-10-27 MED ORDER — VANCOMYCIN HCL IN DEXTROSE 1-5 GM/200ML-% IV SOLN
INTRAVENOUS | Status: AC
  Administered 2014-10-27: 1000 mg via INTRAVENOUS
  Filled 2014-10-27: qty 200

## 2014-10-27 MED ORDER — MIDAZOLAM HCL 5 MG/5ML IJ SOLN
INTRAMUSCULAR | Status: DC | PRN
  Administered 2014-10-27: 2 mg via INTRAVENOUS

## 2014-10-27 MED ORDER — BUPIVACAINE LIPOSOME 1.3 % IJ SUSP
INTRAMUSCULAR | Status: DC | PRN
  Administered 2014-10-27: 20 mL

## 2014-10-27 MED ORDER — OXYCODONE HCL 5 MG PO TABS
5.0000 mg | ORAL_TABLET | ORAL | Status: DC | PRN
  Administered 2014-10-27 – 2014-10-29 (×8): 10 mg via ORAL
  Filled 2014-10-27 (×8): qty 2

## 2014-10-27 MED ORDER — ONDANSETRON HCL 4 MG/2ML IJ SOLN
INTRAMUSCULAR | Status: DC | PRN
Start: 2014-10-27 — End: 2014-10-27
  Administered 2014-10-27: 4 mg via INTRAVENOUS

## 2014-10-27 MED ORDER — PHENYLEPHRINE 40 MCG/ML (10ML) SYRINGE FOR IV PUSH (FOR BLOOD PRESSURE SUPPORT)
PREFILLED_SYRINGE | INTRAVENOUS | Status: AC
  Filled 2014-10-27: qty 20

## 2014-10-27 MED ORDER — SODIUM CHLORIDE 0.9 % IR SOLN
Status: DC | PRN
  Administered 2014-10-27: 3000 mL

## 2014-10-27 MED ORDER — METHOCARBAMOL 500 MG PO TABS
ORAL_TABLET | ORAL | Status: AC
  Filled 2014-10-27: qty 1

## 2014-10-27 MED ORDER — ROCURONIUM BROMIDE 100 MG/10ML IV SOLN
INTRAVENOUS | Status: DC | PRN
  Administered 2014-10-27 (×2): 10 mg via INTRAVENOUS
  Administered 2014-10-27: 50 mg via INTRAVENOUS

## 2014-10-27 MED ORDER — BUPIVACAINE LIPOSOME 1.3 % IJ SUSP
20.0000 mL | Freq: Once | INTRAMUSCULAR | Status: DC
  Filled 2014-10-27: qty 20

## 2014-10-27 MED ORDER — METOCLOPRAMIDE HCL 5 MG PO TABS: 5.0000 mg | ORAL_TABLET | Freq: Three times a day (TID) | ORAL | Status: DC | PRN

## 2014-10-27 MED ORDER — DEXAMETHASONE SODIUM PHOSPHATE 4 MG/ML IJ SOLN
INTRAMUSCULAR | Status: DC | PRN
  Administered 2014-10-27: 4 mg via INTRAVENOUS

## 2014-10-27 MED ORDER — DOXYCYCLINE HYCLATE 100 MG PO TABS
100.0000 mg | ORAL_TABLET | Freq: Two times a day (BID) | ORAL | Status: DC
  Administered 2014-10-27 – 2014-10-29 (×4): 100 mg via ORAL
  Filled 2014-10-27 (×4): qty 1

## 2014-10-27 MED ORDER — HYDROMORPHONE 0.3 MG/ML IV SOLN
INTRAVENOUS | Status: DC
  Administered 2014-10-27: 0.9 mg via INTRAVENOUS
  Administered 2014-10-27 – 2014-10-28 (×2): via INTRAVENOUS
  Administered 2014-10-28: 1.5 mg via INTRAVENOUS
  Administered 2014-10-28: 0.6 mg via INTRAVENOUS
  Administered 2014-10-28: 2.7 mg via INTRAVENOUS
  Administered 2014-10-28: 1.2 mg via INTRAVENOUS
  Filled 2014-10-27: qty 25

## 2014-10-27 MED ORDER — OXYCODONE HCL 5 MG PO TABS
ORAL_TABLET | ORAL | Status: AC
  Filled 2014-10-27: qty 2

## 2014-10-27 MED ORDER — DIPHENHYDRAMINE HCL 50 MG/ML IJ SOLN: 12.5000 mg | Freq: Four times a day (QID) | INTRAMUSCULAR | Status: DC | PRN

## 2014-10-27 MED ORDER — LACTATED RINGERS IV SOLN
INTRAVENOUS | Status: DC
  Administered 2014-10-27 (×3): via INTRAVENOUS

## 2014-10-27 MED ORDER — MUPIROCIN 2 % EX OINT
1.0000 "application " | TOPICAL_OINTMENT | Freq: Once | CUTANEOUS | Status: AC
  Administered 2014-10-27: 1 via TOPICAL

## 2014-10-27 MED ORDER — HYDROMORPHONE HCL 1 MG/ML IJ SOLN
0.2500 mg | INTRAMUSCULAR | Status: DC | PRN
  Administered 2014-10-27 (×4): 0.5 mg via INTRAVENOUS

## 2014-10-27 MED ORDER — FENTANYL CITRATE (PF) 250 MCG/5ML IJ SOLN
INTRAMUSCULAR | Status: AC
  Filled 2014-10-27: qty 5

## 2014-10-27 MED ORDER — MUPIROCIN 2 % EX OINT
TOPICAL_OINTMENT | CUTANEOUS | Status: AC
  Administered 2014-10-27: 1 via TOPICAL
  Filled 2014-10-27: qty 22

## 2014-10-27 MED ORDER — HYDROMORPHONE HCL 1 MG/ML IJ SOLN
0.5000 mg | INTRAMUSCULAR | Status: DC | PRN
  Administered 2014-10-28: 1 mg via INTRAVENOUS
  Filled 2014-10-27: qty 1

## 2014-10-27 MED ORDER — VITAMIN C 500 MG PO TABS
500.0000 mg | ORAL_TABLET | Freq: Every day | ORAL | Status: DC
  Administered 2014-10-28 – 2014-10-29 (×2): 500 mg via ORAL
  Filled 2014-10-27 (×2): qty 1

## 2014-10-27 MED ORDER — EPHEDRINE SULFATE 50 MG/ML IJ SOLN
INTRAMUSCULAR | Status: DC | PRN
  Administered 2014-10-27: 5 mg via INTRAVENOUS

## 2014-10-27 MED ORDER — GABAPENTIN 100 MG PO CAPS
100.0000 mg | ORAL_CAPSULE | Freq: Every day | ORAL | Status: DC
  Administered 2014-10-28: 100 mg via ORAL
  Filled 2014-10-27 (×2): qty 1

## 2014-10-27 MED ORDER — HYDROMORPHONE HCL 1 MG/ML IJ SOLN
INTRAMUSCULAR | Status: AC
  Filled 2014-10-27: qty 2

## 2014-10-27 MED ORDER — METOCLOPRAMIDE HCL 5 MG/ML IJ SOLN
5.0000 mg | Freq: Three times a day (TID) | INTRAMUSCULAR | Status: DC | PRN
Start: 2014-10-27 — End: 2014-10-29

## 2014-10-27 MED ORDER — METHOCARBAMOL 500 MG PO TABS
500.0000 mg | ORAL_TABLET | Freq: Four times a day (QID) | ORAL | Status: DC | PRN
Start: 2014-10-27 — End: 2014-10-29
  Administered 2014-10-27 – 2014-10-28 (×3): 500 mg via ORAL
  Administered 2014-10-28: 1000 mg via ORAL
  Administered 2014-10-28: 500 mg via ORAL
  Administered 2014-10-29: 1000 mg via ORAL
  Filled 2014-10-27 (×2): qty 1
  Filled 2014-10-27: qty 2
  Filled 2014-10-27: qty 1
  Filled 2014-10-27: qty 2

## 2014-10-27 MED ORDER — POTASSIUM CHLORIDE IN NACL 20-0.9 MEQ/L-% IV SOLN
INTRAVENOUS | Status: DC
  Administered 2014-10-27: 20:00:00 via INTRAVENOUS
  Filled 2014-10-27: qty 1000

## 2014-10-27 MED ORDER — ACETAMINOPHEN 325 MG PO TABS
650.0000 mg | ORAL_TABLET | Freq: Four times a day (QID) | ORAL | Status: DC | PRN
  Administered 2014-10-29: 650 mg via ORAL
  Filled 2014-10-27: qty 2

## 2014-10-27 MED ORDER — MIDAZOLAM HCL 2 MG/2ML IJ SOLN
INTRAMUSCULAR | Status: AC
  Filled 2014-10-27: qty 4

## 2014-10-27 MED ORDER — HYDROMORPHONE 0.3 MG/ML IV SOLN
INTRAVENOUS | Status: AC
  Administered 2014-10-27: 2 mg
  Filled 2014-10-27: qty 25

## 2014-10-27 MED ORDER — PHENYLEPHRINE HCL 10 MG/ML IJ SOLN
INTRAMUSCULAR | Status: DC | PRN
  Administered 2014-10-27 (×6): 80 ug via INTRAVENOUS

## 2014-10-27 MED ORDER — MAGNESIUM CITRATE PO SOLN: 1.0000 | Freq: Once | ORAL | Status: DC | PRN

## 2014-10-27 MED ORDER — SODIUM CHLORIDE 0.9 % IJ SOLN: 9.0000 mL | INTRAMUSCULAR | Status: DC | PRN

## 2014-10-27 MED ORDER — NEOSTIGMINE METHYLSULFATE 10 MG/10ML IV SOLN
INTRAVENOUS | Status: DC | PRN
  Administered 2014-10-27: 3 mg via INTRAVENOUS

## 2014-10-27 MED ORDER — DIPHENHYDRAMINE HCL 12.5 MG/5ML PO ELIX: 12.5000 mg | ORAL_SOLUTION | Freq: Four times a day (QID) | ORAL | Status: DC | PRN

## 2014-10-27 MED ORDER — 0.9 % SODIUM CHLORIDE (POUR BTL) OPTIME
TOPICAL | Status: DC | PRN
  Administered 2014-10-27: 1000 mL

## 2014-10-27 MED ORDER — LIDOCAINE HCL (CARDIAC) 20 MG/ML IV SOLN
INTRAVENOUS | Status: DC | PRN
  Administered 2014-10-27: 60 mg via INTRAVENOUS

## 2014-10-27 MED ORDER — DEXTROSE 5 % IV SOLN
500.0000 mg | Freq: Four times a day (QID) | INTRAVENOUS | Status: DC | PRN
  Filled 2014-10-27: qty 5

## 2014-10-27 MED ORDER — DOCUSATE SODIUM 100 MG PO CAPS
100.0000 mg | ORAL_CAPSULE | Freq: Two times a day (BID) | ORAL | Status: DC
  Administered 2014-10-27 – 2014-10-29 (×4): 100 mg via ORAL
  Filled 2014-10-27 (×4): qty 1

## 2014-10-27 MED ORDER — VANCOMYCIN HCL IN DEXTROSE 1-5 GM/200ML-% IV SOLN
1000.0000 mg | Freq: Two times a day (BID) | INTRAVENOUS | Status: AC
  Administered 2014-10-27: 1000 mg via INTRAVENOUS
  Filled 2014-10-27: qty 200

## 2014-10-27 MED ORDER — ONDANSETRON HCL 4 MG PO TABS: 4.0000 mg | ORAL_TABLET | Freq: Four times a day (QID) | ORAL | Status: DC | PRN

## 2014-10-27 MED ORDER — DEXAMETHASONE SODIUM PHOSPHATE 4 MG/ML IJ SOLN
INTRAMUSCULAR | Status: AC
  Filled 2014-10-27: qty 1

## 2014-10-27 MED ORDER — OXYCODONE-ACETAMINOPHEN 5-325 MG PO TABS
1.0000 | ORAL_TABLET | Freq: Four times a day (QID) | ORAL | Status: DC | PRN
  Administered 2014-10-27 – 2014-10-29 (×5): 2 via ORAL
  Filled 2014-10-27 (×5): qty 2

## 2014-10-27 MED ORDER — HEMOSTATIC AGENTS (NO CHARGE) OPTIME
TOPICAL | Status: DC | PRN
  Administered 2014-10-27: 1 via TOPICAL

## 2014-10-27 MED ORDER — PROPOFOL 10 MG/ML IV BOLUS
INTRAVENOUS | Status: DC | PRN
  Administered 2014-10-27: 140 mg via INTRAVENOUS

## 2014-10-27 MED ORDER — FENTANYL CITRATE (PF) 250 MCG/5ML IJ SOLN
INTRAMUSCULAR | Status: DC | PRN
  Administered 2014-10-27: 50 ug via INTRAVENOUS
  Administered 2014-10-27: 25 ug via INTRAVENOUS
  Administered 2014-10-27 (×2): 50 ug via INTRAVENOUS
  Administered 2014-10-27: 25 ug via INTRAVENOUS
  Administered 2014-10-27 (×2): 50 ug via INTRAVENOUS
  Administered 2014-10-27 (×2): 25 ug via INTRAVENOUS
  Administered 2014-10-27 (×3): 50 ug via INTRAVENOUS

## 2014-10-27 MED ORDER — VITAMIN D (ERGOCALCIFEROL) 1.25 MG (50000 UNIT) PO CAPS
50000.0000 [IU] | ORAL_CAPSULE | ORAL | Status: DC
  Administered 2014-10-27: 50000 [IU] via ORAL
  Filled 2014-10-27: qty 1

## 2014-10-27 MED ORDER — NALOXONE HCL 0.4 MG/ML IJ SOLN: 0.4000 mg | INTRAMUSCULAR | Status: DC | PRN

## 2014-10-27 MED ORDER — POLYETHYLENE GLYCOL 3350 17 G PO PACK
17.0000 g | PACK | Freq: Every day | ORAL | Status: DC
  Administered 2014-10-28: 17 g via ORAL
  Filled 2014-10-27 (×2): qty 1

## 2014-10-27 MED ORDER — ACETAMINOPHEN 650 MG RE SUPP: 650.0000 mg | Freq: Four times a day (QID) | RECTAL | Status: DC | PRN

## 2014-10-27 MED ORDER — CHLORHEXIDINE GLUCONATE 4 % EX LIQD: 60.0000 mL | Freq: Once | CUTANEOUS | Status: DC

## 2014-10-27 MED ORDER — ENOXAPARIN SODIUM 40 MG/0.4ML ~~LOC~~ SOLN
40.0000 mg | SUBCUTANEOUS | Status: DC
  Administered 2014-10-28: 40 mg via SUBCUTANEOUS
  Filled 2014-10-27 (×2): qty 0.4

## 2014-10-27 SURGICAL SUPPLY — 96 items
BANDAGE ELASTIC 4 VELCRO ST LF (GAUZE/BANDAGES/DRESSINGS) ×6 IMPLANT
BANDAGE ELASTIC 6 VELCRO ST LF (GAUZE/BANDAGES/DRESSINGS) ×3 IMPLANT
BANDAGE ESMARK 6X9 LF (GAUZE/BANDAGES/DRESSINGS) ×1 IMPLANT
BIT DRILL 3.8X6 NS (BIT) ×3 IMPLANT
BIT DRILL 4.4 NS (BIT) ×3 IMPLANT
BLADE SURG 10 STRL SS (BLADE) ×6 IMPLANT
BNDG COHESIVE 6X5 TAN STRL LF (GAUZE/BANDAGES/DRESSINGS) ×3 IMPLANT
BNDG ESMARK 6X9 LF (GAUZE/BANDAGES/DRESSINGS) ×3
BNDG GAUZE ELAST 4 BULKY (GAUZE/BANDAGES/DRESSINGS) ×3 IMPLANT
BONE CANC CHIPS 40CC CAN1/2 (Bone Implant) ×3 IMPLANT
BRUSH SCRUB DISP (MISCELLANEOUS) ×15 IMPLANT
CHIPS CANC BONE 40CC CAN1/2 (Bone Implant) ×1 IMPLANT
CLEANER TIP ELECTROSURG 2X2 (MISCELLANEOUS) ×3 IMPLANT
CLOSURE WOUND 1/2 X4 (GAUZE/BANDAGES/DRESSINGS) ×1
COVER SURGICAL LIGHT HANDLE (MISCELLANEOUS) ×6 IMPLANT
CUFF TOURNIQUET SINGLE 18IN (TOURNIQUET CUFF) IMPLANT
CUFF TOURNIQUET SINGLE 24IN (TOURNIQUET CUFF) IMPLANT
CUFF TOURNIQUET SINGLE 34IN LL (TOURNIQUET CUFF) IMPLANT
DRAPE C-ARM 42X72 X-RAY (DRAPES) ×3 IMPLANT
DRAPE C-ARMOR (DRAPES) ×3 IMPLANT
DRAPE EXTREMITY T 121X128X90 (DRAPE) ×6 IMPLANT
DRAPE IMP U-DRAPE 54X76 (DRAPES) ×3 IMPLANT
DRAPE INCISE IOBAN 66X45 STRL (DRAPES) IMPLANT
DRAPE OEC MINIVIEW 54X84 (DRAPES) ×3 IMPLANT
DRAPE U-SHAPE 47X51 STRL (DRAPES) ×3 IMPLANT
DRSG ADAPTIC 3X8 NADH LF (GAUZE/BANDAGES/DRESSINGS) ×3 IMPLANT
DRSG MEPILEX BORDER 4X8 (GAUZE/BANDAGES/DRESSINGS) ×3 IMPLANT
DRSG MEPITEL 4X7.2 (GAUZE/BANDAGES/DRESSINGS) ×3 IMPLANT
DRSG PAD ABDOMINAL 8X10 ST (GAUZE/BANDAGES/DRESSINGS) ×6 IMPLANT
ELECT REM PT RETURN 9FT ADLT (ELECTROSURGICAL) ×3
ELECTRODE REM PT RTRN 9FT ADLT (ELECTROSURGICAL) ×1 IMPLANT
EVACUATOR 1/8 PVC DRAIN (DRAIN) IMPLANT
GAUZE SPONGE 4X4 12PLY STRL (GAUZE/BANDAGES/DRESSINGS) ×3 IMPLANT
GLOVE BIO SURGEON STRL SZ7 (GLOVE) ×3 IMPLANT
GLOVE BIO SURGEON STRL SZ7.5 (GLOVE) ×9 IMPLANT
GLOVE BIO SURGEON STRL SZ8 (GLOVE) ×9 IMPLANT
GLOVE BIO SURGEON STRL SZ8.5 (GLOVE) ×3 IMPLANT
GLOVE BIOGEL PI IND STRL 7.5 (GLOVE) ×1 IMPLANT
GLOVE BIOGEL PI IND STRL 8 (GLOVE) ×1 IMPLANT
GLOVE BIOGEL PI INDICATOR 7.5 (GLOVE) ×2
GLOVE BIOGEL PI INDICATOR 8 (GLOVE) ×2
GLOVE XGUARD RR 2 7.5 (GLOVE) ×3 IMPLANT
GLOVE XGUARD RR2 7.5 (GLOVE) ×6
GOWN STRL REUS W/ TWL LRG LVL3 (GOWN DISPOSABLE) ×2 IMPLANT
GOWN STRL REUS W/ TWL XL LVL3 (GOWN DISPOSABLE) ×1 IMPLANT
GOWN STRL REUS W/TWL LRG LVL3 (GOWN DISPOSABLE) ×4
GOWN STRL REUS W/TWL XL LVL3 (GOWN DISPOSABLE) ×2
GUIDEWIRE BALL NOSE 80CM (WIRE) ×3 IMPLANT
KIT BASIN OR (CUSTOM PROCEDURE TRAY) ×3 IMPLANT
KIT ROOM TURNOVER OR (KITS) ×3 IMPLANT
MANIFOLD NEPTUNE II (INSTRUMENTS) ×3 IMPLANT
NAIL TIBIAL 11MM X 37.5CM (Nail) ×1 IMPLANT
NEEDLE 22X1 1/2 (OR ONLY) (NEEDLE) ×3 IMPLANT
NS IRRIG 1000ML POUR BTL (IV SOLUTION) ×3 IMPLANT
PACK GENERAL/GYN (CUSTOM PROCEDURE TRAY) IMPLANT
PACK ORTHO EXTREMITY (CUSTOM PROCEDURE TRAY) ×3 IMPLANT
PACK UNIVERSAL I (CUSTOM PROCEDURE TRAY) ×3 IMPLANT
PAD ARMBOARD 7.5X6 YLW CONV (MISCELLANEOUS) ×6 IMPLANT
PADDING CAST COTTON 6X4 STRL (CAST SUPPLIES) ×9 IMPLANT
SCREW ACE CAP BN (Screw) ×2 IMPLANT
SCREW ACECAP 40MM (Screw) ×3 IMPLANT
SCREW ACECAP 46MM (Screw) ×3 IMPLANT
SCREW BN OBLQ FT 54X4.5XST 2 (Screw) ×1 IMPLANT
SCREW LOCK PROX 5.5X65 151565 (Screw) ×3 IMPLANT
SCREW PROXIMAL DEPUY (Screw) ×2 IMPLANT
SCREW PRXML FT 50X5.5XLCK NS (Screw) ×1 IMPLANT
SPONGE GAUZE 4X4 12PLY STER LF (GAUZE/BANDAGES/DRESSINGS) ×3 IMPLANT
SPONGE LAP 18X18 X RAY DECT (DISPOSABLE) ×3 IMPLANT
SPONGE SCRUB IODOPHOR (GAUZE/BANDAGES/DRESSINGS) ×3 IMPLANT
SPONGE SURGIFOAM ABS GEL SZ50 (HEMOSTASIS) ×3 IMPLANT
STAPLER VISISTAT 35W (STAPLE) ×3 IMPLANT
STOCKINETTE IMPERVIOUS LG (DRAPES) ×3 IMPLANT
STRIP CLOSURE SKIN 1/2X4 (GAUZE/BANDAGES/DRESSINGS) ×2 IMPLANT
SUCTION FRAZIER TIP 10 FR DISP (SUCTIONS) ×3 IMPLANT
SUT ETHILON 2 0 PSLX (SUTURE) ×3 IMPLANT
SUT ETHILON 3 0 PS 1 (SUTURE) ×6 IMPLANT
SUT PDS AB 0 CT 36 (SUTURE) ×3 IMPLANT
SUT PDS AB 2-0 CT1 27 (SUTURE) ×6 IMPLANT
SUT VIC AB 0 CT1 27 (SUTURE)
SUT VIC AB 0 CT1 27XBRD ANBCTR (SUTURE) IMPLANT
SUT VIC AB 1 CT1 27 (SUTURE) ×2
SUT VIC AB 1 CT1 27XBRD ANBCTR (SUTURE) ×1 IMPLANT
SUT VIC AB 2-0 CT1 27 (SUTURE) ×2
SUT VIC AB 2-0 CT1 TAPERPNT 27 (SUTURE) ×1 IMPLANT
SUT VIC AB 2-0 CT3 27 (SUTURE) IMPLANT
SYR CONTROL 10ML LL (SYRINGE) ×3 IMPLANT
TIBIAL NAIL 11MM X 37.5CM (Nail) ×3 IMPLANT
TOWEL OR 17X24 6PK STRL BLUE (TOWEL DISPOSABLE) ×6 IMPLANT
TOWEL OR 17X26 10 PK STRL BLUE (TOWEL DISPOSABLE) ×6 IMPLANT
TRAY FOLEY CATH 16FRSI W/METER (SET/KITS/TRAYS/PACK) IMPLANT
TUBE CONNECTING 12'X1/4 (SUCTIONS) ×2
TUBE CONNECTING 12X1/4 (SUCTIONS) ×4 IMPLANT
TUBING CYSTO DISP (UROLOGICAL SUPPLIES) ×3 IMPLANT
UNDERPAD 30X30 INCONTINENT (UNDERPADS AND DIAPERS) ×3 IMPLANT
WATER STERILE IRR 1000ML POUR (IV SOLUTION) ×6 IMPLANT
YANKAUER SUCT BULB TIP NO VENT (SUCTIONS) ×3 IMPLANT

## 2014-10-27 NOTE — Anesthesia Procedure Notes (Signed)
Procedure Name: Intubation Date/Time: 10/27/2014 10:35 AM Performed by: Glo HerringLEE, Trevel Dillenbeck B Pre-anesthesia Checklist: Patient identified, Emergency Drugs available, Suction available, Patient being monitored and Timeout performed Patient Re-evaluated:Patient Re-evaluated prior to inductionOxygen Delivery Method: Circle system utilized Preoxygenation: Pre-oxygenation with 100% oxygen Intubation Type: IV induction Ventilation: Mask ventilation without difficulty Laryngoscope Size: Mac and 4 Grade View: Grade II Tube type: Oral Tube size: 7.5 mm Number of attempts: 1 Airway Equipment and Method: Stylet Placement Confirmation: breath sounds checked- equal and bilateral,  CO2 detector,  positive ETCO2 and ETT inserted through vocal cords under direct vision Secured at: 23 cm Tube secured with: Tape Dental Injury: Teeth and Oropharynx as per pre-operative assessment

## 2014-10-27 NOTE — Brief Op Note (Signed)
10/27/2014  2:21 PM  PATIENT:  Bruce Escobar  49 y.o. male  PRE-OPERATIVE DIAGNOSIS:   1. Unstable nonunion right tibia 2. Retained antibiotic nail  POST-OPERATIVE DIAGNOSIS:   1. Unstable nonunion right tibia 2. Retained antibiotic nail  PROCEDURE:  Procedure(s): 1. INTRAMEDULLARY (IM) NAIL RIGHT TIBIAL (Right) with Biomet Versanail 11x375, statically locked 2. OPEN REPAIR OF RIGHT TIBIA NONUNION  WITH ILIAC CREST AUTOGRAFTING 3. HARDWARE REMOVAL (Right) 4. STRESS FLOURO OF RIGHT TIBIA  SURGEON:  Surgeon(s) and Role:    * Myrene GalasMichael Kyrel Leighton, MD - Primary  PHYSICIAN ASSISTANT: Montez MoritaKeith Paul, PA-C  ANESTHESIA:   general  I/O:  Total I/O In: 2300 [I.V.:2300] Out: 200 [Blood:200]  SPECIMEN:  Source of Specimen:  REAMINGS, NONUNION SITE  DISPOSITION OF SPECIMEN:  To micro  TOURNIQUET:    DICTATION: .Other Dictation: Dictation Number [id number]

## 2014-10-27 NOTE — Progress Notes (Signed)
On call Montez MoritaKeith Paul states ok to administer PRNs to patient for addtl complaints of pain. States try to give POs instead of IV PRNs since pt is still on PCA.

## 2014-10-27 NOTE — Anesthesia Preprocedure Evaluation (Addendum)
Anesthesia Evaluation  Patient identified by MRN, date of birth, ID band Patient awake    Reviewed: Allergy & Precautions, H&P , NPO status , Patient's Chart, lab work & pertinent test results  Airway Mallampati: II  TM Distance: >3 FB Neck ROM: Full    Dental no notable dental hx. (+) Teeth Intact, Dental Advisory Given   Pulmonary neg pulmonary ROS,    Pulmonary exam normal breath sounds clear to auscultation       Cardiovascular negative cardio ROS   Rhythm:Regular Rate:Normal     Neuro/Psych negative neurological ROS  negative psych ROS   GI/Hepatic Neg liver ROS, GERD  Controlled,  Endo/Other  negative endocrine ROS  Renal/GU negative Renal ROS  negative genitourinary   Musculoskeletal  (+) Arthritis , Osteoarthritis,    Abdominal   Peds  Hematology negative hematology ROS (+)   Anesthesia Other Findings   Reproductive/Obstetrics negative OB ROS                            Anesthesia Physical Anesthesia Plan  ASA: II  Anesthesia Plan: General   Post-op Pain Management:    Induction: Intravenous  Airway Management Planned: Oral ETT  Additional Equipment:   Intra-op Plan:   Post-operative Plan: Extubation in OR  Informed Consent: I have reviewed the patients History and Physical, chart, labs and discussed the procedure including the risks, benefits and alternatives for the proposed anesthesia with the patient or authorized representative who has indicated his/her understanding and acceptance.   Dental advisory given  Plan Discussed with: CRNA  Anesthesia Plan Comments:         Anesthesia Quick Evaluation

## 2014-10-27 NOTE — Transfer of Care (Signed)
Immediate Anesthesia Transfer of Care Note  Patient: Bruce DykeJohn D Ellefson  Procedure(s) Performed: Procedure(s): INTRAMEDULLARY (IM) NAIL RIGHT TIBIAL (Right) HARDWARE REMOVAL (Right)  Patient Location: PACU  Anesthesia Type:General  Level of Consciousness: awake, alert  and oriented  Airway & Oxygen Therapy: Patient Spontanous Breathing and Patient connected to nasal cannula oxygen  Post-op Assessment: Report given to RN, Post -op Vital signs reviewed and stable and Patient moving all extremities X 4  Post vital signs: Reviewed and stable  Last Vitals:  Filed Vitals:   10/27/14 0830  BP: 133/91  Pulse: 64  Temp: 36.3 C  Resp: 18    Complications: No apparent anesthesia complications
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
## 2014-10-28 ENCOUNTER — Encounter (HOSPITAL_COMMUNITY): Payer: Self-pay | Admitting: Orthopedic Surgery

## 2014-10-28 DIAGNOSIS — F17200 Nicotine dependence, unspecified, uncomplicated: Secondary | ICD-10-CM

## 2014-10-28 DIAGNOSIS — S82261N Displaced segmental fracture of shaft of right tibia, subsequent encounter for open fracture type IIIA, IIIB, or IIIC with nonunion: Secondary | ICD-10-CM

## 2014-10-28 HISTORY — DX: Displaced segmental fracture of shaft of right tibia, subsequent encounter for open fracture type IIIA, IIIB, or IIIC with nonunion: S82.261N

## 2014-10-28 HISTORY — DX: Nicotine dependence, unspecified, uncomplicated: F17.200

## 2014-10-28 LAB — CBC
HCT: 25.6 % — ABNORMAL LOW (ref 39.0–52.0)
Hemoglobin: 8.2 g/dL — ABNORMAL LOW (ref 13.0–17.0)
MCH: 28.5 pg (ref 26.0–34.0)
MCHC: 32 g/dL (ref 30.0–36.0)
MCV: 88.9 fL (ref 78.0–100.0)
Platelets: 299 10*3/uL (ref 150–400)
RBC: 2.88 MIL/uL — ABNORMAL LOW (ref 4.22–5.81)
RDW: 13.5 % (ref 11.5–15.5)
WBC: 12 10*3/uL — ABNORMAL HIGH (ref 4.0–10.5)

## 2014-10-28 LAB — BASIC METABOLIC PANEL
Anion gap: 10 (ref 5–15)
BUN: 14 mg/dL (ref 6–20)
CALCIUM: 8.7 mg/dL — AB (ref 8.9–10.3)
CO2: 27 mmol/L (ref 22–32)
CREATININE: 1.53 mg/dL — AB (ref 0.61–1.24)
Chloride: 101 mmol/L (ref 101–111)
GFR calc non Af Amer: 52 mL/min — ABNORMAL LOW (ref >60)
Glucose, Bld: 120 mg/dL — ABNORMAL HIGH (ref 65–99)
Potassium: 4 mmol/L (ref 3.5–5.1)
SODIUM: 138 mmol/L (ref 135–145)

## 2014-10-28 LAB — VITAMIN D 25 HYDROXY (VIT D DEFICIENCY, FRACTURES): VIT D 25 HYDROXY: 47.2 ng/mL (ref 30.0–100.0)

## 2014-10-28 MED ORDER — VITAMIN D (ERGOCALCIFEROL) 1.25 MG (50000 UNIT) PO CAPS
50000.0000 [IU] | ORAL_CAPSULE | ORAL | Status: AC
Start: 2014-10-28 — End: ?

## 2014-10-28 MED ORDER — OXYCODONE HCL 5 MG PO TABS: 5.0000 mg | ORAL_TABLET | Freq: Four times a day (QID) | ORAL | Status: AC | PRN

## 2014-10-28 MED ORDER — CHOLECALCIFEROL 25 MCG (1000 UT) PO TABS: 2000.0000 [IU] | ORAL_TABLET | Freq: Two times a day (BID) | ORAL | Status: AC

## 2014-10-28 MED ORDER — OXYCODONE-ACETAMINOPHEN 5-325 MG PO TABS: 1.0000 | ORAL_TABLET | Freq: Four times a day (QID) | ORAL | Status: AC | PRN

## 2014-10-28 MED ORDER — METHOCARBAMOL 500 MG PO TABS: 500.0000 mg | ORAL_TABLET | Freq: Four times a day (QID) | ORAL | Status: AC | PRN

## 2014-10-28 MED ORDER — DOCUSATE SODIUM 100 MG PO CAPS: 100.0000 mg | ORAL_CAPSULE | Freq: Two times a day (BID) | ORAL | Status: AC

## 2014-10-28 NOTE — Progress Notes (Signed)
OT Note  Patient Details Name: Bruce DykeJohn D Barfoot MRN: [id number] DOB: 1965-08-30   Cancelled Treatment:    Reason Eval/Treat Not Completed: OT screened, no needs identified, will sign off -- per patient and PT.   Marit Goodwill A 10/28/2014, 9:57 AM

## 2014-10-28 NOTE — Progress Notes (Signed)
Utilization review completed.  

## 2014-10-28 NOTE — Progress Notes (Addendum)
Orthopaedic Trauma Service (OTS)  Subjective: 1 Day Post-Op Procedure(s) (LRB): INTRAMEDULLARY (IM) NAIL RIGHT TIBIAL (Right) HARDWARE REMOVAL (Right) Patient reports pain as moderate.   Unable to sleep with oxygen, PCA beeping.  Objective: Current Vitals Blood pressure 117/57, pulse 80, temperature 98.6 F (37 C), temperature source Oral, resp. rate 11, height 6' (1.829 m), weight 179 lb (81.194 kg), SpO2 100 %. Vital signs in last 24 hours: Temp:  [97.3 F (36.3 C)-98.6 F (37 C)] 98.6 F (37 C) (10/13 2100) Pulse Rate:  [52-80] 80 (10/13 2100) Resp:  [11-19] 11 (10/14 0803) BP: (107-126)/(56-78) 117/57 mmHg (10/13 2100) SpO2:  [93 %-100 %] 100 % (10/14 0803)  Intake/Output from previous day: 10/13 0701 - 10/14 0700 In: 3060 [P.O.:540; I.V.:2520] Out: 1600 [Urine:1400; Blood:200]  LABS  Recent Labs  10/27/14 0912 10/28/14 0440  HGB 10.5* 8.2*    Recent Labs  10/27/14 0912 10/28/14 0440  WBC 6.3 12.0*  RBC 3.68* 2.88*  HCT 32.2* 25.6*  PLT 272 299    Recent Labs  10/27/14 0912 10/28/14 0440  NA 141 138  K 4.3 4.0  CL 110 101  CO2 24 27  BUN 13 14  CREATININE 1.34* 1.53*  GLUCOSE 96 120*  CALCIUM 9.2 8.7*    Recent Labs  10/27/14 0912  INR 1.14    Physical Exam  R&LLE Dressing intact, serosanguinous drainage right tibia; scant drsg drainage on right hip  Edema/ swelling controlled  Sens: DPN, SPN, TN intact  Motor: EHL, FHL, and lessor toe ext and flex all intact grossly  Brisk cap refill, warm to touch  Imaging Dg Tibia/fibula Right  10/27/2014  CLINICAL DATA:  Intra medullary nail chain of tibial nonunion EXAM: DG C-ARM 61-120 MIN; RIGHT TIBIA AND FIBULA - 2 VIEW COMPARISON:  None. FINDINGS: Exchange of intra medullary nail within the RIGHT tibia. There are 2 proximal and 2 distal locking screws. IMPRESSION: Intra medullary nail exchange. Electronically Signed   By: Genevive BiStewart  Edmunds M.D.   On: 10/27/2014 14:16   Dg Tibia/fibula  Right Port  10/27/2014  CLINICAL DATA:  Status post ORIF of tibial fracture EXAM: PORTABLE RIGHT TIBIA AND FIBULA - 2 VIEW COMPARISON:  08/29/2014 FINDINGS: Large medullary rod is noted within the tibia. The smaller caliber rod is been removed in the interval. Proximal and distal fixation screws are noted. Nonunion of the previous fibular fractures is noted. IMPRESSION: Status post medullary rod placement. Electronically Signed   By: Alcide CleverMark  Lukens M.D.   On: 10/27/2014 15:13   Dg C-arm 1-60 Min  10/27/2014  CLINICAL DATA:  Intra medullary nail chain of tibial nonunion EXAM: DG C-ARM 61-120 MIN; RIGHT TIBIA AND FIBULA - 2 VIEW COMPARISON:  None. FINDINGS: Exchange of intra medullary nail within the RIGHT tibia. There are 2 proximal and 2 distal locking screws. IMPRESSION: Intra medullary nail exchange. Electronically Signed   By: Genevive BiStewart  Edmunds M.D.   On: 10/27/2014 14:16    Assessment/Plan: 1 Day Post-Op Procedure(s) (LRB): INTRAMEDULLARY (IM) NAIL RIGHT TIBIAL (Right) HARDWARE REMOVAL (Right)  D/c PCA and try to switch to PO's PT in progress D/c planning for tomorrow most likely but possibly late today given excellent performance with PT On Vanc until d/c'd when switch back to Doxycycline TDWB on RLE   Myrene GalasMichael Nguyen Todorov, MD [facility], [facility] [phone] (p)   10/28/2014, 8:52 AM  Changed dressing after PT Wound looks outstanding without ecchymosis, no active drainage  Myrene GalasMichael Treena Cosman, MD [facility], [facility] [phone] (  p)

## 2014-10-28 NOTE — Op Note (Signed)
NAMMarland Kitchen:  Rubye OaksWHITTEN, Barron                ACCOUNT NO.:  [id number]  MEDICAL RECORD NO.:  [id number]  LOCATION:  [id number]                        FACILITY:  [facility]  PHYSICIAN:  Doralee AlbinoMichael H. Carola FrostHandy, M.D. DATE OF BIRTH:  1965/05/16  DATE OF PROCEDURE:  10/27/2014 DATE OF DISCHARGE:                              OPERATIVE REPORT   PREOPERATIVE DIAGNOSES: 1. Nonunion of the right tibia. 2. Retained antibiotic nail. 3. Status post free flap placement to grade 3 open fracture     complicated by osteomyelitis.  POSTOPERATIVE DIAGNOSES: 1. Nonunion of the right tibia. 2. Retained antibiotic nail. 3. Status post free flap placement to grade 3 open fracture     complicated by osteomyelitis.  PROCEDURES: 1. Intramedullary nailing of the right tibia using a Biomet VersaNail,     11 x 375, statically locked. 2. Open repair of the right tibia, nonunion with iliac crest     autograft. 3. Removal of hardware, right tibia, antibiotic nail. 4. Stress fluoroscopy, tibial, nonunion.  SURGEON:  Doralee AlbinoMichael H. Carola FrostHandy, M.D.  ASSISTANT:  Mearl LatinKeith W Paul, PA-C.  ANESTHESIA:  General.  COMPLICATIONS:  None.  TOURNIQUET:  None.  I/O:  2300/200.  SPECIMENS:  Reamings and nonunion site both sent to Micro.  No bacteria seen.  DISPOSITION:  To PACU.  CONDITION:  Stable.  BRIEF SUMMARY AND INDICATION FOR PROCEDURE:  Bruce Escobar is a 49- year-old male, status post grade 3 open fracture, treated with IM nailing and complicated by late hematogenous infection and osteomyelitis.  He underwent multiple debridements and was transferred to our service because of the complexity of this problem with the initial treating surgeon, Dr. Renaye Rakersim Murphy believing it would be best managed by a fellowship trained orthopedic traumatologist.  We did perform the serial debridements, arrange for free flap placement and now returned to the OR for intramedullary nailing if there is demonstrated stability on stress fluoroscopy at the  fracture site or for possible combined open repair of the nonunion with iliac crest bone grafting.  I did discuss with the patient risks and benefits of surgery including the possibility of infection, nerve injury, vessel injury, DVT, PE, heart attack, stroke, and the flap necrosis among others.  I also [facility] the means of approach.  He did wish to proceed.  BRIEF SUMMARY OF PROCEDURE:  The patient was taken to the operating room where general anesthesia was induced.  His right lower extremity was prepped and draped in usual sterile fashion and exposed his ipsilateral iliac crest.  Began with the removal of the implant, which could have been done in isolation if the fracture demonstrated stability. Consequently, the incision was made at the knee and dissection was carried down to the lateral aspect of the parapatellar tendon where the incision was made because of the x-ray demonstrating it was more to the lateral side in semi-extended position.  My assistant helped by pulling traction of the patellar tendon medially, was able to hook the guidewire and elevate the soft tissues off the proximal aspect of the antibiotic nail and then withdrawn without difficulty.  Once this portion of the procedure was complete, I then stabilized  the tibia for stress fluoroscopy of the fracture site by my assistant.  Unfortunately, it was unstable.  At that point, we decided to proceed with open repair of the nonunion using iliac crest bone grafting and also for intramedullary placement of an antibiotic nail for long-term stabilization.  I made the anterior incision along the lateral aspect of the radial forearm free flap and elevated the flap very carefully off the bone defect, did not encounter any purulence, but certainly good bit of a fibrous tissue.  I placed a guidewire through the central portion of the tibia and sequentially reamed up to 11 mm  evacuating as much fibrinous material as possible.  The canal was irrigated thoroughly with 2000 mL of saline.  The curettes were used to completely explore the fracture site distally and removal of fibrinous material.  The patient then underwent reaming to 11.5 and 12 mm in preparation for eventual nail placement.  Montez Morita, held reduction at the tibia.  Attention was then turned proximally where fresh gloves and instruments were used to make a 3-cm incision over the iliac crest.  Dissection was carried down to the edge through the amuscular plane.  A curved cannulated awl was used to pop the top of the iliac crest while maintaining soft tissue attachments.  Serial gouges were used to remove large amounts of cancellous allograft, which was in excess of 20 mL. This graft was protected on the back table.  The wound was irrigated. Gelfoam was used and then a layered closure with the trapdoor being repair with a #1 Vicryl and then PDS and nylon.  Exparel was placed for pain control and around the periosteum and soft tissue incision.  Montez Morita assisted with collection of the graft and then closure while I had gone back down to the tibia.  The nonunion, which had been opened and debrided, underwent placement of the graft including the most posterior aspects of the fracture extension to make sure that all of this was grafted aggressively as possible before placement of the nail.  It was held in place with a Freer while the nail was gently advanced past the nonunion site and then proximal locks were placed through the static holes off the guide and then using perfect circle technique, two distal static locks.  Cancellous autograft was placed directly against the defect all the way from distally to proximally and some allograft for additional bolt was placed over top of this, so that there was a continuity along the bones entirety.  The soft tissue flap was then repaired back down using a  2-0 PDS and nylon with vertical mattress type of sutures.  The remaining wounds were irrigated and closed in standard fashion.  PROGNOSIS:  This operative repair should be successful if Mr. Wasser can avoid recurrent infection and maintain avoidance of all tobacco products, which I have reviewed with them on multiple occasions.  He will be nonweightbearing with unrestricted motion for the next 2 weeks and return for suture removal.  He remains on doxycycline and did receive IV vancomycin preoperatively for prophylaxis.  He is clearly at increased risk for recurrence of infection, persistent nonunion.     Doralee Albino. Carola Frost, M.D.     MHH/MEDQ  D:  10/27/2014  T:  10/28/2014  Job:  [id number]

## 2014-10-28 NOTE — Evaluation (Signed)
Physical Therapy Evaluation Patient Details Name: Bruce DykeJohn D Escobar MRN: [id number] DOB: 18-Apr-1965 Today's Date: 10/28/2014   History of Present Illness  Patient is a 49 y/o male admitted with right tibia nonunion s/p fracture sept 2015, partial excision, IM nail replacement, osteomyelitis and skin graft. 10/13 replaced nail with bone graft  Clinical Impression  Pt pleasant and moving well considering his year of limited weight bearing on RLE. Pt with help of ex-wife at home and has been managing well. Educated for gait, safety, exercise and need for seated bathing to prevent fall. Pt will benefit from acute therapy to maximize gait and function to return pt to mod I level.     Follow Up Recommendations No PT follow up    Equipment Recommendations  Other (comment) (shower seat)    Recommendations for Other Services       Precautions / Restrictions Precautions Precautions: Fall Precaution Comments: PRAFO for comfort in bed Restrictions RLE Weight Bearing: Touchdown weight bearing      Mobility  Bed Mobility Overal bed mobility: Modified Independent             General bed mobility comments: use of rails for return to bed due to painful right hip  Transfers Overall transfer level: Modified independent                  Ambulation/Gait Ambulation/Gait assistance: Supervision Ambulation Distance (Feet): 120 Feet Assistive device: Rolling walker (2 wheeled) Gait Pattern/deviations: Step-to pattern   Gait velocity interpretation: Below normal speed for age/gender General Gait Details: generally smooth and controlled gait with supervision for safety first time up  Stairs            Wheelchair Mobility    Modified Rankin (Stroke Patients Only)       Balance Overall balance assessment: Needs assistance;History of Falls   Sitting balance-Leahy Scale: Good       Standing balance-Leahy Scale: Fair                                Pertinent Vitals/Pain Pain Assessment: 0-10 Pain Score: 8  Pain Location: right hip Pain Descriptors / Indicators: Aching Pain Intervention(s): Premedicated before session;Repositioned;Monitored during session;Limited activity within patient's tolerance    Home Living Family/patient expects to be discharged to:: Private residence Living Arrangements: Other relatives (ex-wife) Available Help at Discharge: Friend(s);Available 24 hours/day Type of Home: House Home Access: Stairs to enter   Enter[employer] CorporationEntrance Stairs-Number of Steps: 2 Home Layout: One level Home Equipment: Cane - single point;Crutches;Walker - 2 wheels      Prior Function Level of Independence: Independent with assistive device(s)         Comments: ex-wife has been doing dressing changes and supervises for bathing as pt has been standing on one leg in the shower     Hand Dominance        Extremity/Trunk Assessment   Upper Extremity Assessment: Overall WFL for tasks assessed           Lower Extremity Assessment: Overall WFL for tasks assessed      Cervical / Trunk Assessment: Normal  Communication   Communication: No difficulties  Cognition Arousal/Alertness: Awake/alert Behavior During Therapy: WFL for tasks assessed/performed Overall Cognitive Status: Within Functional Limits for tasks assessed                      General Comments General comments (skin integrity, edema, etc.): Pt states  he has had one fall since ankle fx and surgeries    Exercises        Assessment/Plan    PT Assessment Patient needs continued PT services  PT Diagnosis Difficulty walking;Acute pain   PT Problem List Decreased activity tolerance;Decreased balance;Pain  PT Treatment Interventions Stair training;Gait training;DME instruction;Therapeutic exercise;Patient/family education   PT Goals (Current goals can be found in the Care Plan section) Acute Rehab PT Goals Patient Stated Goal: go home PT Goal  Formulation: With patient Time For Goal Achievement: 11/04/14 Potential to Achieve Goals: Good    Frequency Min 5X/week   Barriers to discharge        Co-evaluation               End of Session   Activity Tolerance: Patient tolerated treatment well Patient left: in bed;with call bell/phone within reach;with nursing/sitter in room Nurse Communication: Mobility status;Precautions;Weight bearing status         Time: 0832-0902 PT Time Calculation (min) (ACUTE ONLY): 30 min   Charges:   PT Evaluation $Initial PT Evaluation Tier I: 1 Procedure PT Treatments $Gait Training: 8-22 mins   PT G CodesDelorse Lek 10/28/2014, 10:11 AM Delaney Meigs, PT [phone]

## 2014-10-28 NOTE — Discharge Summary (Signed)
[facility] (OTS)  Patient ID: Bruce Escobar MRN: [id number] DOB/AGE: 1965-09-24 49 y.o.  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 Carola Frost 1. Intramedullary nailing of the right tibia using a Biomet VersaNail,     11 x 375, statically locked. 2. Open repair of the right tibia, nonunion with iliac crest     autograft. 3. Removal of hardware, right tibia, antibiotic nail. 4. Stress fluoroscopy, tibial, nonunion   Discharged Condition: good  Hospital Course:   A 49 year old male status post grade 3 open fracture treated with IM nail complicated by osteomyelitis. Treated with serial debridements including antibiotic IM nail 08/2013. Patient presented this admission for exchange nailing as well as grafting. Patient tolerated the procedure well. Transferred to the orthopedic floor for observation and pain control. His hospital stay was uncomplicated and only stayed overnight. On postop day #2 he was stable for discharge to home.  Consults: None  Significant Diagnostic Studies: None  Treatments: IV hydration, antibiotics: vancomycin, analgesia: Dilaudid and Percocet and OxyIR, anticoagulation: LMW heparin, therapies: PT, OT and RN and surgery: As above  Discharge Exam:  Expand All Collapse All   Subjective: 2 Days Post-Op Procedure(s) (LRB): INTRAMEDULLARY (IM) NAIL RIGHT TIBIAL (Right) HARDWARE REMOVAL (Right) Patient reports pain as 9 on 0-10 scale.   Objective: Vital signs in last  24 hours: Temp: [98.2 F (36.8 C)-99.1 F (37.3 C)] 98.4 F (36.9 C) (10/15 0601) Pulse Rate: [86-103] 90 (10/15 0601) Resp: [16-17] 17 (10/15 0601) BP: (114-123)/(58-71) 114/60 mmHg (10/15 0601) SpO2: [97 %-99 %] 99 % (10/15 0601)  Intake/Output from previous day: 10/14 0701 - 10/15 0700 In: 1438.3 [P.O.:600; I.V.:838.3] Out: 1800 [Urine:1800] Intake/Output this shift:     Recent Labs (last 2 labs)      Recent Labs  10/27/14 0912 10/28/14 0440  HGB 10.5* 8.2*      Recent Labs (last 2 labs)      Recent Labs  10/27/14 0912 10/28/14 0440  WBC 6.3 12.0*  RBC 3.68* 2.88*  HCT 32.2* 25.6*  PLT 272 299      Recent Labs (last 2 labs)      Recent Labs  10/28/14 0440 10/29/14 0606  NA 138 140  K 4.0 4.2  CL 101 105  CO2 27 27  BUN 14 10  CREATININE 1.53* 1.52*  GLUCOSE 120* 107*  CALCIUM 8.7* 8.9      Recent Labs (last 2 labs)      Recent Labs  10/27/14 0912  INR 1.14      ABD soft Neurovascular intact Sensation intact distally Incision: dressing C/D/I  Assessment/Plan: 2 Days Post-Op Procedure(s) (LRB): INTRAMEDULLARY (IM) NAIL RIGHT TIBIAL (Right) HARDWARE REMOVAL (Right)  Principal Problem:  Open displaced segmental fracture of shaft of right tibia, type IIIA, IIIB, or IIIC, with nonunion Active Problems:  Osteomyelitis of right lower extremity (HCC)  Osteomyelitis (HCC)  Infection with methicillin-resistant Staphylococcus aureus  Fracture, tibia, open  Nicotine dependence  Advance diet Up with therapy Discharge home with home health  Encouraged fluids for renal insufficiency. Can go home. Encouraged  to stop smoking  SHEPPERSON,KIRSTIN J 10/29/2014, 9:34 AM       Disposition: 01-Home or Self Care  Discharge Instructions    Call MD / Call 911    Complete by:  As directed   If you experience chest pain or shortness of breath, CALL 911 and be transported to the  hospital emergency room.  If you develope a fever above 101 F, pus (white drainage) or increased drainage or redness at the wound, or calf pain, call your surgeon's office.     Constipation Prevention    Complete by:  As directed   Drink plenty of fluids.  Prune juice may be helpful.  You may use a stool softener, such as Colace (over the counter) 100 mg twice a day.  Use MiraLax (over the counter) for constipation as needed.     Diet general    Complete by:  As directed      Discharge instructions    Complete by:  As directed   [facility] Discharge Instructions   General Discharge Instructions  WEIGHT BEARING STATUS: Touch down weightbearing R leg  RANGE OF MOTION/ACTIVITY: as tolerated   PAIN MEDICATION USE AND EXPECTATIONS  You have likely been given narcotic medications to help control your pain.  After a traumatic event that results in an fracture (broken bone) with or without surgery, it is ok to use narcotic pain medications to help control one's pain.  We understand that everyone responds to pain differently and each individual patient will be evaluated on a regular basis for the continued need for narcotic medications. Ideally, narcotic medication use should last no more than 6-8 weeks (coinciding with fracture healing).   As a patient it is your responsibility as well to monitor narcotic medication use and report the amount and frequency you use these medications when you come to your office visit.   We would also advise that if you are using narcotic medications, you should take a dose prior to therapy to maximize you participation.  IF YOU ARE ON NARCOTIC MEDICATIONS IT IS NOT PERMISSIBLE TO OPERATE A MOTOR VEHICLE (MOTORCYCLE/CAR/TRUCK/MOPED) OR HEAVY MACHINERY DO NOT MIX NARCOTICS WITH OTHER CNS (CENTRAL NERVOUS SYSTEM) DEPRESSANTS SUCH AS ALCOHOL  Diet: as you were eating previously.  Can use over the counter stool softeners and bowel preparations, such as  Miralax, to help with bowel movements.  Narcotics can be constipating.  Be sure to drink plenty of fluids  Wound Care: daily dressing changes, please see instructions below   STOP SMOKING OR USING NICOTINE PRODUCTS!!!!  As discussed nicotine severely impairs your body's ability to heal surgical and traumatic wounds but also impairs bone healing.  Wounds and bone heal by forming microscopic blood vessels (angiogenesis) and nicotine is a vasoconstrictor (essentially, shrinks blood vessels).  Therefore, if vasoconstriction occurs to these microscopic blood vessels they essentially disappear and are unable to deliver necessary nutrients to the healing tissue.  This is one modifiable factor that you can do to dramatically increase your chances of healing your injury.    (This means no smoking, no nicotine gum, patches, etc)  DO NOT USE NONSTEROIDAL ANTI-INFLAMMATORY DRUGS (NSAID'S)  Using products such as Advil (ibuprofen), Aleve (naproxen), Motrin (ibuprofen) for additional pain control during fracture healing can delay and/or prevent the healing response.  If you would like to take over the counter (OTC) medication, Tylenol (acetaminophen) is ok.  However, some narcotic medications that are given for pain control contain acetaminophen as well. Therefore, you  should not exceed more than 4000 mg of tylenol in a day if you do not have liver disease.  Also note that there are may OTC medicines, such as cold medicines and allergy medicines that my contain tylenol as well.  If you have any questions about medications and/or interactions please ask your doctor/PA or your pharmacist.      ICE AND ELEVATE INJURED/OPERATIVE EXTREMITY  Using ice and elevating the injured extremity above your heart can help with swelling and pain control.  Icing in a pulsatile fashion, such as 20 minutes on and 20 minutes off, can be followed.    Do not place ice directly on skin. Make sure there is a barrier between to skin and the  ice pack.    Using frozen items such as frozen peas works well as the conform nicely to the are that needs to be iced.  USE AN ACE WRAP OR TED HOSE FOR SWELLING CONTROL  In addition to icing and elevation, Ace wraps or TED hose are used to help limit and resolve swelling.  It is recommended to use Ace wraps or TED hose until you are informed to stop.    When using Ace Wraps start the wrapping distally (farthest away from the body) and wrap proximally (closer to the body)   Example: If you had surgery on your leg or thing and you do not have a splint on, start the ace wrap at the toes and work your way up to the thigh        If you had surgery on your upper extremity and do not have a splint on, start the ace wrap at your fingers and work your way up to the upper arm  IF YOU ARE IN A SPLINT OR CAST DO NOT REMOVE IT FOR ANY REASON   If your splint gets wet for any reason please contact the office immediately. You may shower in your splint or cast as long as you keep it dry.  This can be done by wrapping in a cast cover or garbage back (or similar)  Do Not stick any thing down your splint or cast such as pencils, money, or hangers to try and scratch yourself with.  If you feel itchy take benadryl as prescribed on the bottle for itching  IF YOU ARE IN A CAM BOOT (BLACK BOOT)  You may remove boot periodically. Perform daily dressing changes as noted below.  Wash the liner of the boot regularly and wear a sock when wearing the boot. It is recommended that you sleep in the boot until told otherwise  CALL THE OFFICE WITH ANY QUESTIONS OR CONCERTS: [phone]     Discharge Pin Site Instructions  Dress pins daily with Kerlix roll starting on POD 2. Wrap the Kerlix so that it tamps the skin down around the pin-skin interface to prevent/limit motion of the skin relative to the pin.  (Pin-skin motion is the primary cause of pain and infection related to external fixator pin sites).  Remove any crust or  coagulum that may obstruct drainage with a saline moistened gauze or soap and water.  After POD 3, if there is no discernable drainage on the pin site dressing, the interval for change can by increased to every other day.  You may shower with the fixator, cleaning all pin sites gently with soap and water.  If you have a surgical wound this needs to be completely dry and without drainage before showering.  The extremity can  be lifted by the fixator to facilitate wound care and transfers.  Notify the office/Doctor if you experience increasing drainage, redness, or pain from a pin site, or if you notice purulent (thick, snot-like) drainage.  Discharge Wound Care Instructions  Do NOT apply any ointments, solutions or lotions to pin sites or surgical wounds.  These prevent needed drainage and even though solutions like hydrogen peroxide kill bacteria, they also damage cells lining the pin sites that help fight infection.  Applying lotions or ointments can keep the wounds moist and can cause them to breakdown and open up as well. This can increase the risk for infection. When in doubt call the office.  Surgical incisions should be dressed daily.  If any drainage is noted, use one layer of adaptic, then gauze, Kerlix, and an ace wrap.  Once the incision is completely dry and without drainage, it may be left open to air out.  Showering may begin 36-48 hours later.  Cleaning gently with soap and water.  Traumatic wounds should be dressed daily as well.    One layer of adaptic, gauze, Kerlix, then ace wrap.  The adaptic can be discontinued once the draining has ceased    If you have a wet to dry dressing: wet the gauze with saline the squeeze as much saline out so the gauze is moist (not soaking wet), place moistened gauze over wound, then place a dry gauze over the moist one, followed by Kerlix wrap, then ace wrap.     Increase activity slowly as tolerated    Complete by:  As directed      Touch  down weight bearing    Complete by:  As directed             Medication List    TAKE these medications        acetaminophen 325 MG tablet  Commonly known as:  TYLENOL  Take 650 mg by mouth every 6 (six) hours as needed for headache.     CALCIUM PO  Take 1 tablet by mouth daily.     Cholecalciferol 1000 UNITS tablet  Take 2 tablets (2,000 Units total) by mouth 2 (two) times daily.     docusate sodium 100 MG capsule  Commonly known as:  COLACE  Take 1 capsule (100 mg total) by mouth 2 (two) times daily.     docusate sodium 100 MG capsule  Commonly known as:  COLACE  Take 1 capsule (100 mg total) by mouth 2 (two) times daily.     doxycycline 100 MG tablet  Commonly known as:  VIBRA-TABS  Take 1 tablet (100 mg total) by mouth 2 (two) times daily.     gabapentin 100 MG capsule  Commonly known as:  NEURONTIN  Take 1 capsule (100 mg total) by mouth at bedtime.     methocarbamol 500 MG tablet  Commonly known as:  ROBAXIN  Take 1-2 tablets (500-1,000 mg total) by mouth every 6 (six) hours as needed for muscle spasms.     oxyCODONE 5 MG immediate release tablet  Commonly known as:  Oxy IR/ROXICODONE  Take 1-2 tablets (5-10 mg total) by mouth every 6 (six) hours as needed for breakthrough pain (take between percocet doses).     oxyCODONE-acetaminophen 5-325 MG tablet  Commonly known as:  PERCOCET/ROXICET  Take 1-2 tablets by mouth every 6 (six) hours as needed for severe pain.     polyethylene glycol packet  Commonly known as:  MIRALAX / GLYCOLAX  Take  17 g by mouth daily.     vitamin C 500 MG tablet  Commonly known as:  ASCORBIC ACID  Take 500 mg by mouth daily.     Vitamin D (Ergocalciferol) 50000 UNITS Caps capsule  Commonly known as:  DRISDOL  Take 1 capsule (50,000 Units total) by mouth every 7 (seven) days.           Follow-up Information    Schedule an appointment as soon as possible for a visit with Budd Palmer, MD.   Specialty:  Orthopedic Surgery    Why:  For suture removal, For wound re-check   Contact information:   [address] [place] [address] [phone]       Discharge Instructions and Plan:  Touchdown weightbearing right leg Doxycycline for antibiosis Follow-up in 10-14 days at the office   Signed:  Mearl Latin, PA-C [facility] [phone] (P) 10/28/2014, 9:08 AM

## 2014-10-28 NOTE — Discharge Instructions (Signed)
Orthopaedic Trauma Service Discharge Instructions   General Discharge Instructions  WEIGHT BEARING STATUS: Touch down weightbearing R leg  RANGE OF MOTION/ACTIVITY: as tolerated   PAIN MEDICATION USE AND EXPECTATIONS  You have likely been given narcotic medications to help control your pain.  After a traumatic event that results in an fracture (broken bone) with or without surgery, it is ok to use narcotic pain medications to help control one's pain.  We understand that everyone responds to pain differently and each individual patient will be evaluated on a regular basis for the continued need for narcotic medications. Ideally, narcotic medication use should last no more than 6-8 weeks (coinciding with fracture healing).   As a patient it is your responsibility as well to monitor narcotic medication use and report the amount and frequency you use these medications when you come to your office visit.   We would also advise that if you are using narcotic medications, you should take a dose prior to therapy to maximize you participation.  IF YOU ARE ON NARCOTIC MEDICATIONS IT IS NOT PERMISSIBLE TO OPERATE A MOTOR VEHICLE (MOTORCYCLE/CAR/TRUCK/MOPED) OR HEAVY MACHINERY DO NOT MIX NARCOTICS WITH OTHER CNS (CENTRAL NERVOUS SYSTEM) DEPRESSANTS SUCH AS ALCOHOL  Diet: as you were eating previously.  Can use over the counter stool softeners and bowel preparations, such as Miralax, to help with bowel movements.  Narcotics can be constipating.  Be sure to drink plenty of fluids  Wound Care: daily dressing changes, please see instructions below   STOP SMOKING OR USING NICOTINE PRODUCTS!!!!  As discussed nicotine severely impairs your body's ability to heal surgical and traumatic wounds but also impairs bone healing.  Wounds and bone heal by forming microscopic blood vessels (angiogenesis) and nicotine is a vasoconstrictor (essentially, shrinks blood vessels).  Therefore, if vasoconstriction occurs to these  microscopic blood vessels they essentially disappear and are unable to deliver necessary nutrients to the healing tissue.  This is one modifiable factor that you can do to dramatically increase your chances of healing your injury.    (This means no smoking, no nicotine gum, patches, etc)  DO NOT USE NONSTEROIDAL ANTI-INFLAMMATORY DRUGS (NSAID'S)  Using products such as Advil (ibuprofen), Aleve (naproxen), Motrin (ibuprofen) for additional pain control during fracture healing can delay and/or prevent the healing response.  If you would like to take over the counter (OTC) medication, Tylenol (acetaminophen) is ok.  However, some narcotic medications that are given for pain control contain acetaminophen as well. Therefore, you should not exceed more than 4000 mg of tylenol in a day if you do not have liver disease.  Also note that there are may OTC medicines, such as cold medicines and allergy medicines that my contain tylenol as well.  If you have any questions about medications and/or interactions please ask your doctor/PA or your pharmacist.      ICE AND ELEVATE INJURED/OPERATIVE EXTREMITY  Using ice and elevating the injured extremity above your heart can help with swelling and pain control.  Icing in a pulsatile fashion, such as 20 minutes on and 20 minutes off, can be followed.    Do not place ice directly on skin. Make sure there is a barrier between to skin and the ice pack.    Using frozen items such as frozen peas works well as the conform nicely to the are that needs to be iced.  USE AN ACE WRAP OR TED HOSE FOR SWELLING CONTROL  In addition to icing and elevation, Ace wraps or TED  hose are used to help limit and resolve swelling.  It is recommended to use Ace wraps or TED hose until you are informed to stop.    When using Ace Wraps start the wrapping distally (farthest away from the body) and wrap proximally (closer to the body)   Example: If you had surgery on your leg or thing and you do not  have a splint on, start the ace wrap at the toes and work your way up to the thigh        If you had surgery on your upper extremity and do not have a splint on, start the ace wrap at your fingers and work your way up to the upper arm  IF YOU ARE IN A SPLINT OR CAST DO NOT REMOVE IT FOR ANY REASON   If your splint gets wet for any reason please contact the office immediately. You may shower in your splint or cast as long as you keep it dry.  This can be done by wrapping in a cast cover or garbage back (or similar)  Do Not stick any thing down your splint or cast such as pencils, money, or hangers to try and scratch yourself with.  If you feel itchy take benadryl as prescribed on the bottle for itching  IF YOU ARE IN A CAM BOOT (BLACK BOOT)  You may remove boot periodically. Perform daily dressing changes as noted below.  Wash the liner of the boot regularly and wear a sock when wearing the boot. It is recommended that you sleep in the boot until told otherwise  CALL THE OFFICE WITH ANY QUESTIONS OR CONCERTS: [phone]     Discharge Pin Site Instructions  Dress pins daily with Kerlix roll starting on POD 2. Wrap the Kerlix so that it tamps the skin down around the pin-skin interface to prevent/limit motion of the skin relative to the pin.  (Pin-skin motion is the primary cause of pain and infection related to external fixator pin sites).  Remove any crust or coagulum that may obstruct drainage with a saline moistened gauze or soap and water.  After POD 3, if there is no discernable drainage on the pin site dressing, the interval for change can by increased to every other day.  You may shower with the fixator, cleaning all pin sites gently with soap and water.  If you have a surgical wound this needs to be completely dry and without drainage before showering.  The extremity can be lifted by the fixator to facilitate wound care and transfers.  Notify the office/Doctor if you experience  increasing drainage, redness, or pain from a pin site, or if you notice purulent (thick, snot-like) drainage.  Discharge Wound Care Instructions  Do NOT apply any ointments, solutions or lotions to pin sites or surgical wounds.  These prevent needed drainage and even though solutions like hydrogen peroxide kill bacteria, they also damage cells lining the pin sites that help fight infection.  Applying lotions or ointments can keep the wounds moist and can cause them to breakdown and open up as well. This can increase the risk for infection. When in doubt call the office.  Surgical incisions should be dressed daily.  If any drainage is noted, use one layer of adaptic, then gauze, Kerlix, and an ace wrap.  Once the incision is completely dry and without drainage, it may be left open to air out.  Showering may begin 36-48 hours later.  Cleaning gently with soap and water.  Traumatic wounds should be dressed daily as well.    One layer of adaptic, gauze, Kerlix, then ace wrap.  The adaptic can be discontinued once the draining has ceased    If you have a wet to dry dressing: wet the gauze with saline the squeeze as much saline out so the gauze is moist (not soaking wet), place moistened gauze over wound, then place a dry gauze over the moist one, followed by Kerlix wrap, then ace wrap.

## 2014-10-28 NOTE — Care Management Note (Signed)
Case Management Note  Patient Details  Name: Bruce DykeJohn D Escobar MRN: [id number] Date of Birth: 01-19-1965  Subjective/Objective:        S/p IM nail right tibia, removal hardware, iliac crest autograft             Action/Plan: Home therapy not recommended by PT/OT, shower seat recommended. Spoke with patient, he stated that he did not want a shower seat, will let CM know if he changes his mind. Will continue to follow for d/c needs.   Expected Discharge Date:                  Expected Discharge Plan:  Home/Self Care  In-House Referral:  NA  Discharge planning Services  CM Consult  Post Acute Care Choice:  NA Choice offered to:     DME Arranged:    DME Agency:     HH Arranged:    HH Agency:     Status of Service:  In process, will continue to follow  Medicare Important Message Given:    Date Medicare IM Given:    Medicare IM give by:    Date Additional Medicare IM Given:    Additional Medicare Important Message give by:     If discussed at Long Length of Stay Meetings, dates discussed:    Additional Comments:  [name], [name], RN 10/28/2014, 10:25 AM

## 2014-10-28 NOTE — Anesthesia Postprocedure Evaluation (Signed)
  Anesthesia Post-op Note  Patient: Bruce DykeJohn D Cadogan  Procedure(s) Performed: Procedure(s): INTRAMEDULLARY (IM) NAIL RIGHT TIBIAL (Right) HARDWARE REMOVAL (Right)  Patient Location: PACU  Anesthesia Type:General  Level of Consciousness: awake and alert   Airway and Oxygen Therapy: Patient Spontanous Breathing  Post-op Pain: Controlled  Post-op Assessment: Post-op Vital signs reviewed, Patient's Cardiovascular Status Stable and Respiratory Function Stable  Post-op Vital Signs: Reviewed  Filed Vitals:   10/28/14 1500  BP: 123/71  Pulse: 103  Temp: 36.8 C  Resp: 16    Complications: No apparent anesthesia complications

## 2014-10-29 LAB — BASIC METABOLIC PANEL
ANION GAP: 8 (ref 5–15)
BUN: 10 mg/dL (ref 6–20)
CALCIUM: 8.9 mg/dL (ref 8.9–10.3)
CO2: 27 mmol/L (ref 22–32)
Chloride: 105 mmol/L (ref 101–111)
Creatinine, Ser: 1.52 mg/dL — ABNORMAL HIGH (ref 0.61–1.24)
GFR, EST NON AFRICAN AMERICAN: 53 mL/min — AB (ref >60)
Glucose, Bld: 107 mg/dL — ABNORMAL HIGH (ref 65–99)
Potassium: 4.2 mmol/L (ref 3.5–5.1)
SODIUM: 140 mmol/L (ref 135–145)

## 2014-10-29 NOTE — Progress Notes (Signed)
Subjective: 2 Days Post-Op Procedure(s) (LRB): INTRAMEDULLARY (IM) NAIL RIGHT TIBIAL (Right) HARDWARE REMOVAL (Right) Patient reports pain as 9 on 0-10 scale.    Objective: Vital signs in last 24 hours: Temp:  [98.2 F (36.8 C)-99.1 F (37.3 C)] 98.4 F (36.9 C) (10/15 0601) Pulse Rate:  [86-103] 90 (10/15 0601) Resp:  [16-17] 17 (10/15 0601) BP: (114-123)/(58-71) 114/60 mmHg (10/15 0601) SpO2:  [97 %-99 %] 99 % (10/15 0601)  Intake/Output from previous day: 10/14 0701 - 10/15 0700 In: 1438.3 [P.O.:600; I.V.:838.3] Out: 1800 [Urine:1800] Intake/Output this shift:     Recent Labs  10/27/14 0912 10/28/14 0440  HGB 10.5* 8.2*    Recent Labs  10/27/14 0912 10/28/14 0440  WBC 6.3 12.0*  RBC 3.68* 2.88*  HCT 32.2* 25.6*  PLT 272 299    Recent Labs  10/28/14 0440 10/29/14 0606  NA 138 140  K 4.0 4.2  CL 101 105  CO2 27 27  BUN 14 10  CREATININE 1.53* 1.52*  GLUCOSE 120* 107*  CALCIUM 8.7* 8.9    Recent Labs  10/27/14 0912  INR 1.14    ABD soft Neurovascular intact Sensation intact distally Incision: dressing C/D/I  Assessment/Plan: 2 Days Post-Op Procedure(s) (LRB): INTRAMEDULLARY (IM) NAIL RIGHT TIBIAL (Right) HARDWARE REMOVAL (Right)  Principal Problem:   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  Can go home.  Encouraged to stop smoking  Kaelie Henigan J 10/29/2014, 9:34 AM

## 2014-10-29 NOTE — Progress Notes (Signed)
Per Julien GirtKirstin Shepperson, PA - page doctor for dressing orders for skin graft site. Patient states Dr. Graylin ShiverHoward Levinson - operator provided #: [phone]. Voice mail for office - call [facility] resident-on-call @ [phone].

## 2014-10-29 NOTE — Progress Notes (Signed)
Dressing change to Rt anterior forearm - states is a donor graft site. Applied adaptic and covered/ wrapped with Kerlix per order - states they have been changing dressing daily at home for several weeks. Completed d/c teaching including reviewing AVS, prescriptions, timing for medications, dressing changes, and follow up appointments. Hand wrote on front page of AVS follow-up appointment with Dr. Genelle GatherLevinson @ [facility] with phone number - to call Monday and schedule. Friend will drive patient home - left via w/c with nursing tech to meet [place].

## 2014-10-29 NOTE — Progress Notes (Signed)
On hold while resident-on-call paged x 10 minutes. Operator stated resident should call - instructed to return call in 30 minutes for re-page if have not heard from MD.

## 2014-10-29 NOTE — Progress Notes (Signed)
Physical Therapy Treatment Patient Details Name: Amaryllis DykeJohn D Rivadeneira MRN: [id number] DOB: [birth year] Today's Date: 10/29/2014    History of Present Illness Patient is a 49 y/o male admitted with right tibia nonunion s/p fracture sept 2015, partial excision, IM nail replacement, osteomyelitis and skin graft. 10/13 replaced nail with bone graft    PT Comments    Gait distance limited by pain. Pt is mod I to supervision for all functional mobility. He is safe to return home, if pain is managed.  Follow Up Recommendations  No PT follow up     Equipment Recommendations  None recommended by PT    Recommendations for Other Services       Precautions / Restrictions Precautions Precautions: Fall Precaution Comments: PRAFO for comfort in bed Restrictions RLE Weight Bearing: Touchdown weight bearing    Mobility  Bed Mobility Overal bed mobility: Modified Independent             General bed mobility comments: use of rails for return to bed due to painful right hip  Transfers Overall transfer level: Modified independent Equipment used: None                Ambulation/Gait Ambulation/Gait assistance: Supervision Ambulation Distance (Feet): 10 Feet Assistive device: Rolling walker (2 wheeled) Gait Pattern/deviations: Step-to pattern;Antalgic Gait velocity: decreased Gait velocity interpretation: Below normal speed for age/gender General Gait Details: gait distance limited by pain   Stairs Stairs:  (Pt declining need for stair training. Reports he has been using crutches to enter/exit house for past year.)          Wheelchair Mobility    Modified Rankin (Stroke Patients Only)       Balance                                    Cognition Arousal/Alertness: Awake/alert Behavior During Therapy: WFL for tasks assessed/performed Overall Cognitive Status: Within Functional Limits for tasks assessed                      Exercises       General Comments        Pertinent Vitals/Pain Pain Assessment: 0-10 Pain Score: 9  Pain Location: right flank at bone graft site Pain Descriptors / Indicators: Aching;Constant Pain Intervention(s): Patient requesting pain meds-RN notified;Repositioned;Limited activity within patient's tolerance    Home Living                      Prior Function            PT Goals (current goals can now be found in the care plan section) Acute Rehab PT Goals Patient Stated Goal: go home PT Goal Formulation: With patient Time For Goal Achievement: 11/04/14 Potential to Achieve Goals: Good Progress towards PT goals: Progressing toward goals    Frequency  Min 5X/week    PT Plan Current plan remains appropriate    Co-evaluation             End of Session   Activity Tolerance: Patient limited by pain Patient left: in bed;with call bell/phone within reach     Time: 0922-0935 PT Time Calculation (min) (ACUTE ONLY): 13 min  Charges:  $Gait Training: 8-22 mins                    G Codes:      Ilda FoilGarrow, Chantry Headen Rene 10/29/2014,  10:24 AM

## 2014-10-29 NOTE — Progress Notes (Signed)
Called cell phone after his d/c - around 1630. Left a voice mail message stating he had forgotten his orthotic walking boot in the room. Will hold for pick-up and to call the floor @ [phone] if he desires.

## 2014-10-31 LAB — VITAMIN D 1,25 DIHYDROXY: Vitamin D 1, 25 (OH)2 Total: <10 pg/mL

## 2014-10-31 LAB — TISSUE CULTURE: CULTURE: NO GROWTH

## 2014-11-01 LAB — ANAEROBIC CULTURE

## 2014-12-02 ENCOUNTER — Encounter: Payer: Self-pay | Admitting: Infectious Disease

## 2014-12-06 ENCOUNTER — Encounter: Payer: Self-pay | Admitting: Infectious Disease

## 2015-01-18 ENCOUNTER — Encounter: Payer: Self-pay | Admitting: Infectious Disease

## 2018-09-16 ENCOUNTER — Other Ambulatory Visit: Payer: Self-pay | Admitting: Physician Assistant

## 2018-09-16 DIAGNOSIS — S62022A Displaced fracture of middle third of navicular [scaphoid] bone of left wrist, initial encounter for closed fracture: Secondary | ICD-10-CM

## 2018-09-23 ENCOUNTER — Other Ambulatory Visit: Payer: Self-pay | Admitting: Physician Assistant

## 2018-09-23 DIAGNOSIS — S62022A Displaced fracture of middle third of navicular [scaphoid] bone of left wrist, initial encounter for closed fracture: Secondary | ICD-10-CM

## 2018-09-24 ENCOUNTER — Other Ambulatory Visit: Payer: Self-pay

## 2018-09-24 ENCOUNTER — Ambulatory Visit
Admission: RE | Admit: 2018-09-24 | Discharge: 2018-09-24 | Disposition: A | Discharge status: Home / Self Care | Payer: 59 | Source: Ambulatory Visit | Attending: Physician Assistant | Admitting: Physician Assistant

## 2018-09-24 DIAGNOSIS — S62022A Displaced fracture of middle third of navicular [scaphoid] bone of left wrist, initial encounter for closed fracture: Secondary | ICD-10-CM

## 2019-10-13 ENCOUNTER — Inpatient Hospital Stay (HOSPITAL_COMMUNITY)
Admission: EM | Admit: 2019-10-13 | Discharge: 2019-11-15 | DRG: 004 | Disposition: E | Payer: Medicaid Other | Attending: Internal Medicine | Admitting: Internal Medicine

## 2019-10-13 ENCOUNTER — Encounter (HOSPITAL_COMMUNITY): Payer: Self-pay | Admitting: Emergency Medicine

## 2019-10-13 ENCOUNTER — Emergency Department (HOSPITAL_COMMUNITY): Payer: Medicaid Other

## 2019-10-13 ENCOUNTER — Other Ambulatory Visit: Payer: Self-pay

## 2019-10-13 DIAGNOSIS — J95851 Ventilator associated pneumonia: Secondary | ICD-10-CM | POA: Diagnosis not present

## 2019-10-13 DIAGNOSIS — J80 Acute respiratory distress syndrome: Secondary | ICD-10-CM

## 2019-10-13 DIAGNOSIS — Z79899 Other long term (current) drug therapy: Secondary | ICD-10-CM

## 2019-10-13 DIAGNOSIS — R578 Other shock: Secondary | ICD-10-CM | POA: Diagnosis not present

## 2019-10-13 DIAGNOSIS — Z5309 Procedure and treatment not carried out because of other contraindication: Secondary | ICD-10-CM | POA: Diagnosis not present

## 2019-10-13 DIAGNOSIS — D696 Thrombocytopenia, unspecified: Secondary | ICD-10-CM | POA: Diagnosis not present

## 2019-10-13 DIAGNOSIS — Z720 Tobacco use: Secondary | ICD-10-CM

## 2019-10-13 DIAGNOSIS — L89156 Pressure-induced deep tissue damage of sacral region: Secondary | ICD-10-CM | POA: Diagnosis not present

## 2019-10-13 DIAGNOSIS — Z888 Allergy status to other drugs, medicaments and biological substances status: Secondary | ICD-10-CM

## 2019-10-13 DIAGNOSIS — F1120 Opioid dependence, uncomplicated: Secondary | ICD-10-CM | POA: Diagnosis present

## 2019-10-13 DIAGNOSIS — E875 Hyperkalemia: Secondary | ICD-10-CM | POA: Diagnosis not present

## 2019-10-13 DIAGNOSIS — Z515 Encounter for palliative care: Secondary | ICD-10-CM

## 2019-10-13 DIAGNOSIS — E876 Hypokalemia: Secondary | ICD-10-CM | POA: Diagnosis present

## 2019-10-13 DIAGNOSIS — E1151 Type 2 diabetes mellitus with diabetic peripheral angiopathy without gangrene: Secondary | ICD-10-CM | POA: Diagnosis not present

## 2019-10-13 DIAGNOSIS — I480 Paroxysmal atrial fibrillation: Secondary | ICD-10-CM | POA: Diagnosis not present

## 2019-10-13 DIAGNOSIS — E870 Hyperosmolality and hypernatremia: Secondary | ICD-10-CM | POA: Diagnosis not present

## 2019-10-13 DIAGNOSIS — Z66 Do not resuscitate: Secondary | ICD-10-CM | POA: Diagnosis not present

## 2019-10-13 DIAGNOSIS — J1282 Pneumonia due to coronavirus disease 2019: Secondary | ICD-10-CM | POA: Diagnosis present

## 2019-10-13 DIAGNOSIS — U071 COVID-19: Secondary | ICD-10-CM

## 2019-10-13 DIAGNOSIS — K219 Gastro-esophageal reflux disease without esophagitis: Secondary | ICD-10-CM | POA: Diagnosis present

## 2019-10-13 DIAGNOSIS — G928 Other toxic encephalopathy: Secondary | ICD-10-CM | POA: Diagnosis not present

## 2019-10-13 DIAGNOSIS — I82B11 Acute embolism and thrombosis of right subclavian vein: Secondary | ICD-10-CM | POA: Diagnosis not present

## 2019-10-13 DIAGNOSIS — S90822A Blister (nonthermal), left foot, initial encounter: Secondary | ICD-10-CM | POA: Diagnosis present

## 2019-10-13 DIAGNOSIS — Z886 Allergy status to analgesic agent status: Secondary | ICD-10-CM

## 2019-10-13 DIAGNOSIS — Y95 Nosocomial condition: Secondary | ICD-10-CM | POA: Diagnosis not present

## 2019-10-13 DIAGNOSIS — R6521 Severe sepsis with septic shock: Secondary | ICD-10-CM | POA: Diagnosis present

## 2019-10-13 DIAGNOSIS — R0603 Acute respiratory distress: Secondary | ICD-10-CM

## 2019-10-13 DIAGNOSIS — T17490A Other foreign object in trachea causing asphyxiation, initial encounter: Secondary | ICD-10-CM | POA: Diagnosis not present

## 2019-10-13 DIAGNOSIS — Y848 Other medical procedures as the cause of abnormal reaction of the patient, or of later complication, without mention of misadventure at the time of the procedure: Secondary | ICD-10-CM | POA: Diagnosis not present

## 2019-10-13 DIAGNOSIS — I82621 Acute embolism and thrombosis of deep veins of right upper extremity: Secondary | ICD-10-CM | POA: Diagnosis not present

## 2019-10-13 DIAGNOSIS — R0602 Shortness of breath: Secondary | ICD-10-CM

## 2019-10-13 DIAGNOSIS — Z6829 Body mass index (BMI) 29.0-29.9, adult: Secondary | ICD-10-CM

## 2019-10-13 DIAGNOSIS — R040 Epistaxis: Secondary | ICD-10-CM | POA: Diagnosis not present

## 2019-10-13 DIAGNOSIS — J189 Pneumonia, unspecified organism: Secondary | ICD-10-CM

## 2019-10-13 DIAGNOSIS — E8770 Fluid overload, unspecified: Secondary | ICD-10-CM | POA: Diagnosis not present

## 2019-10-13 DIAGNOSIS — I248 Other forms of acute ischemic heart disease: Secondary | ICD-10-CM | POA: Diagnosis not present

## 2019-10-13 DIAGNOSIS — E874 Mixed disorder of acid-base balance: Secondary | ICD-10-CM | POA: Diagnosis present

## 2019-10-13 DIAGNOSIS — I82890 Acute embolism and thrombosis of other specified veins: Secondary | ICD-10-CM | POA: Diagnosis not present

## 2019-10-13 DIAGNOSIS — M1990 Unspecified osteoarthritis, unspecified site: Secondary | ICD-10-CM | POA: Diagnosis present

## 2019-10-13 DIAGNOSIS — Z930 Tracheostomy status: Secondary | ICD-10-CM

## 2019-10-13 DIAGNOSIS — L8990 Pressure ulcer of unspecified site, unspecified stage: Secondary | ICD-10-CM | POA: Insufficient documentation

## 2019-10-13 DIAGNOSIS — I82431 Acute embolism and thrombosis of right popliteal vein: Secondary | ICD-10-CM | POA: Diagnosis not present

## 2019-10-13 DIAGNOSIS — A4189 Other specified sepsis: Principal | ICD-10-CM | POA: Diagnosis present

## 2019-10-13 DIAGNOSIS — R7989 Other specified abnormal findings of blood chemistry: Secondary | ICD-10-CM | POA: Diagnosis not present

## 2019-10-13 DIAGNOSIS — D6489 Other specified anemias: Secondary | ICD-10-CM | POA: Diagnosis not present

## 2019-10-13 DIAGNOSIS — K7010 Alcoholic hepatitis without ascites: Secondary | ICD-10-CM | POA: Diagnosis present

## 2019-10-13 DIAGNOSIS — S90821A Blister (nonthermal), right foot, initial encounter: Secondary | ICD-10-CM | POA: Diagnosis present

## 2019-10-13 DIAGNOSIS — E1165 Type 2 diabetes mellitus with hyperglycemia: Secondary | ICD-10-CM | POA: Diagnosis not present

## 2019-10-13 DIAGNOSIS — R509 Fever, unspecified: Secondary | ICD-10-CM | POA: Diagnosis not present

## 2019-10-13 DIAGNOSIS — E11621 Type 2 diabetes mellitus with foot ulcer: Secondary | ICD-10-CM | POA: Diagnosis not present

## 2019-10-13 DIAGNOSIS — R451 Restlessness and agitation: Secondary | ICD-10-CM | POA: Diagnosis not present

## 2019-10-13 DIAGNOSIS — R197 Diarrhea, unspecified: Secondary | ICD-10-CM | POA: Diagnosis not present

## 2019-10-13 DIAGNOSIS — Z4659 Encounter for fitting and adjustment of other gastrointestinal appliance and device: Secondary | ICD-10-CM

## 2019-10-13 DIAGNOSIS — Z781 Physical restraint status: Secondary | ICD-10-CM

## 2019-10-13 DIAGNOSIS — F1010 Alcohol abuse, uncomplicated: Secondary | ICD-10-CM | POA: Diagnosis present

## 2019-10-13 DIAGNOSIS — J9600 Acute respiratory failure, unspecified whether with hypoxia or hypercapnia: Secondary | ICD-10-CM

## 2019-10-13 DIAGNOSIS — R319 Hematuria, unspecified: Secondary | ICD-10-CM | POA: Diagnosis not present

## 2019-10-13 DIAGNOSIS — M62838 Other muscle spasm: Secondary | ICD-10-CM | POA: Diagnosis present

## 2019-10-13 DIAGNOSIS — Z880 Allergy status to penicillin: Secondary | ICD-10-CM

## 2019-10-13 DIAGNOSIS — E11649 Type 2 diabetes mellitus with hypoglycemia without coma: Secondary | ICD-10-CM | POA: Diagnosis not present

## 2019-10-13 DIAGNOSIS — Z978 Presence of other specified devices: Secondary | ICD-10-CM

## 2019-10-13 DIAGNOSIS — F1510 Other stimulant abuse, uncomplicated: Secondary | ICD-10-CM | POA: Diagnosis present

## 2019-10-13 DIAGNOSIS — J9501 Hemorrhage from tracheostomy stoma: Secondary | ICD-10-CM | POA: Diagnosis not present

## 2019-10-13 DIAGNOSIS — N170 Acute kidney failure with tubular necrosis: Secondary | ICD-10-CM | POA: Diagnosis present

## 2019-10-13 DIAGNOSIS — J9601 Acute respiratory failure with hypoxia: Secondary | ICD-10-CM

## 2019-10-13 DIAGNOSIS — Z01818 Encounter for other preprocedural examination: Secondary | ICD-10-CM

## 2019-10-13 DIAGNOSIS — Z9289 Personal history of other medical treatment: Secondary | ICD-10-CM

## 2019-10-13 DIAGNOSIS — E43 Unspecified severe protein-calorie malnutrition: Secondary | ICD-10-CM | POA: Diagnosis present

## 2019-10-13 DIAGNOSIS — R778 Other specified abnormalities of plasma proteins: Secondary | ICD-10-CM | POA: Diagnosis not present

## 2019-10-13 DIAGNOSIS — K5900 Constipation, unspecified: Secondary | ICD-10-CM | POA: Diagnosis not present

## 2019-10-13 LAB — CBC WITH DIFFERENTIAL/PLATELET
Abs Immature Granulocytes: 0.18 10*3/uL — ABNORMAL HIGH (ref 0.00–0.07)
Basophils Absolute: 0.1 10*3/uL (ref 0.0–0.1)
Basophils Relative: 0 %
Eosinophils Absolute: 0 10*3/uL (ref 0.0–0.5)
Eosinophils Relative: 0 %
HCT: 43.6 % (ref 39.0–52.0)
Hemoglobin: 14.4 g/dL (ref 13.0–17.0)
Immature Granulocytes: 1 %
Lymphocytes Relative: 4 %
Lymphs Abs: 0.8 10*3/uL (ref 0.7–4.0)
MCH: 31.6 pg (ref 26.0–34.0)
MCHC: 33 g/dL (ref 30.0–36.0)
MCV: 95.6 fL (ref 80.0–100.0)
Monocytes Absolute: 1.7 10*3/uL — ABNORMAL HIGH (ref 0.1–1.0)
Monocytes Relative: 9 %
Neutro Abs: 16.4 10*3/uL — ABNORMAL HIGH (ref 1.7–7.7)
Neutrophils Relative %: 86 %
Platelets: 357 10*3/uL (ref 150–400)
RBC: 4.56 MIL/uL (ref 4.22–5.81)
RDW: 13.4 % (ref 11.5–15.5)
WBC: 19.1 10*3/uL — ABNORMAL HIGH (ref 4.0–10.5)
nRBC: 0 % (ref 0.0–0.2)

## 2019-10-13 LAB — COMPREHENSIVE METABOLIC PANEL
ALT: 33 U/L (ref 0–44)
AST: 34 U/L (ref 15–41)
Albumin: 1.7 g/dL — ABNORMAL LOW (ref 3.5–5.0)
Alkaline Phosphatase: 57 U/L (ref 38–126)
Anion gap: 10 (ref 5–15)
BUN: 20 mg/dL (ref 6–20)
CO2: 14 mmol/L — ABNORMAL LOW (ref 22–32)
Calcium: 5.8 mg/dL — CL (ref 8.9–10.3)
Chloride: 114 mmol/L — ABNORMAL HIGH (ref 98–111)
Creatinine, Ser: 1.53 mg/dL — ABNORMAL HIGH (ref 0.61–1.24)
GFR calc Af Amer: 59 mL/min — ABNORMAL LOW (ref >60)
GFR calc non Af Amer: 51 mL/min — ABNORMAL LOW (ref >60)
Glucose, Bld: 133 mg/dL — ABNORMAL HIGH (ref 70–99)
Potassium: 3.4 mmol/L — ABNORMAL LOW (ref 3.5–5.1)
Sodium: 138 mmol/L (ref 135–145)
Total Bilirubin: 1.8 mg/dL — ABNORMAL HIGH (ref 0.3–1.2)
Total Protein: 5 g/dL — ABNORMAL LOW (ref 6.5–8.1)

## 2019-10-13 LAB — LACTIC ACID, PLASMA
Lactic Acid, Venous: 4.2 mmol/L (ref 0.5–1.9)
Lactic Acid, Venous: 4.3 mmol/L (ref 0.5–1.9)

## 2019-10-13 LAB — FERRITIN: Ferritin: 1236 ng/mL — ABNORMAL HIGH (ref 24–336)

## 2019-10-13 LAB — LACTATE DEHYDROGENASE: LDH: 413 U/L — ABNORMAL HIGH (ref 98–192)

## 2019-10-13 LAB — TROPONIN I (HIGH SENSITIVITY)
Troponin I (High Sensitivity): 341 ng/L (ref <18)
Troponin I (High Sensitivity): 402 ng/L (ref <18)

## 2019-10-13 LAB — D-DIMER, QUANTITATIVE: D-Dimer, Quant: >20 ug/mL-FEU — ABNORMAL HIGH (ref 0.00–0.50)

## 2019-10-13 LAB — PROCALCITONIN: Procalcitonin: 0.72 ng/mL

## 2019-10-13 MED ORDER — KETAMINE HCL 10 MG/ML IJ SOLN
INTRAMUSCULAR | Status: AC
  Administered 2019-10-13: 24 mg via INTRAVENOUS
  Filled 2019-10-13: qty 1

## 2019-10-13 MED ORDER — CALCIUM GLUCONATE-NACL 1-0.675 GM/50ML-% IV SOLN
1.0000 g | Freq: Once | INTRAVENOUS | Status: AC
  Administered 2019-10-13: 1000 mg via INTRAVENOUS
  Filled 2019-10-13: qty 50

## 2019-10-13 MED ORDER — FENTANYL 2500MCG IN NS 250ML (10MCG/ML) PREMIX INFUSION
0.0000 ug/h | INTRAVENOUS | Status: DC
  Administered 2019-10-13: 50 ug/h via INTRAVENOUS

## 2019-10-13 MED ORDER — SODIUM CHLORIDE 0.9 % IV SOLN: INTRAVENOUS | Status: DC

## 2019-10-13 MED ORDER — ETOMIDATE 2 MG/ML IV SOLN
20.0000 mg | Freq: Once | INTRAVENOUS | Status: AC
  Administered 2019-10-13: 20 mg via INTRAVENOUS

## 2019-10-13 MED ORDER — LORAZEPAM 2 MG/ML IJ SOLN
1.0000 mg | Freq: Once | INTRAMUSCULAR | Status: AC
  Administered 2019-10-13: 1 mg via INTRAVENOUS
  Filled 2019-10-13: qty 1

## 2019-10-13 MED ORDER — SODIUM CHLORIDE 0.9 % IV SOLN
1.0000 g | Freq: Once | INTRAVENOUS | Status: AC
  Administered 2019-10-13: 1 g via INTRAVENOUS
  Filled 2019-10-13: qty 10

## 2019-10-13 MED ORDER — KETAMINE HCL 10 MG/ML IJ SOLN: 0.3000 mg/kg | Freq: Once | INTRAMUSCULAR | Status: AC

## 2019-10-13 MED ORDER — SODIUM CHLORIDE 0.9 % IV SOLN
100.0000 mg | Freq: Every day | INTRAVENOUS | Status: DC
  Administered 2019-10-13 – 2019-10-14 (×2): 100 mg via INTRAVENOUS
  Filled 2019-10-13 (×2): qty 20

## 2019-10-13 MED ORDER — SUCCINYLCHOLINE CHLORIDE 20 MG/ML IJ SOLN
150.0000 mg | Freq: Once | INTRAMUSCULAR | Status: AC
  Administered 2019-10-13: 150 mg via INTRAVENOUS

## 2019-10-13 MED ORDER — LORAZEPAM 2 MG/ML IJ SOLN
0.5000 mg/h | INTRAVENOUS | Status: DC
  Filled 2019-10-13 (×2): qty 25

## 2019-10-13 MED ORDER — SODIUM CHLORIDE 0.9 % IV BOLUS
1000.0000 mL | Freq: Once | INTRAVENOUS | Status: AC
  Administered 2019-10-13: 1000 mL via INTRAVENOUS

## 2019-10-13 MED ORDER — DEXAMETHASONE SODIUM PHOSPHATE 10 MG/ML IJ SOLN
10.0000 mg | Freq: Once | INTRAMUSCULAR | Status: AC
  Administered 2019-10-13: 10 mg via INTRAVENOUS
  Filled 2019-10-13: qty 1

## 2019-10-13 MED ORDER — ASPIRIN 81 MG PO CHEW
324.0000 mg | CHEWABLE_TABLET | Freq: Once | ORAL | Status: AC
  Administered 2019-10-13: 324 mg via ORAL
  Filled 2019-10-13: qty 4

## 2019-10-13 MED ORDER — SODIUM CHLORIDE 0.9 % IV BOLUS
500.0000 mL | Freq: Once | INTRAVENOUS | Status: AC
  Administered 2019-10-13: 500 mL via INTRAVENOUS

## 2019-10-13 MED ORDER — SODIUM CHLORIDE 0.9 % IV SOLN
500.0000 mg | Freq: Once | INTRAVENOUS | Status: AC
  Administered 2019-10-13: 500 mg via INTRAVENOUS
  Filled 2019-10-13: qty 500
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
## 2019-10-13 NOTE — ED Notes (Signed)
Date and time results received: 09/30/2019 9:14 PM(use smartphrase ".now" to insert current time)  Test: calcium Critical Value: 5.8  Name of Provider Notified: Dr Effie Shy  Orders Received? Or Actions Taken?: see chart

## 2019-10-13 NOTE — ED Notes (Signed)
Date and time results received: 10/11/2019 2138 (use smartphrase ".now" to insert current time)  Test: Lactic Acid Critical Value: 4.2  Name of Provider Notified: Effie Shy, MD  Orders Received? Or Actions Taken?:

## 2019-10-13 NOTE — ED Triage Notes (Signed)
Pt COVID positive, recently admitted at [facility] for COVID and discharged home. States hasn't improved since discharge.

## 2019-10-13 NOTE — ED Notes (Signed)
Date and time results received: 11-11-19 2230 (use smartphrase ".now" to insert current time)  Test: Lactic acid Critical Value: 4.3  Name of Provider Notified: Dr Lynelle Doctor  Orders Received? Or Actions Taken?: Actions Taken: no orders received.

## 2019-10-13 NOTE — Progress Notes (Signed)
Dr. Effie Shy and I discussed this patient by phone. Patient has covid with superimposed bacterial infection. O2 sats couldn't be controlled at presentation, so patient was intubated. Patient also had some low pressures as low as 60s/50s. Dr. Effie Shy reports that since intubation, pressures have stabilized. O2 sat did drop below 85% again. The plan is to suction, and monitor for improvement. Critical care at [facility] has reported that there are no beds available. If patient's O2 sats and pressures are stable here, we will take him to our ICU.

## 2019-10-13 NOTE — ED Notes (Signed)
Date and time results received: 10/04/2019 10:39 PM(use smartphrase ".now" to insert current time)  Test: troponin Critical Value: 402  Name of Provider Notified: Dr Effie Shy  Orders Received? Or Actions Taken?: see chart

## 2019-10-13 NOTE — ED Notes (Signed)
Date and time results received: 2019-11-06 9:15 PM(use smartphrase ".now" to insert current time)  Test: troponin Critical Value: 341  Name of Provider Notified: Dr Effie Shy  Orders Received? Or Actions Taken?: see chart

## 2019-10-14 ENCOUNTER — Inpatient Hospital Stay (HOSPITAL_COMMUNITY): Payer: Medicaid Other

## 2019-10-14 DIAGNOSIS — Z515 Encounter for palliative care: Secondary | ICD-10-CM | POA: Diagnosis not present

## 2019-10-14 DIAGNOSIS — I82621 Acute embolism and thrombosis of deep veins of right upper extremity: Secondary | ICD-10-CM | POA: Diagnosis not present

## 2019-10-14 DIAGNOSIS — R739 Hyperglycemia, unspecified: Secondary | ICD-10-CM

## 2019-10-14 DIAGNOSIS — Y848 Other medical procedures as the cause of abnormal reaction of the patient, or of later complication, without mention of misadventure at the time of the procedure: Secondary | ICD-10-CM | POA: Diagnosis not present

## 2019-10-14 DIAGNOSIS — T17490A Other foreign object in trachea causing asphyxiation, initial encounter: Secondary | ICD-10-CM | POA: Diagnosis not present

## 2019-10-14 DIAGNOSIS — U071 COVID-19: Secondary | ICD-10-CM

## 2019-10-14 DIAGNOSIS — I361 Nonrheumatic tricuspid (valve) insufficiency: Secondary | ICD-10-CM | POA: Diagnosis not present

## 2019-10-14 DIAGNOSIS — J9501 Hemorrhage from tracheostomy stoma: Secondary | ICD-10-CM | POA: Diagnosis not present

## 2019-10-14 DIAGNOSIS — E43 Unspecified severe protein-calorie malnutrition: Secondary | ICD-10-CM | POA: Diagnosis present

## 2019-10-14 DIAGNOSIS — R4182 Altered mental status, unspecified: Secondary | ICD-10-CM | POA: Diagnosis not present

## 2019-10-14 DIAGNOSIS — N170 Acute kidney failure with tubular necrosis: Secondary | ICD-10-CM | POA: Diagnosis present

## 2019-10-14 DIAGNOSIS — R6521 Severe sepsis with septic shock: Secondary | ICD-10-CM | POA: Diagnosis present

## 2019-10-14 DIAGNOSIS — E870 Hyperosmolality and hypernatremia: Secondary | ICD-10-CM | POA: Diagnosis not present

## 2019-10-14 DIAGNOSIS — I82B11 Acute embolism and thrombosis of right subclavian vein: Secondary | ICD-10-CM | POA: Diagnosis not present

## 2019-10-14 DIAGNOSIS — J9601 Acute respiratory failure with hypoxia: Secondary | ICD-10-CM

## 2019-10-14 DIAGNOSIS — J189 Pneumonia, unspecified organism: Secondary | ICD-10-CM | POA: Diagnosis present

## 2019-10-14 DIAGNOSIS — J80 Acute respiratory distress syndrome: Secondary | ICD-10-CM

## 2019-10-14 DIAGNOSIS — J95851 Ventilator associated pneumonia: Secondary | ICD-10-CM | POA: Diagnosis not present

## 2019-10-14 DIAGNOSIS — F1120 Opioid dependence, uncomplicated: Secondary | ICD-10-CM | POA: Diagnosis present

## 2019-10-14 DIAGNOSIS — R7989 Other specified abnormal findings of blood chemistry: Secondary | ICD-10-CM | POA: Diagnosis not present

## 2019-10-14 DIAGNOSIS — K219 Gastro-esophageal reflux disease without esophagitis: Secondary | ICD-10-CM | POA: Diagnosis present

## 2019-10-14 DIAGNOSIS — Y95 Nosocomial condition: Secondary | ICD-10-CM | POA: Diagnosis not present

## 2019-10-14 DIAGNOSIS — R609 Edema, unspecified: Secondary | ICD-10-CM | POA: Diagnosis not present

## 2019-10-14 DIAGNOSIS — Z66 Do not resuscitate: Secondary | ICD-10-CM | POA: Diagnosis not present

## 2019-10-14 DIAGNOSIS — I82890 Acute embolism and thrombosis of other specified veins: Secondary | ICD-10-CM | POA: Diagnosis not present

## 2019-10-14 DIAGNOSIS — I82431 Acute embolism and thrombosis of right popliteal vein: Secondary | ICD-10-CM | POA: Diagnosis not present

## 2019-10-14 DIAGNOSIS — I248 Other forms of acute ischemic heart disease: Secondary | ICD-10-CM | POA: Diagnosis not present

## 2019-10-14 DIAGNOSIS — A4189 Other specified sepsis: Secondary | ICD-10-CM | POA: Diagnosis present

## 2019-10-14 DIAGNOSIS — E874 Mixed disorder of acid-base balance: Secondary | ICD-10-CM | POA: Diagnosis present

## 2019-10-14 DIAGNOSIS — Z6829 Body mass index (BMI) 29.0-29.9, adult: Secondary | ICD-10-CM | POA: Diagnosis not present

## 2019-10-14 DIAGNOSIS — J1282 Pneumonia due to coronavirus disease 2019: Secondary | ICD-10-CM | POA: Diagnosis present

## 2019-10-14 DIAGNOSIS — G928 Other toxic encephalopathy: Secondary | ICD-10-CM | POA: Diagnosis not present

## 2019-10-14 LAB — POCT I-STAT 7, (LYTES, BLD GAS, ICA,H+H)
Acid-base deficit: 8 mmol/L — ABNORMAL HIGH (ref 0.0–2.0)
Bicarbonate: 18.8 mmol/L — ABNORMAL LOW (ref 20.0–28.0)
Calcium, Ion: 1.06 mmol/L — ABNORMAL LOW (ref 1.15–1.40)
HCT: 41 % (ref 39.0–52.0)
Hemoglobin: 13.9 g/dL (ref 13.0–17.0)
O2 Saturation: 100 %
Patient temperature: 97.9
Potassium: 6.1 mmol/L — ABNORMAL HIGH (ref 3.5–5.1)
Sodium: 138 mmol/L (ref 135–145)
TCO2: 20 mmol/L — ABNORMAL LOW (ref 22–32)
pCO2 arterial: 40.7 mmHg (ref 32.0–48.0)
pH, Arterial: 7.27 — ABNORMAL LOW (ref 7.350–7.450)
pO2, Arterial: 239 mmHg — ABNORMAL HIGH (ref 83.0–108.0)

## 2019-10-14 LAB — MAGNESIUM
Magnesium: 2.5 mg/dL — ABNORMAL HIGH (ref 1.7–2.4)
Magnesium: 2.9 mg/dL — ABNORMAL HIGH (ref 1.7–2.4)

## 2019-10-14 LAB — CBC
HCT: 43.2 % (ref 39.0–52.0)
Hemoglobin: 13.8 g/dL (ref 13.0–17.0)
MCH: 30.5 pg (ref 26.0–34.0)
MCHC: 31.9 g/dL (ref 30.0–36.0)
MCV: 95.6 fL (ref 80.0–100.0)
Platelets: 180 10*3/uL (ref 150–400)
RBC: 4.52 MIL/uL (ref 4.22–5.81)
RDW: 13.5 % (ref 11.5–15.5)
WBC: 28.9 10*3/uL — ABNORMAL HIGH (ref 4.0–10.5)
nRBC: 0 % (ref 0.0–0.2)

## 2019-10-14 LAB — BLOOD GAS, ARTERIAL
Acid-base deficit: 14.1 mmol/L — ABNORMAL HIGH (ref 0.0–2.0)
Acid-base deficit: 14.9 mmol/L — ABNORMAL HIGH (ref 0.0–2.0)
Bicarbonate: 11.7 mmol/L — ABNORMAL LOW (ref 20.0–28.0)
Bicarbonate: 11.9 mmol/L — ABNORMAL LOW (ref 20.0–28.0)
Drawn by: 35043
FIO2: 100
FIO2: 100
MECHVT: 620 mL
MECHVT: 620 mL
O2 Saturation: 86.4 %
O2 Saturation: 87.7 %
PEEP: 10 cmH2O
PEEP: 10 cmH2O
Patient temperature: 37
Patient temperature: 37
RATE: 18 resp/min
RATE: 18 resp/min
pCO2 arterial: 62.5 mmHg — ABNORMAL HIGH (ref 32.0–48.0)
pCO2 arterial: 49 mmHg — ABNORMAL HIGH (ref 32.0–48.0)
pH, Arterial: 7.013 — CL (ref 7.350–7.450)
pH, Arterial: 7.065 — CL (ref 7.350–7.450)
pO2, Arterial: 87.6 mmHg (ref 83.0–108.0)
pO2, Arterial: 83.8 mmHg (ref 83.0–108.0)

## 2019-10-14 LAB — GLUCOSE, CAPILLARY
Glucose-Capillary: 191 mg/dL — ABNORMAL HIGH (ref 70–99)
Glucose-Capillary: 217 mg/dL — ABNORMAL HIGH (ref 70–99)
Glucose-Capillary: 219 mg/dL — ABNORMAL HIGH (ref 70–99)
Glucose-Capillary: 235 mg/dL — ABNORMAL HIGH (ref 70–99)
Glucose-Capillary: 298 mg/dL — ABNORMAL HIGH (ref 70–99)

## 2019-10-14 LAB — HIV ANTIBODY (ROUTINE TESTING W REFLEX): HIV Screen 4th Generation wRfx: NONREACTIVE

## 2019-10-14 LAB — HEMOGLOBIN A1C
Hgb A1c MFr Bld: 6.6 % — ABNORMAL HIGH (ref 4.8–5.6)
Mean Plasma Glucose: 142.72 mg/dL

## 2019-10-14 LAB — PHOSPHORUS
Phosphorus: 7.5 mg/dL — ABNORMAL HIGH (ref 2.5–4.6)
Phosphorus: 7.2 mg/dL — ABNORMAL HIGH (ref 2.5–4.6)

## 2019-10-14 LAB — BASIC METABOLIC PANEL
Anion gap: 14 (ref 5–15)
BUN: 38 mg/dL — ABNORMAL HIGH (ref 6–20)
CO2: 18 mmol/L — ABNORMAL LOW (ref 22–32)
Calcium: 7.2 mg/dL — ABNORMAL LOW (ref 8.9–10.3)
Chloride: 107 mmol/L (ref 98–111)
Creatinine, Ser: 2.86 mg/dL — ABNORMAL HIGH (ref 0.61–1.24)
GFR calc Af Amer: 28 mL/min — ABNORMAL LOW (ref >60)
GFR calc non Af Amer: 24 mL/min — ABNORMAL LOW (ref >60)
Glucose, Bld: 314 mg/dL — ABNORMAL HIGH (ref 70–99)
Potassium: 6.2 mmol/L — ABNORMAL HIGH (ref 3.5–5.1)
Sodium: 139 mmol/L (ref 135–145)

## 2019-10-14 LAB — PROTIME-INR
INR: 2.3 — ABNORMAL HIGH (ref 0.8–1.2)
Prothrombin Time: 24.6 seconds — ABNORMAL HIGH (ref 11.4–15.2)

## 2019-10-14 LAB — MRSA PCR SCREENING: MRSA by PCR: NEGATIVE

## 2019-10-14 LAB — LACTIC ACID, PLASMA: Lactic Acid, Venous: 2.2 mmol/L (ref 0.5–1.9)

## 2019-10-14 LAB — CBG MONITORING, ED: Glucose-Capillary: 293 mg/dL — ABNORMAL HIGH (ref 70–99)

## 2019-10-14 MED ORDER — CHLORHEXIDINE GLUCONATE CLOTH 2 % EX PADS
6.0000 | MEDICATED_PAD | Freq: Every day | CUTANEOUS | Status: DC
  Administered 2019-10-14 – 2019-11-04 (×22): 6 via TOPICAL

## 2019-10-14 MED ORDER — ENOXAPARIN SODIUM 40 MG/0.4ML ~~LOC~~ SOLN
40.0000 mg | SUBCUTANEOUS | Status: DC
  Administered 2019-10-14: 40 mg via SUBCUTANEOUS
  Filled 2019-10-14 (×2): qty 0.4

## 2019-10-14 MED ORDER — LACTATED RINGERS IV BOLUS
1000.0000 mL | Freq: Once | INTRAVENOUS | Status: AC
  Administered 2019-10-14: 1000 mL via INTRAVENOUS

## 2019-10-14 MED ORDER — LORAZEPAM 2 MG/ML IJ SOLN
1.0000 mg | INTRAMUSCULAR | Status: DC | PRN
  Administered 2019-10-14 (×3): 1 mg via INTRAVENOUS
  Filled 2019-10-14 (×3): qty 1

## 2019-10-14 MED ORDER — VITAL 1.5 CAL PO LIQD
1000.0000 mL | ORAL | Status: DC
  Administered 2019-10-14 – 2019-10-26 (×8): 1000 mL
  Filled 2019-10-14 (×8): qty 1000

## 2019-10-14 MED ORDER — SODIUM BICARBONATE 8.4 % IV SOLN
100.0000 meq | Freq: Once | INTRAVENOUS | Status: DC
  Filled 2019-10-14: qty 50

## 2019-10-14 MED ORDER — PROSOURCE TF PO LIQD
90.0000 mL | Freq: Two times a day (BID) | ORAL | Status: DC
  Administered 2019-10-14 – 2019-11-02 (×38): 90 mL
  Filled 2019-10-14 (×39): qty 90

## 2019-10-14 MED ORDER — SODIUM ZIRCONIUM CYCLOSILICATE 10 G PO PACK
10.0000 g | PACK | Freq: Once | ORAL | Status: AC
  Administered 2019-10-14: 10 g via ORAL
  Filled 2019-10-14: qty 1

## 2019-10-14 MED ORDER — MIDAZOLAM HCL 2 MG/2ML IJ SOLN
2.0000 mg | INTRAMUSCULAR | Status: AC | PRN
  Administered 2019-10-14 (×3): 2 mg via INTRAVENOUS
  Filled 2019-10-14 (×3): qty 2

## 2019-10-14 MED ORDER — DOCUSATE SODIUM 50 MG/5ML PO LIQD
100.0000 mg | Freq: Two times a day (BID) | ORAL | Status: DC
  Administered 2019-10-14 – 2019-10-25 (×23): 100 mg
  Filled 2019-10-14 (×23): qty 10

## 2019-10-14 MED ORDER — DEXMEDETOMIDINE HCL IN NACL 400 MCG/100ML IV SOLN
0.0000 ug/kg/h | INTRAVENOUS | Status: DC
  Administered 2019-10-14 (×2): 1.2 ug/kg/h via INTRAVENOUS
  Administered 2019-10-14: 0.4 ug/kg/h via INTRAVENOUS
  Administered 2019-10-14 – 2019-10-16 (×13): 1.2 ug/kg/h via INTRAVENOUS
  Filled 2019-10-14 (×10): qty 100
  Filled 2019-10-14: qty 200
  Filled 2019-10-14 (×5): qty 100

## 2019-10-14 MED ORDER — SODIUM CHLORIDE 0.9 % IV SOLN
2.0000 g | Freq: Two times a day (BID) | INTRAVENOUS | Status: DC
  Administered 2019-10-14 – 2019-10-17 (×8): 2 g via INTRAVENOUS
  Filled 2019-10-14 (×8): qty 2

## 2019-10-14 MED ORDER — VANCOMYCIN VARIABLE DOSE PER UNSTABLE RENAL FUNCTION (PHARMACIST DOSING): Status: DC

## 2019-10-14 MED ORDER — FREE WATER
200.0000 mL | Freq: Four times a day (QID) | Status: DC
  Administered 2019-10-14 – 2019-10-15 (×4): 200 mL

## 2019-10-14 MED ORDER — VANCOMYCIN HCL 750 MG/150ML IV SOLN
750.0000 mg | Freq: Two times a day (BID) | INTRAVENOUS | Status: DC
  Filled 2019-10-14: qty 150

## 2019-10-14 MED ORDER — BARICITINIB 2 MG PO TABS
2.0000 mg | ORAL_TABLET | Freq: Every day | ORAL | Status: DC
  Filled 2019-10-14: qty 1

## 2019-10-14 MED ORDER — ORAL CARE MOUTH RINSE
15.0000 mL | OROMUCOSAL | Status: DC
  Administered 2019-10-14 – 2019-11-04 (×207): 15 mL via OROMUCOSAL

## 2019-10-14 MED ORDER — CALCIUM GLUCONATE-NACL 1-0.675 GM/50ML-% IV SOLN
1.0000 g | Freq: Once | INTRAVENOUS | Status: AC
  Administered 2019-10-14: 1000 mg via INTRAVENOUS
  Filled 2019-10-14: qty 50

## 2019-10-14 MED ORDER — INSULIN ASPART 100 UNIT/ML ~~LOC~~ SOLN
0.0000 [IU] | SUBCUTANEOUS | Status: DC
  Administered 2019-10-14: 4 [IU] via SUBCUTANEOUS
  Administered 2019-10-14: 11 [IU] via SUBCUTANEOUS
  Administered 2019-10-14 (×3): 7 [IU] via SUBCUTANEOUS
  Administered 2019-10-15: 11 [IU] via SUBCUTANEOUS
  Administered 2019-10-15: 4 [IU] via SUBCUTANEOUS
  Administered 2019-10-15: 7 [IU] via SUBCUTANEOUS
  Administered 2019-10-15: 4 [IU] via SUBCUTANEOUS
  Administered 2019-10-15: 7 [IU] via SUBCUTANEOUS
  Administered 2019-10-16 (×2): 11 [IU] via SUBCUTANEOUS
  Administered 2019-10-16: 7 [IU] via SUBCUTANEOUS
  Administered 2019-10-16: 15 [IU] via SUBCUTANEOUS
  Administered 2019-10-16 (×2): 4 [IU] via SUBCUTANEOUS
  Administered 2019-10-17 (×6): 7 [IU] via SUBCUTANEOUS
  Administered 2019-10-17: 11 [IU] via SUBCUTANEOUS
  Administered 2019-10-18: 7 [IU] via SUBCUTANEOUS
  Administered 2019-10-18 (×4): 4 [IU] via SUBCUTANEOUS
  Administered 2019-10-18 – 2019-10-19 (×4): 7 [IU] via SUBCUTANEOUS
  Administered 2019-10-19 – 2019-10-20 (×4): 4 [IU] via SUBCUTANEOUS
  Administered 2019-10-20: 7 [IU] via SUBCUTANEOUS
  Administered 2019-10-20 (×3): 4 [IU] via SUBCUTANEOUS
  Administered 2019-10-21: 3 [IU] via SUBCUTANEOUS
  Administered 2019-10-21: 4 [IU] via SUBCUTANEOUS
  Administered 2019-10-22 – 2019-10-23 (×2): 3 [IU] via SUBCUTANEOUS
  Administered 2019-10-23: 4 [IU] via SUBCUTANEOUS
  Administered 2019-10-23 – 2019-10-24 (×2): 3 [IU] via SUBCUTANEOUS
  Administered 2019-10-24 – 2019-10-25 (×6): 4 [IU] via SUBCUTANEOUS
  Administered 2019-10-25 – 2019-10-28 (×10): 3 [IU] via SUBCUTANEOUS
  Administered 2019-10-28: 4 [IU] via SUBCUTANEOUS
  Administered 2019-10-29 – 2019-10-30 (×6): 3 [IU] via SUBCUTANEOUS
  Administered 2019-10-31: 4 [IU] via SUBCUTANEOUS
  Administered 2019-10-31: 3 [IU] via SUBCUTANEOUS
  Administered 2019-11-01: 4 [IU] via SUBCUTANEOUS
  Administered 2019-11-01 (×2): 3 [IU] via SUBCUTANEOUS
  Administered 2019-11-01: 4 [IU] via SUBCUTANEOUS
  Administered 2019-11-01: 3 [IU] via SUBCUTANEOUS
  Administered 2019-11-01: 4 [IU] via SUBCUTANEOUS
  Administered 2019-11-02 (×3): 3 [IU] via SUBCUTANEOUS
  Administered 2019-11-02: 7 [IU] via SUBCUTANEOUS
  Administered 2019-11-02: 4 [IU] via SUBCUTANEOUS
  Administered 2019-11-03 – 2019-11-04 (×5): 3 [IU] via SUBCUTANEOUS

## 2019-10-14 MED ORDER — FENTANYL BOLUS VIA INFUSION
50.0000 ug | INTRAVENOUS | Status: AC | PRN
  Administered 2019-10-15 – 2019-10-22 (×10): 50 ug via INTRAVENOUS
  Filled 2019-10-14: qty 50

## 2019-10-14 MED ORDER — NOREPINEPHRINE 4 MG/250ML-% IV SOLN
2.0000 ug/min | INTRAVENOUS | Status: DC
  Administered 2019-10-14: 3 ug/min via INTRAVENOUS
  Administered 2019-10-14 – 2019-10-16 (×2): 2 ug/min via INTRAVENOUS
  Filled 2019-10-14 (×4): qty 250

## 2019-10-14 MED ORDER — SODIUM CHLORIDE 0.9 % IV SOLN
250.0000 mL | INTRAVENOUS | Status: DC
  Administered 2019-10-17: 500 mL via INTRAVENOUS
  Administered 2019-10-19 – 2019-11-03 (×3): 250 mL via INTRAVENOUS

## 2019-10-14 MED ORDER — FENTANYL CITRATE (PF) 100 MCG/2ML IJ SOLN
50.0000 ug | Freq: Once | INTRAMUSCULAR | Status: AC
  Administered 2019-10-14: 50 ug via INTRAVENOUS

## 2019-10-14 MED ORDER — POLYETHYLENE GLYCOL 3350 17 G PO PACK
17.0000 g | PACK | Freq: Every day | ORAL | Status: DC
  Administered 2019-10-14 – 2019-10-25 (×10): 17 g
  Filled 2019-10-14 (×10): qty 1

## 2019-10-14 MED ORDER — METHYLPREDNISOLONE SODIUM SUCC 40 MG IJ SOLR
40.0000 mg | Freq: Two times a day (BID) | INTRAMUSCULAR | Status: DC
  Administered 2019-10-14 – 2019-10-15 (×4): 40 mg via INTRAVENOUS
  Filled 2019-10-14 (×6): qty 1

## 2019-10-14 MED ORDER — FENTANYL 2500MCG IN NS 250ML (10MCG/ML) PREMIX INFUSION
0.0000 ug/h | INTRAVENOUS | Status: DC
  Administered 2019-10-14 (×2): 400 ug/h via INTRAVENOUS
  Administered 2019-10-14: 200 ug/h via INTRAVENOUS
  Administered 2019-10-15: 400 ug/h via INTRAVENOUS
  Administered 2019-10-15: 200 ug/h via INTRAVENOUS
  Administered 2019-10-15: 400 ug/h via INTRAVENOUS
  Administered 2019-10-16 (×2): 200 ug/h via INTRAVENOUS
  Administered 2019-10-17: 50 ug/h via INTRAVENOUS
  Administered 2019-10-17: 200 ug/h via INTRAVENOUS
  Administered 2019-10-19 (×2): 100 ug/h via INTRAVENOUS
  Administered 2019-10-20 – 2019-10-21 (×3): 200 ug/h via INTRAVENOUS
  Administered 2019-10-22: 100 ug/h via INTRAVENOUS
  Administered 2019-10-23: 225 ug/h via INTRAVENOUS
  Administered 2019-10-23: 250 ug/h via INTRAVENOUS
  Administered 2019-10-23: 150 ug/h via INTRAVENOUS
  Administered 2019-10-24 (×3): 400 ug/h via INTRAVENOUS
  Administered 2019-10-24 – 2019-10-25 (×2): 350 ug/h via INTRAVENOUS
  Administered 2019-10-25: 325 ug/h via INTRAVENOUS
  Administered 2019-10-25: 400 ug/h via INTRAVENOUS
  Administered 2019-10-26 (×2): 350 ug/h via INTRAVENOUS
  Administered 2019-10-26: 250 ug/h via INTRAVENOUS
  Administered 2019-10-26: 350 ug/h via INTRAVENOUS
  Administered 2019-10-27 (×3): 400 ug/h via INTRAVENOUS
  Administered 2019-10-27: 350 ug/h via INTRAVENOUS
  Administered 2019-10-28: 200 ug/h via INTRAVENOUS
  Administered 2019-10-28: 350 ug/h via INTRAVENOUS
  Administered 2019-10-28: 400 ug/h via INTRAVENOUS
  Administered 2019-10-29: 175 ug/h via INTRAVENOUS
  Administered 2019-10-29: 350 ug/h via INTRAVENOUS
  Administered 2019-10-29: 175 ug/h via INTRAVENOUS
  Administered 2019-10-30: 100 ug/h via INTRAVENOUS
  Administered 2019-10-31: 400 ug/h via INTRAVENOUS
  Administered 2019-10-31: 200 ug/h via INTRAVENOUS
  Administered 2019-10-31: 400 ug/h via INTRAVENOUS
  Administered 2019-11-01: 200 ug/h via INTRAVENOUS
  Administered 2019-11-01: 150 ug/h via INTRAVENOUS
  Administered 2019-11-02: 200 ug/h via INTRAVENOUS
  Administered 2019-11-02 – 2019-11-03 (×3): 250 ug/h via INTRAVENOUS
  Administered 2019-11-04: 200 ug/h via INTRAVENOUS
  Filled 2019-10-14 (×51): qty 250

## 2019-10-14 MED ORDER — CHLORHEXIDINE GLUCONATE 0.12% ORAL RINSE (MEDLINE KIT)
15.0000 mL | Freq: Two times a day (BID) | OROMUCOSAL | Status: DC
  Administered 2019-10-14 – 2019-11-04 (×43): 15 mL via OROMUCOSAL

## 2019-10-14 MED ORDER — MIDAZOLAM HCL 2 MG/2ML IJ SOLN
2.0000 mg | INTRAMUSCULAR | Status: DC | PRN
  Administered 2019-10-14 – 2019-10-18 (×13): 2 mg via INTRAVENOUS
  Filled 2019-10-14 (×14): qty 2

## 2019-10-14 MED ORDER — VANCOMYCIN HCL 1500 MG/300ML IV SOLN
1500.0000 mg | Freq: Once | INTRAVENOUS | Status: AC
  Administered 2019-10-14: 1500 mg via INTRAVENOUS
  Filled 2019-10-14: qty 300

## 2019-10-14 NOTE — ED Provider Notes (Signed)
.  Central Line  Date/Time: 10/14/2019 3:33 AM Performed by: Zadie Rhine, MD Authorized by: Zadie Rhine, MD   Consent:    Consent obtained:  Emergent situation Pre-procedure details:    Hand hygiene: Hand hygiene performed prior to insertion     Sterile barrier technique: All elements of maximal sterile technique followed     Skin preparation:  ChloraPrep   Skin preparation agent: Skin preparation agent completely dried prior to procedure   Anesthesia (see MAR for exact dosages):    Anesthesia method:  Local infiltration   Local anesthetic:  Lidocaine 1% w/o epi Procedure details:    Location:  R internal jugular   Patient position:  Trendelenburg   Procedural supplies:  Triple lumen   Catheter size:  7 Fr   Landmarks identified: yes     Ultrasound guidance: yes     Sterile ultrasound techniques: Sterile gel and sterile probe covers were used     Number of attempts:  1   Successful placement: yes   Post-procedure details:    Post-procedure:  Dressing applied and line sutured   Assessment:  Blood return through all ports, no pneumothorax on x-ray, placement verified by x-ray and free fluid flow   Patient tolerance of procedure:  Tolerated well, no immediate complications  EMERGENCY DEPARTMENT  US GUIDANCE EXAM Emergency Ultrasound:  US Guidance for Needle Guidance  INDICATIONS: Difficult vascular access Linear probe used in real-time to visualize location of needle entry through skin.   PERFORMED BY: Myself IMAGES ARCHIVED?: Yes LIMITATIONS: None VIEWS USED: Longitudinal INTERPRETATION: Needle visualized within vein  Patient had only one small IV.  Due to hypotension, lack of appropriate access, I placed a central line emergently.  Emergency consent was done as there is no healthcare agent available, patient is intubated Right IJ central line was placed without difficulty. IV fluids and pressors have been ordered for blood pressure support.   Zadie Rhine,  MD 10/14/19 [phone]

## 2019-10-14 NOTE — Progress Notes (Signed)
Inpatient Diabetes Program Recommendations  AACE/ADA: New Consensus Statement on Inpatient Glycemic Control (2015)  Target Ranges:  Prepandial:   less than 140 mg/dL      Peak postprandial:   less than 180 mg/dL (1-2 hours)      Critically ill patients:  140 - 180 mg/dL   Lab Results  Component Value Date   GLUCAP 217 (H) 10/14/2019   HGBA1C 6.6 (H) 10/14/2019    Review of Glycemic Control Results for Bruce Escobar, Bruce Escobar (MRN [id number]) as of 10/14/2019 12:47  Ref. Range 10/14/2019 05:44 10/14/2019 07:36 10/14/2019 11:24  Glucose-Capillary Latest Ref Range: 70 - 99 mg/dL 615 (H) 379 (H) 432 (H)   Diabetes history:  No DM hx Current orders for Inpatient glycemic control:  Novolog 0-20 units q4h Solumedrol 40 mg q12h Vita 1.5 @ 21ml/hr  Inpatient Diabetes Program Recommendations:    If blood sugar remains elevated please consider Novolog 3 units tube feed coverage q4h-stop if feeds are held or discontinued.  Will continue to follow while inpatient.  Thank you, Dulce Sellar, RN, BSN Diabetes Coordinator Inpatient Diabetes Program [phone] (team pager from 8a-5p)

## 2019-10-14 NOTE — Progress Notes (Signed)
Pharmacy Antibiotic Note  Bruce Escobar is a 54 y.o. male admitted on 10/11/2019 with possible HCAP 2/2 to COVID 19 infection.  Pharmacy has been consulted for cefepime and vancomycin dosing. Patient WBC is elevated and LA is 4.3. Patient is afebrile at this and received azithromycin and CTX in the ED. Will cover empirically and continue to monitor clinical status. Patients renal function appears to be worsening, but baseline unclear continue to monitor closely.   Plan: Start vancomycin 1500mg  x1 followed by 750mg  Q12H  Start Cefepime 2G Q12H  F/u cultures and sensitivities as well as renal function  F/u LOT and VT when indicated   Height: 5\' 11"  (180.3 cm) Weight: 81.6 kg (180 lb) IBW/kg (Calculated) : 75.3  Temp (24hrs), Avg:97.8 F (36.6 C), Min:96.1 F (35.6 C), Max:100.4 F (38 C)  Recent Labs  Lab 09/17/2019 2018 10/12/2019 2145  WBC 19.1*  --   CREATININE 1.53*  --   LATICACIDVEN 4.2* 4.3*    Estimated Creatinine Clearance: 59.5 mL/min (A) (by C-G formula based on SCr of 1.53 mg/dL (H)).    Allergies  Allergen Reactions  . Flagyl [Metronidazole]     Face flush  . Ibuprofen     Face Flush  . Penicillins     Rash    Antimicrobials this admission: 9/29 Aztih x1  9/29 CTX x1 9/30 Vanc >>  9/30 Cefepime >>  Dose adjustments this admission: NA  Microbiology results: 9/29 (drawn at [facility])>>ngtd 9/30 BCx: pend 9/30 Sputum: pend    Thank you for allowing pharmacy to be a part of this patient's care.  10/29, PharmD, MBA Pharmacy Resident [phone] 10/14/2019 9:08 AM

## 2019-10-14 NOTE — ED Provider Notes (Signed)
Bed request has been placed at [facility] ICU, but there will likely no beds available tonight. I have also instructed her secretary to call all local centers including [facility], [place], [place] to see if there is any bed availability for him to be transferred. Patient is now lying prone with a pulse ox at approximately 89%. We will continue to look  for transfer possibilities Will also attempt to call family   Zadie Rhine, MD 10/14/19 [phone]

## 2019-10-14 NOTE — ED Notes (Signed)
Date and time results received: 10/14/19 ABG (use smartphrase ".now" to insert current time)  Test: pH(venous) Critical Value: 7.013  Name of Provider Notified: Steva Colder MD  Orders Received? Or Actions Taken?:

## 2019-10-14 NOTE — Progress Notes (Signed)
Patient was unproned so that Dr. Bebe Shaggy could put in right IJ in patient.  Patient has maintained good sats so far, will continue to monitor.

## 2019-10-14 NOTE — Progress Notes (Addendum)
Was called to patient's room.  Was told patient's sats were dropping even while on ventilator.  Went into room and tried to raise PEEP on ventilator to see if patient improved.  Patient was already at 100% FIO2 at the time.  There was no improvement.  Was not sure if even a recruitment maneuver would benefit.  Our only recourse was to prone patient.  Patient had already started to mottle.  This RT and several RNs wrapped patient in blankets and proned patient while I maintained airway.  Patient is currently turned to the left.  Sats were in low 70s before proning, now sats are at 94%.  Will turn head in two hours and will continue to monitor patient.

## 2019-10-14 NOTE — Progress Notes (Signed)
Initial Nutrition Assessment  DOCUMENTATION CODES:   Not applicable  INTERVENTION:   Initiate tube feeding via OG tube: Vital 1.5 at 30 ml/h increase by 10 ml every 4 hours to goal rate of 60 ml/hr(1440 ml per day) Prosource TF 90 ml BID  Provides 2320 kcal, 141 gm protein, 1094 ml free water daily  200 ml free water every 6 hours  Total free water:  1894 ml    NUTRITION DIAGNOSIS:   Increased nutrient needs related to  (COVID) as evidenced by estimated needs.  GOAL:   Patient will meet greater than or equal to 90% of their needs   MONITOR:   TF tolerance, Labs  REASON FOR ASSESSMENT:   Consult, Ventilator Enteral/tube feeding initiation and management  ASSESSMENT:   Pt with PMH of enlarged cardiac ventricle and GERD who was dx with COVID PNA at [facility] on 9/22. Pt ultimately left hospital AMA and then was admitted to [facility] 9/29 and was intubated and tx to [facility].   Pt sedated and intubated. Unable to determine nutrition hx.   Patient is currently intubated on ventilator support MV: 14.1 L/min Temp (24hrs), Avg:97.8 F (36.6 C), Min:96.1 F (35.6 C), Max:100.4 F (38 C)  Medications reviewed and include: colace, SSI, solumedrol, miralax Precedex Fentanyl Ativan Levophed @ 10 mcg  Remdesivir Vancomycin  Labs reviewed: K+ 6.1, magnesium: 2.5, PO4: 7.5 A1C: 6.6     Diet Order:   Diet Order            Diet NPO time specified  Diet effective now                 EDUCATION NEEDS:   No education needs have been identified at this time  Skin:  Skin Assessment: Reviewed RN Assessment  Last BM:  unknown  Height:   Ht Readings from Last 1 Encounters:  10/14/19 5\' 11"  (1.803 m)    Weight:   Wt Readings from Last 1 Encounters:  Oct 22, 2019 81.6 kg    Ideal Body Weight:     BMI:  Body mass index is 25.1 kg/m.  Estimated Nutritional Needs:   Kcal:  2150-2400  Protein:  125-140 grams  Fluid:  > 2L/day  01-28-1986., RD, LDN, CNSC See  [facility] for contact information

## 2019-10-14 NOTE — Progress Notes (Signed)
PHARMACY NOTE:  ANTIMICROBIAL RENAL DOSAGE ADJUSTMENT  Current antimicrobial regimen includes a mismatch between antimicrobial dosage and estimated renal function.  As per policy approved by the Pharmacy & Therapeutics and Medical Executive Committees, the antimicrobial dosage will be adjusted accordingly.  Current antimicrobial dosage:  Vancomycin 750mg  Q12H   Indication: HCAP vs. Sepsis  Renal Function: Scr 2.86 and variable  Estimated Creatinine Clearance: 31.8 mL/min (A) (by C-G formula based on SCr of 2.86 mg/dL (H)). []      On intermittent HD, scheduled: []      On CRRT    Antimicrobial dosage has been changed to:  Changed to variable vancomycin dosing per renal function   Additional comments: After speaking with ID will adjust this patients vancomycin to variable dosing per pharmacy due to patients rapidly changing renal function. Will complete vancomycin load of 1500mg  x1 and reevaluate renal function on 10/1 and adjust dosing then. Will closely follow plans to continue vancomycin therapy per CCM.    Thank you for allowing pharmacy to be a part of this patient's care.  , PharmD, MBA Pharmacy Resident [phone] 10/14/2019 12:19 PM

## 2019-10-14 NOTE — Progress Notes (Signed)
eLink Physician-Brief Progress Note Patient Name: Bruce Escobar DOB: 1965-07-23 MRN: [id number]   Date of Service  10/14/2019  HPI/Events of Note  Covid 19 pneumonia patient with acute hypoxemic respiratory failure, he just arrived 45M 02 from [facility] ED. PH is 7.0 and it's a metabolic acidosis.   eICU Interventions  Sodium bicarbonate 100 meq iv bolus x 1        Ghazal Pevey U Anab Vivar 10/14/2019, 7:03 AM

## 2019-10-14 NOTE — ED Provider Notes (Addendum)
D/w sister who gave me his parents phone numbers: Father Bruce Escobar [phone]; [phone] Mother- Bruce Escobar [phone]   Bruce Rhine, MD 10/14/19 [phone]  5:39 AM I have attempted to call his mother and father at the numbers above, but there was no answers.  Will defer to admitting team for further family contact   Bruce Rhine, MD 10/14/19 0539  5:47 AM I heard back from patient's mother and she was updated on patient's condition.  She was informed the patient is in critical condition. Patient's mother lives in [place].  She would like another call back from the critical care team once he is transferred to [facility], MD 10/14/19 [phone]

## 2019-10-14 NOTE — Progress Notes (Signed)
Pharmacy is unable to complete a medication history. The patient is unable to be interviewed and efforts to reach family or caregivers have been unsuccessful.   All records on his PTA med list are from many years ago. No prescriptions records were found for the past 6 months.   Contact pharmacy if we can be of further assistance.  Charolotte Eke, PharmD Admin. Coordinator of Transitions of Care 10/14/2019,9:29 AM

## 2019-10-14 NOTE — Progress Notes (Signed)
Vent changes made per Dr. Celine Mans post ABG results.

## 2019-10-14 NOTE — ED Provider Notes (Signed)
No beds available at [facility], [place] or [place]. I spoke to [employer]. Terrilee Croak who is patient's friend. He was unaware patient was in the hospital. He did give me his daughters phone number [phone]. She did not answer and the voicemail was full. He also gave me the sisters number at [phone] but there was no answer and I left a voicemail   Zadie Rhine, MD 10/14/19 [phone]

## 2019-10-14 NOTE — ED Provider Notes (Signed)
.  Critical Care Performed by: Zadie Rhine, MD Authorized by: Zadie Rhine, MD   Critical care provider statement:    Critical care time (minutes):  60   Critical care start time:  10/14/2019 3:00 AM   Critical care end time:  10/14/2019 4:00 AM   Critical care time was exclusive of:  Separately billable procedures and treating other patients   Critical care was necessary to treat or prevent imminent or life-threatening deterioration of the following conditions:  Respiratory failure, sepsis and shock   Critical care was time spent personally by me on the following activities:  Discussions with consultants, evaluation of patient's response to treatment, examination of patient, review of old charts, re-evaluation of patient's condition, pulse oximetry and ordering and review of radiographic studies   I assumed direction of critical care for this patient from another provider in my specialty: yes     Bruce Escobar was evaluated in Emergency Department on 10/14/2019 for the symptoms described in the history of present illness. He was evaluated in the context of the global COVID-19 pandemic, which necessitated consideration that the patient might be at risk for infection with the SARS-CoV-2 virus that causes COVID-19. Institutional protocols and algorithms that pertain to the evaluation of patients at risk for COVID-19 are in a state of rapid change based on information released by regulatory bodies including the CDC and federal and state organizations. These policies and algorithms were followed during the patient's care in the ED.   Oxygenation has improved, patient is hypotensive.  IV fluids and IV pressors have been ordered for patient  patient now has a bed at [facility]   Zadie Rhine, MD 10/14/19 [phone]

## 2019-10-14 NOTE — ED Notes (Signed)
Pt linens changed throughout his stay d/t saturation of sweat. Pt temp currently 35.9C (96.6 F) which has steadily decreased to this point. EDP made aware. Warm blankets provided for pt.
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
## 2019-10-14 NOTE — ED Provider Notes (Signed)
I assumed care in signout to monitor patient while in the ED. Patient with diagnosis of Covid pneumonia with acute respiratory failure requiring mechanical ventilation Currently there are no ICU beds available at [facility]. When I checked on the patient he was tachycardic, hypotensive and hypoxic with ventilator settings of 10 of PEEP and 100% FiO2 I have called back to the critical care team at [facility] to assist in placement.  Patient is critically ill   Zadie Rhine, MD 10/14/19 [phone]

## 2019-10-15 ENCOUNTER — Inpatient Hospital Stay (HOSPITAL_COMMUNITY): Payer: Medicaid Other

## 2019-10-15 DIAGNOSIS — U071 COVID-19: Secondary | ICD-10-CM | POA: Diagnosis not present

## 2019-10-15 DIAGNOSIS — R4182 Altered mental status, unspecified: Secondary | ICD-10-CM

## 2019-10-15 LAB — COMPREHENSIVE METABOLIC PANEL
ALT: 1561 U/L — ABNORMAL HIGH (ref 0–44)
AST: 1612 U/L — ABNORMAL HIGH (ref 15–41)
Albumin: 1.9 g/dL — ABNORMAL LOW (ref 3.5–5.0)
Alkaline Phosphatase: 92 U/L (ref 38–126)
Anion gap: 9 (ref 5–15)
BUN: 56 mg/dL — ABNORMAL HIGH (ref 6–20)
CO2: 21 mmol/L — ABNORMAL LOW (ref 22–32)
Calcium: 7 mg/dL — ABNORMAL LOW (ref 8.9–10.3)
Chloride: 114 mmol/L — ABNORMAL HIGH (ref 98–111)
Creatinine, Ser: 2.41 mg/dL — ABNORMAL HIGH (ref 0.61–1.24)
GFR calc Af Amer: 34 mL/min — ABNORMAL LOW (ref >60)
GFR calc non Af Amer: 30 mL/min — ABNORMAL LOW (ref >60)
Glucose, Bld: 285 mg/dL — ABNORMAL HIGH (ref 70–99)
Potassium: 6.3 mmol/L (ref 3.5–5.1)
Sodium: 144 mmol/L (ref 135–145)
Total Bilirubin: 0.9 mg/dL (ref 0.3–1.2)
Total Protein: 5.4 g/dL — ABNORMAL LOW (ref 6.5–8.1)

## 2019-10-15 LAB — CBC WITH DIFFERENTIAL/PLATELET
Abs Immature Granulocytes: 0.38 10*3/uL — ABNORMAL HIGH (ref 0.00–0.07)
Basophils Absolute: 0.1 10*3/uL (ref 0.0–0.1)
Basophils Relative: 0 %
Eosinophils Absolute: 0 10*3/uL (ref 0.0–0.5)
Eosinophils Relative: 0 %
HCT: 41.2 % (ref 39.0–52.0)
Hemoglobin: 13 g/dL (ref 13.0–17.0)
Immature Granulocytes: 1 %
Lymphocytes Relative: 2 %
Lymphs Abs: 0.6 10*3/uL — ABNORMAL LOW (ref 0.7–4.0)
MCH: 31.6 pg (ref 26.0–34.0)
MCHC: 31.6 g/dL (ref 30.0–36.0)
MCV: 100 fL (ref 80.0–100.0)
Monocytes Absolute: 1.1 10*3/uL — ABNORMAL HIGH (ref 0.1–1.0)
Monocytes Relative: 4 %
Neutro Abs: 24.9 10*3/uL — ABNORMAL HIGH (ref 1.7–7.7)
Neutrophils Relative %: 93 %
Platelets: 155 10*3/uL (ref 150–400)
RBC: 4.12 MIL/uL — ABNORMAL LOW (ref 4.22–5.81)
RDW: 14 % (ref 11.5–15.5)
WBC: 27 10*3/uL — ABNORMAL HIGH (ref 4.0–10.5)
nRBC: 0.2 % (ref 0.0–0.2)

## 2019-10-15 LAB — POCT I-STAT 7, (LYTES, BLD GAS, ICA,H+H)
Acid-base deficit: 6 mmol/L — ABNORMAL HIGH (ref 0.0–2.0)
Bicarbonate: 21 mmol/L (ref 20.0–28.0)
Calcium, Ion: 1.09 mmol/L — ABNORMAL LOW (ref 1.15–1.40)
HCT: 38 % — ABNORMAL LOW (ref 39.0–52.0)
Hemoglobin: 12.9 g/dL — ABNORMAL LOW (ref 13.0–17.0)
O2 Saturation: 90 %
Potassium: 6.3 mmol/L (ref 3.5–5.1)
Sodium: 144 mmol/L (ref 135–145)
TCO2: 22 mmol/L (ref 22–32)
pCO2 arterial: 47.2 mmHg (ref 32.0–48.0)
pH, Arterial: 7.256 — ABNORMAL LOW (ref 7.350–7.450)
pO2, Arterial: 67 mmHg — ABNORMAL LOW (ref 83.0–108.0)

## 2019-10-15 LAB — D-DIMER, QUANTITATIVE: D-Dimer, Quant: >20 ug/mL-FEU — ABNORMAL HIGH (ref 0.00–0.50)

## 2019-10-15 LAB — GLUCOSE, CAPILLARY
Glucose-Capillary: 224 mg/dL — ABNORMAL HIGH (ref 70–99)
Glucose-Capillary: 229 mg/dL — ABNORMAL HIGH (ref 70–99)
Glucose-Capillary: 252 mg/dL — ABNORMAL HIGH (ref 70–99)
Glucose-Capillary: 164 mg/dL — ABNORMAL HIGH (ref 70–99)
Glucose-Capillary: 185 mg/dL — ABNORMAL HIGH (ref 70–99)

## 2019-10-15 LAB — PHOSPHORUS: Phosphorus: 5.4 mg/dL — ABNORMAL HIGH (ref 2.5–4.6)

## 2019-10-15 LAB — MAGNESIUM: Magnesium: 2.8 mg/dL — ABNORMAL HIGH (ref 1.7–2.4)

## 2019-10-15 LAB — FERRITIN: Ferritin: >7500 ng/mL — ABNORMAL HIGH (ref 24–336)

## 2019-10-15 MED ORDER — LORAZEPAM 2 MG/ML IJ SOLN
2.0000 mg | Freq: Once | INTRAMUSCULAR | Status: AC
  Administered 2019-10-15: 2 mg via INTRAVENOUS
  Filled 2019-10-15: qty 1

## 2019-10-15 MED ORDER — CALCIUM GLUCONATE-NACL 1-0.675 GM/50ML-% IV SOLN
1.0000 g | Freq: Once | INTRAVENOUS | Status: AC
  Administered 2019-10-15: 1000 mg via INTRAVENOUS
  Filled 2019-10-15: qty 50

## 2019-10-15 MED ORDER — INSULIN GLARGINE 100 UNIT/ML ~~LOC~~ SOLN
5.0000 [IU] | Freq: Two times a day (BID) | SUBCUTANEOUS | Status: DC
  Administered 2019-10-15 (×2): 5 [IU] via SUBCUTANEOUS
  Filled 2019-10-15 (×5): qty 0.05

## 2019-10-15 MED ORDER — FOLIC ACID 1 MG PO TABS
1.0000 mg | ORAL_TABLET | Freq: Every day | ORAL | Status: DC
  Administered 2019-10-15 – 2019-11-04 (×21): 1 mg
  Filled 2019-10-15 (×21): qty 1

## 2019-10-15 MED ORDER — MIDAZOLAM 50MG/50ML (1MG/ML) PREMIX INFUSION
0.5000 mg/h | INTRAVENOUS | Status: DC
  Administered 2019-10-15 (×2): 8 mg/h via INTRAVENOUS
  Filled 2019-10-15 (×2): qty 50

## 2019-10-15 MED ORDER — VECURONIUM BROMIDE 10 MG IV SOLR
10.0000 mg | Freq: Once | INTRAVENOUS | Status: DC
  Filled 2019-10-15 (×2): qty 10

## 2019-10-15 MED ORDER — THIAMINE HCL 100 MG/ML IJ SOLN
100.0000 mg | Freq: Every day | INTRAMUSCULAR | Status: DC
  Administered 2019-10-15 – 2019-10-18 (×4): 100 mg via INTRAVENOUS
  Filled 2019-10-15 (×4): qty 2

## 2019-10-15 MED ORDER — FREE WATER
300.0000 mL | Freq: Four times a day (QID) | Status: DC
  Administered 2019-10-15 – 2019-10-28 (×50): 300 mL

## 2019-10-15 MED ORDER — LACTATED RINGERS IV BOLUS
1000.0000 mL | Freq: Once | INTRAVENOUS | Status: AC
  Administered 2019-10-15: 1000 mL via INTRAVENOUS

## 2019-10-15 MED ORDER — ACETAMINOPHEN 325 MG PO TABS
650.0000 mg | ORAL_TABLET | Freq: Four times a day (QID) | ORAL | Status: DC | PRN
  Administered 2019-10-15 – 2019-10-29 (×9): 650 mg
  Filled 2019-10-15 (×10): qty 2

## 2019-10-15 MED ORDER — PANTOPRAZOLE SODIUM 40 MG PO PACK
40.0000 mg | PACK | Freq: Every day | ORAL | Status: DC
  Administered 2019-10-15 – 2019-11-04 (×21): 40 mg
  Filled 2019-10-15 (×21): qty 20

## 2019-10-15 MED ORDER — SODIUM ZIRCONIUM CYCLOSILICATE 10 G PO PACK
10.0000 g | PACK | Freq: Two times a day (BID) | ORAL | Status: DC
  Administered 2019-10-15 – 2019-10-16 (×4): 10 g
  Filled 2019-10-15 (×4): qty 1

## 2019-10-15 NOTE — Procedures (Signed)
Patient Name: Bruce Escobar  MRN: [id number]  Epilepsy Attending: Charlsie Quest  Referring Physician/Provider: Dr. Cheri Fowler Date: 10/15/2019 Duration: 23.48 mins  Patient history: 54 year old male with altered mental status.  EEG telemetry for seizures.  Level of alertness:  comatose  AEDs during EEG study: None  Technical aspects: This EEG study was done with scalp electrodes positioned according to the 10-20 International system of electrode placement. Electrical activity was acquired at a sampling rate of 500Hz  and reviewed with a high frequency filter of 70Hz  and a low frequency filter of 1Hz . EEG data were recorded continuously and digitally stored.   Description: EEG showed continuous generalized 3 to 6 Hz theta-delta slowing.  Hyperventilation and photic stimulation were not performed.     Of note. eeg was technically difficult due to significant ventilator, sweat and electrode artifact.   ABNORMALITY -Continuous slow, generalized  IMPRESSION: This technically difficult study is suggestive of severe diffuse encephalopathy, nonspecific etiology. No seizures or epileptiform discharges were seen throughout the recording.  Bruce Escobar 
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
## 2019-10-15 NOTE — Progress Notes (Signed)
EEG completed, results pending. 

## 2019-10-15 DEATH — deceased

## 2019-10-16 LAB — GLUCOSE, CAPILLARY
Glucose-Capillary: 172 mg/dL — ABNORMAL HIGH (ref 70–99)
Glucose-Capillary: 318 mg/dL — ABNORMAL HIGH (ref 70–99)
Glucose-Capillary: 282 mg/dL — ABNORMAL HIGH (ref 70–99)
Glucose-Capillary: 256 mg/dL — ABNORMAL HIGH (ref 70–99)
Glucose-Capillary: 219 mg/dL — ABNORMAL HIGH (ref 70–99)
Glucose-Capillary: 188 mg/dL — ABNORMAL HIGH (ref 70–99)

## 2019-10-16 LAB — COMPREHENSIVE METABOLIC PANEL
ALT: 1038 U/L — ABNORMAL HIGH (ref 0–44)
AST: 299 U/L — ABNORMAL HIGH (ref 15–41)
Albumin: 1.8 g/dL — ABNORMAL LOW (ref 3.5–5.0)
Alkaline Phosphatase: 90 U/L (ref 38–126)
Anion gap: 8 (ref 5–15)
BUN: 60 mg/dL — ABNORMAL HIGH (ref 6–20)
CO2: 22 mmol/L (ref 22–32)
Calcium: 7.3 mg/dL — ABNORMAL LOW (ref 8.9–10.3)
Chloride: 113 mmol/L — ABNORMAL HIGH (ref 98–111)
Creatinine, Ser: 2.07 mg/dL — ABNORMAL HIGH (ref 0.61–1.24)
GFR calc Af Amer: 41 mL/min — ABNORMAL LOW (ref >60)
GFR calc non Af Amer: 35 mL/min — ABNORMAL LOW (ref >60)
Glucose, Bld: 354 mg/dL — ABNORMAL HIGH (ref 70–99)
Potassium: 5.5 mmol/L — ABNORMAL HIGH (ref 3.5–5.1)
Sodium: 143 mmol/L (ref 135–145)
Total Bilirubin: 0.9 mg/dL (ref 0.3–1.2)
Total Protein: 5.3 g/dL — ABNORMAL LOW (ref 6.5–8.1)

## 2019-10-16 LAB — POCT I-STAT 7, (LYTES, BLD GAS, ICA,H+H)
Acid-base deficit: 4 mmol/L — ABNORMAL HIGH (ref 0.0–2.0)
Bicarbonate: 21.5 mmol/L (ref 20.0–28.0)
Calcium, Ion: 1.14 mmol/L — ABNORMAL LOW (ref 1.15–1.40)
HCT: 37 % — ABNORMAL LOW (ref 39.0–52.0)
Hemoglobin: 12.6 g/dL — ABNORMAL LOW (ref 13.0–17.0)
O2 Saturation: 95 %
Patient temperature: 99.7
Potassium: 5.3 mmol/L — ABNORMAL HIGH (ref 3.5–5.1)
Sodium: 146 mmol/L — ABNORMAL HIGH (ref 135–145)
TCO2: 23 mmol/L (ref 22–32)
pCO2 arterial: 39.1 mmHg (ref 32.0–48.0)
pH, Arterial: 7.35 (ref 7.350–7.450)
pO2, Arterial: 81 mmHg — ABNORMAL LOW (ref 83.0–108.0)

## 2019-10-16 LAB — CBC WITH DIFFERENTIAL/PLATELET
Abs Immature Granulocytes: 0.31 10*3/uL — ABNORMAL HIGH (ref 0.00–0.07)
Basophils Absolute: 0 10*3/uL (ref 0.0–0.1)
Basophils Relative: 0 %
Eosinophils Absolute: 0 10*3/uL (ref 0.0–0.5)
Eosinophils Relative: 0 %
HCT: 39.8 % (ref 39.0–52.0)
Hemoglobin: 12.4 g/dL — ABNORMAL LOW (ref 13.0–17.0)
Immature Granulocytes: 1 %
Lymphocytes Relative: 3 %
Lymphs Abs: 0.6 10*3/uL — ABNORMAL LOW (ref 0.7–4.0)
MCH: 31.1 pg (ref 26.0–34.0)
MCHC: 31.2 g/dL (ref 30.0–36.0)
MCV: 99.7 fL (ref 80.0–100.0)
Monocytes Absolute: 0.8 10*3/uL (ref 0.1–1.0)
Monocytes Relative: 3 %
Neutro Abs: 20.2 10*3/uL — ABNORMAL HIGH (ref 1.7–7.7)
Neutrophils Relative %: 93 %
Platelets: 127 10*3/uL — ABNORMAL LOW (ref 150–400)
RBC: 3.99 MIL/uL — ABNORMAL LOW (ref 4.22–5.81)
RDW: 14.1 % (ref 11.5–15.5)
WBC: 21.9 10*3/uL — ABNORMAL HIGH (ref 4.0–10.5)
nRBC: 0.1 % (ref 0.0–0.2)

## 2019-10-16 LAB — MAGNESIUM: Magnesium: 2.9 mg/dL — ABNORMAL HIGH (ref 1.7–2.4)

## 2019-10-16 LAB — RAPID URINE DRUG SCREEN, HOSP PERFORMED
Amphetamines: NOT DETECTED
Barbiturates: NOT DETECTED
Benzodiazepines: POSITIVE — AB
Cocaine: NOT DETECTED
Opiates: NOT DETECTED
Tetrahydrocannabinol: NOT DETECTED

## 2019-10-16 LAB — PHOSPHORUS: Phosphorus: 3.4 mg/dL (ref 2.5–4.6)

## 2019-10-16 MED ORDER — LORAZEPAM 2 MG/ML IJ SOLN
INTRAMUSCULAR | Status: AC
  Administered 2019-10-16: 2 mg via INTRAVENOUS
  Filled 2019-10-16: qty 1

## 2019-10-16 MED ORDER — METHYLPREDNISOLONE SODIUM SUCC 40 MG IJ SOLR
40.0000 mg | Freq: Every day | INTRAMUSCULAR | Status: AC
  Administered 2019-10-16 – 2019-10-20 (×5): 40 mg via INTRAVENOUS
  Filled 2019-10-16 (×5): qty 1

## 2019-10-16 MED ORDER — AMIODARONE HCL IN DEXTROSE 360-4.14 MG/200ML-% IV SOLN
60.0000 mg/h | INTRAVENOUS | Status: AC
  Administered 2019-10-16 (×2): 60 mg/h via INTRAVENOUS
  Filled 2019-10-16: qty 200

## 2019-10-16 MED ORDER — INSULIN GLARGINE 100 UNIT/ML ~~LOC~~ SOLN
10.0000 [IU] | Freq: Two times a day (BID) | SUBCUTANEOUS | Status: DC
  Administered 2019-10-16 (×2): 10 [IU] via SUBCUTANEOUS
  Filled 2019-10-16 (×4): qty 0.1

## 2019-10-16 MED ORDER — PROPOFOL 1000 MG/100ML IV EMUL
25.0000 ug/kg/min | INTRAVENOUS | Status: DC
  Administered 2019-10-16: 25 ug/kg/min via INTRAVENOUS
  Administered 2019-10-17: 55 ug/kg/min via INTRAVENOUS
  Administered 2019-10-17: 35 ug/kg/min via INTRAVENOUS
  Administered 2019-10-17 (×3): 55 ug/kg/min via INTRAVENOUS
  Administered 2019-10-17: 25 ug/kg/min via INTRAVENOUS
  Administered 2019-10-18 (×2): 50 ug/kg/min via INTRAVENOUS
  Administered 2019-10-18: 60 ug/kg/min via INTRAVENOUS
  Administered 2019-10-18 (×2): 40 ug/kg/min via INTRAVENOUS
  Administered 2019-10-18: 55 ug/kg/min via INTRAVENOUS
  Administered 2019-10-18: 50 ug/kg/min via INTRAVENOUS
  Administered 2019-10-18: 60 ug/kg/min via INTRAVENOUS
  Administered 2019-10-19: 50 ug/kg/min via INTRAVENOUS
  Administered 2019-10-19 (×2): 40 ug/kg/min via INTRAVENOUS
  Administered 2019-10-19: 50 ug/kg/min via INTRAVENOUS
  Administered 2019-10-19 – 2019-10-20 (×3): 40 ug/kg/min via INTRAVENOUS
  Administered 2019-10-20: 30 ug/kg/min via INTRAVENOUS
  Administered 2019-10-20: 20 ug/kg/min via INTRAVENOUS
  Administered 2019-10-20: 30 ug/kg/min via INTRAVENOUS
  Administered 2019-10-21: 40 ug/kg/min via INTRAVENOUS
  Administered 2019-10-21 (×3): 30 ug/kg/min via INTRAVENOUS
  Administered 2019-10-22 (×2): 40 ug/kg/min via INTRAVENOUS
  Filled 2019-10-16 (×9): qty 100
  Filled 2019-10-16: qty 200
  Filled 2019-10-16 (×5): qty 100
  Filled 2019-10-16 (×2): qty 200
  Filled 2019-10-16 (×10): qty 100

## 2019-10-16 MED ORDER — SODIUM CHLORIDE 0.9% FLUSH
10.0000 mL | Freq: Two times a day (BID) | INTRAVENOUS | Status: DC
  Administered 2019-10-16 – 2019-10-17 (×4): 10 mL
  Administered 2019-10-17: 20 mL
  Administered 2019-10-18 – 2019-10-25 (×12): 10 mL
  Administered 2019-10-25: 30 mL
  Administered 2019-10-26 – 2019-10-31 (×9): 10 mL
  Administered 2019-10-31: 40 mL
  Administered 2019-11-01 – 2019-11-03 (×5): 10 mL

## 2019-10-16 MED ORDER — SODIUM CHLORIDE 0.9% FLUSH: 10.0000 mL | INTRAVENOUS | Status: DC | PRN

## 2019-10-16 MED ORDER — LACTATED RINGERS IV BOLUS
1000.0000 mL | Freq: Once | INTRAVENOUS | Status: AC
  Administered 2019-10-16: 1000 mL via INTRAVENOUS

## 2019-10-16 MED ORDER — LACTATED RINGERS IV SOLN: INTRAVENOUS | Status: DC

## 2019-10-16 MED ORDER — METHADONE HCL 5 MG PO TABS
10.0000 mg | ORAL_TABLET | Freq: Three times a day (TID) | ORAL | Status: DC
  Administered 2019-10-16 – 2019-10-18 (×7): 10 mg
  Filled 2019-10-16 (×7): qty 2

## 2019-10-16 MED ORDER — CISATRACURIUM BESYLATE 20 MG/10ML IV SOLN
0.1000 mg/kg | INTRAVENOUS | Status: DC | PRN
  Filled 2019-10-16: qty 10

## 2019-10-16 MED ORDER — AMIODARONE HCL IN DEXTROSE 360-4.14 MG/200ML-% IV SOLN
30.0000 mg/h | INTRAVENOUS | Status: DC
  Administered 2019-10-17 – 2019-10-22 (×12): 30 mg/h via INTRAVENOUS
  Filled 2019-10-16 (×12): qty 200

## 2019-10-16 MED ORDER — AMIODARONE LOAD VIA INFUSION
150.0000 mg | Freq: Once | INTRAVENOUS | Status: AC
  Administered 2019-10-16: 150 mg via INTRAVENOUS

## 2019-10-16 MED ORDER — AMIODARONE HCL IN DEXTROSE 360-4.14 MG/200ML-% IV SOLN
INTRAVENOUS | Status: AC
  Filled 2019-10-16: qty 200

## 2019-10-16 MED ORDER — ARTIFICIAL TEARS OPHTHALMIC OINT: 1.0000 "application " | TOPICAL_OINTMENT | Freq: Three times a day (TID) | OPHTHALMIC | Status: DC

## 2019-10-16 MED ORDER — LORAZEPAM 2 MG/ML IJ SOLN
2.0000 mg | Freq: Once | INTRAMUSCULAR | Status: AC
  Administered 2019-10-16: 2 mg via INTRAVENOUS
  Filled 2019-10-16: qty 1

## 2019-10-16 NOTE — Progress Notes (Signed)
45 mls of Versed wasted and witnessed with Casimiro Needle, RN

## 2019-10-16 NOTE — Progress Notes (Signed)
Pt with significant vent dyssynchrony despite maxed dose of continuous sedation of Fent and Precedex and PRN medication. Contacted [facility] and requesting MD to camera into room to assess patient. New orders received. Precedex stopped and Propofol started and titrated for RASS of -4. Pt slowly becoming more synchronous with vent with deeper sedation.

## 2019-10-16 NOTE — Progress Notes (Signed)
eLink Physician-Brief Progress Note Patient Name: [name] DOB: 17-Jul-1965 MRN: [id number]   Date of Service  10/16/2019  HPI/Events of Note  Patient has severe ventilator dyssynchrony. He is being ineffectively ventilated as a result. He is on fentanyl drip with boluses PRN, versed pushes PRN, and Precedex drip. Currently RASS -2 to -3.    eICU Interventions  Start propofol infusion (and continue fentanyl infusion) for goal RASS -3 to -4.  If ventilator synchrony does not improve by RASS -4, will use intermittent paralytics (ordered) to achieve synchrony to minimize risk of VILI.     Intervention Category Major Interventions: Respiratory failure - evaluation and management  Marveen Reeks Zetta Stoneman 10/16/2019, 10:12 PM

## 2019-10-16 NOTE — Progress Notes (Signed)
eLink Physician-Brief Progress Note Patient Name: Bruce Escobar DOB: 02-08-1965 MRN: [id number]   Date of Service  10/16/2019  HPI/Events of Note  RN requests bilateral soft wrist restraints for agitation to maintain patient safety.   eICU Interventions  Restraints order entered.     Intervention Category Minor Interventions: Other:  Janae Bridgeman 10/16/2019, 3:16 AM
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
## 2019-10-17 ENCOUNTER — Inpatient Hospital Stay (HOSPITAL_COMMUNITY): Payer: Medicaid Other

## 2019-10-17 DIAGNOSIS — U071 COVID-19: Secondary | ICD-10-CM

## 2019-10-17 DIAGNOSIS — J9601 Acute respiratory failure with hypoxia: Secondary | ICD-10-CM

## 2019-10-17 DIAGNOSIS — I361 Nonrheumatic tricuspid (valve) insufficiency: Secondary | ICD-10-CM | POA: Diagnosis not present

## 2019-10-17 LAB — COMPREHENSIVE METABOLIC PANEL
ALT: 653 U/L — ABNORMAL HIGH (ref 0–44)
AST: 112 U/L — ABNORMAL HIGH (ref 15–41)
Albumin: 1.8 g/dL — ABNORMAL LOW (ref 3.5–5.0)
Alkaline Phosphatase: 102 U/L (ref 38–126)
Anion gap: 8 (ref 5–15)
BUN: 58 mg/dL — ABNORMAL HIGH (ref 6–20)
CO2: 23 mmol/L (ref 22–32)
Calcium: 7.6 mg/dL — ABNORMAL LOW (ref 8.9–10.3)
Chloride: 114 mmol/L — ABNORMAL HIGH (ref 98–111)
Creatinine, Ser: 1.87 mg/dL — ABNORMAL HIGH (ref 0.61–1.24)
GFR calc Af Amer: 47 mL/min — ABNORMAL LOW (ref >60)
GFR calc non Af Amer: 40 mL/min — ABNORMAL LOW (ref >60)
Glucose, Bld: 289 mg/dL — ABNORMAL HIGH (ref 70–99)
Potassium: 5.6 mmol/L — ABNORMAL HIGH (ref 3.5–5.1)
Sodium: 145 mmol/L (ref 135–145)
Total Bilirubin: 1.1 mg/dL (ref 0.3–1.2)
Total Protein: 5 g/dL — ABNORMAL LOW (ref 6.5–8.1)

## 2019-10-17 LAB — CBC WITH DIFFERENTIAL/PLATELET
Abs Immature Granulocytes: 0.55 10*3/uL — ABNORMAL HIGH (ref 0.00–0.07)
Basophils Absolute: 0.1 10*3/uL (ref 0.0–0.1)
Basophils Relative: 0 %
Eosinophils Absolute: 0 10*3/uL (ref 0.0–0.5)
Eosinophils Relative: 0 %
HCT: 41.4 % (ref 39.0–52.0)
Hemoglobin: 12.8 g/dL — ABNORMAL LOW (ref 13.0–17.0)
Immature Granulocytes: 2 %
Lymphocytes Relative: 2 %
Lymphs Abs: 0.5 10*3/uL — ABNORMAL LOW (ref 0.7–4.0)
MCH: 31.1 pg (ref 26.0–34.0)
MCHC: 30.9 g/dL (ref 30.0–36.0)
MCV: 100.5 fL — ABNORMAL HIGH (ref 80.0–100.0)
Monocytes Absolute: 2 10*3/uL — ABNORMAL HIGH (ref 0.1–1.0)
Monocytes Relative: 7 %
Neutro Abs: 25.5 10*3/uL — ABNORMAL HIGH (ref 1.7–7.7)
Neutrophils Relative %: 89 %
Platelets: 115 10*3/uL — ABNORMAL LOW (ref 150–400)
RBC: 4.12 MIL/uL — ABNORMAL LOW (ref 4.22–5.81)
RDW: 14.3 % (ref 11.5–15.5)
WBC: 28.7 10*3/uL — ABNORMAL HIGH (ref 4.0–10.5)
nRBC: 0.1 % (ref 0.0–0.2)

## 2019-10-17 LAB — TRIGLYCERIDES: Triglycerides: 331 mg/dL — ABNORMAL HIGH (ref <150)

## 2019-10-17 LAB — POCT I-STAT 7, (LYTES, BLD GAS, ICA,H+H)
Acid-base deficit: 2 mmol/L (ref 0.0–2.0)
Bicarbonate: 25.5 mmol/L (ref 20.0–28.0)
Calcium, Ion: 1.18 mmol/L (ref 1.15–1.40)
HCT: 38 % — ABNORMAL LOW (ref 39.0–52.0)
Hemoglobin: 12.9 g/dL — ABNORMAL LOW (ref 13.0–17.0)
O2 Saturation: 95 %
Potassium: 5.8 mmol/L — ABNORMAL HIGH (ref 3.5–5.1)
Sodium: 144 mmol/L (ref 135–145)
TCO2: 27 mmol/L (ref 22–32)
pCO2 arterial: 56.9 mmHg — ABNORMAL HIGH (ref 32.0–48.0)
pH, Arterial: 7.259 — ABNORMAL LOW (ref 7.350–7.450)
pO2, Arterial: 92 mmHg (ref 83.0–108.0)

## 2019-10-17 LAB — HEPATITIS PANEL, ACUTE
HCV Ab: NONREACTIVE
Hep A IgM: NONREACTIVE
Hep B C IgM: NONREACTIVE
Hepatitis B Surface Ag: NONREACTIVE

## 2019-10-17 LAB — GLUCOSE, CAPILLARY
Glucose-Capillary: 215 mg/dL — ABNORMAL HIGH (ref 70–99)
Glucose-Capillary: 217 mg/dL — ABNORMAL HIGH (ref 70–99)
Glucose-Capillary: 221 mg/dL — ABNORMAL HIGH (ref 70–99)
Glucose-Capillary: 229 mg/dL — ABNORMAL HIGH (ref 70–99)
Glucose-Capillary: 230 mg/dL — ABNORMAL HIGH (ref 70–99)
Glucose-Capillary: 250 mg/dL — ABNORMAL HIGH (ref 70–99)
Glucose-Capillary: 260 mg/dL — ABNORMAL HIGH (ref 70–99)

## 2019-10-17 LAB — CULTURE, RESPIRATORY W GRAM STAIN: Culture: NORMAL

## 2019-10-17 LAB — ECHOCARDIOGRAM LIMITED
Height: 71 in
S' Lateral: 2.5 cm
Weight: 3252.23 oz

## 2019-10-17 MED ORDER — INSULIN GLARGINE 100 UNIT/ML ~~LOC~~ SOLN
15.0000 [IU] | Freq: Two times a day (BID) | SUBCUTANEOUS | Status: DC
  Administered 2019-10-17 (×2): 15 [IU] via SUBCUTANEOUS
  Filled 2019-10-17 (×4): qty 0.15

## 2019-10-17 MED ORDER — SODIUM ZIRCONIUM CYCLOSILICATE 10 G PO PACK
10.0000 g | PACK | Freq: Three times a day (TID) | ORAL | Status: AC
  Administered 2019-10-17 – 2019-10-18 (×6): 10 g
  Filled 2019-10-17 (×6): qty 1

## 2019-10-17 NOTE — Progress Notes (Signed)
eLink Physician-Brief Progress Note Patient Name: Bruce Escobar DOB: June 22, 1965 MRN: [id number]   Date of Service  10/17/2019  HPI/Events of Note  Hyperkalemia to 5.6. On Lokelma 10mg  BID at present. Cr is slowly improving.   eICU Interventions  Increase Lokelma to 10mg  TID x48 hours.     Intervention Category Major Interventions: Electrolyte abnormality - evaluation and management  Myalee Stengel 10/17/2019, 4:53 AM
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
## 2019-10-17 NOTE — Progress Notes (Signed)
  Echocardiogram 2D Echocardiogram has been performed.  Bruce Escobar 10/17/2019, 2:04 PM

## 2019-10-18 ENCOUNTER — Inpatient Hospital Stay (HOSPITAL_COMMUNITY): Payer: Medicaid Other

## 2019-10-18 DIAGNOSIS — U071 COVID-19: Secondary | ICD-10-CM | POA: Diagnosis not present

## 2019-10-18 DIAGNOSIS — J80 Acute respiratory distress syndrome: Secondary | ICD-10-CM | POA: Diagnosis not present

## 2019-10-18 DIAGNOSIS — J9601 Acute respiratory failure with hypoxia: Secondary | ICD-10-CM | POA: Diagnosis not present

## 2019-10-18 LAB — DIC (DISSEMINATED INTRAVASCULAR COAGULATION)PANEL
D-Dimer, Quant: >20 ug/mL-FEU — ABNORMAL HIGH (ref 0.00–0.50)
Fibrinogen: 196 mg/dL — ABNORMAL LOW (ref 210–475)
INR: 1.3 — ABNORMAL HIGH (ref 0.8–1.2)
Platelets: UNDETERMINED 10*3/uL (ref 150–400)
Prothrombin Time: 16.1 seconds — ABNORMAL HIGH (ref 11.4–15.2)
Smear Review: NONE SEEN
aPTT: 24 seconds (ref 24–36)

## 2019-10-18 LAB — CULTURE, BLOOD (ROUTINE X 2)
Culture: NO GROWTH
Culture: NO GROWTH
Special Requests: ADEQUATE
Special Requests: ADEQUATE

## 2019-10-18 LAB — GLOBAL TEG PANEL
CFF Max Amplitude: 12.8 mm — ABNORMAL LOW (ref 15–32)
CK with Heparinase (R): 5 min (ref 4.3–8.3)
Citrated Functional Fibrinogen: 233.6 mg/dL — ABNORMAL LOW (ref 278–581)
Citrated Kaolin (K): 2.5 min — ABNORMAL HIGH (ref 0.8–2.1)
Citrated Kaolin (MA): 49.2 mm — ABNORMAL LOW (ref 52–69)
Citrated Kaolin (R): 4.6 min (ref 4.6–9.1)
Citrated Kaolin Angle: 66 deg (ref 63–78)
Citrated Rapid TEG (MA): 46 mm — ABNORMAL LOW (ref 52–70)

## 2019-10-18 LAB — TRIGLYCERIDES: Triglycerides: 271 mg/dL — ABNORMAL HIGH (ref <150)

## 2019-10-18 LAB — COMPREHENSIVE METABOLIC PANEL
ALT: 381 U/L — ABNORMAL HIGH (ref 0–44)
AST: 47 U/L — ABNORMAL HIGH (ref 15–41)
Albumin: 1.6 g/dL — ABNORMAL LOW (ref 3.5–5.0)
Alkaline Phosphatase: 104 U/L (ref 38–126)
Anion gap: 5 (ref 5–15)
BUN: 47 mg/dL — ABNORMAL HIGH (ref 6–20)
CO2: 26 mmol/L (ref 22–32)
Calcium: 7.7 mg/dL — ABNORMAL LOW (ref 8.9–10.3)
Chloride: 112 mmol/L — ABNORMAL HIGH (ref 98–111)
Creatinine, Ser: 1.61 mg/dL — ABNORMAL HIGH (ref 0.61–1.24)
GFR calc Af Amer: 56 mL/min — ABNORMAL LOW (ref >60)
GFR calc non Af Amer: 48 mL/min — ABNORMAL LOW (ref >60)
Glucose, Bld: 251 mg/dL — ABNORMAL HIGH (ref 70–99)
Potassium: 5.1 mmol/L (ref 3.5–5.1)
Sodium: 143 mmol/L (ref 135–145)
Total Bilirubin: 0.8 mg/dL (ref 0.3–1.2)
Total Protein: 4.9 g/dL — ABNORMAL LOW (ref 6.5–8.1)

## 2019-10-18 LAB — GLUCOSE, CAPILLARY
Glucose-Capillary: 165 mg/dL — ABNORMAL HIGH (ref 70–99)
Glucose-Capillary: 171 mg/dL — ABNORMAL HIGH (ref 70–99)
Glucose-Capillary: 171 mg/dL — ABNORMAL HIGH (ref 70–99)
Glucose-Capillary: 174 mg/dL — ABNORMAL HIGH (ref 70–99)
Glucose-Capillary: 217 mg/dL — ABNORMAL HIGH (ref 70–99)
Glucose-Capillary: 222 mg/dL — ABNORMAL HIGH (ref 70–99)

## 2019-10-18 LAB — POCT I-STAT 7, (LYTES, BLD GAS, ICA,H+H)
Acid-Base Excess: 0 mmol/L (ref 0.0–2.0)
Bicarbonate: 27.7 mmol/L (ref 20.0–28.0)
Calcium, Ion: 1.22 mmol/L (ref 1.15–1.40)
HCT: 36 % — ABNORMAL LOW (ref 39.0–52.0)
Hemoglobin: 12.2 g/dL — ABNORMAL LOW (ref 13.0–17.0)
O2 Saturation: 94 %
Patient temperature: 98
Potassium: 5.1 mmol/L (ref 3.5–5.1)
Sodium: 143 mmol/L (ref 135–145)
TCO2: 29 mmol/L (ref 22–32)
pCO2 arterial: 58.6 mmHg — ABNORMAL HIGH (ref 32.0–48.0)
pH, Arterial: 7.28 — ABNORMAL LOW (ref 7.350–7.450)
pO2, Arterial: 82 mmHg — ABNORMAL LOW (ref 83.0–108.0)

## 2019-10-18 LAB — CBC WITH DIFFERENTIAL/PLATELET
Abs Immature Granulocytes: 0.26 10*3/uL — ABNORMAL HIGH (ref 0.00–0.07)
Basophils Absolute: 0 10*3/uL (ref 0.0–0.1)
Basophils Relative: 0 %
Eosinophils Absolute: 0 10*3/uL (ref 0.0–0.5)
Eosinophils Relative: 0 %
HCT: 41 % (ref 39.0–52.0)
Hemoglobin: 12.6 g/dL — ABNORMAL LOW (ref 13.0–17.0)
Immature Granulocytes: 1 %
Lymphocytes Relative: 2 %
Lymphs Abs: 0.3 10*3/uL — ABNORMAL LOW (ref 0.7–4.0)
MCH: 31.8 pg (ref 26.0–34.0)
MCHC: 30.7 g/dL (ref 30.0–36.0)
MCV: 103.5 fL — ABNORMAL HIGH (ref 80.0–100.0)
Monocytes Absolute: 1.5 10*3/uL — ABNORMAL HIGH (ref 0.1–1.0)
Monocytes Relative: 7 %
Neutro Abs: 20.5 10*3/uL — ABNORMAL HIGH (ref 1.7–7.7)
Neutrophils Relative %: 90 %
Platelets: 70 10*3/uL — ABNORMAL LOW (ref 150–400)
RBC: 3.96 MIL/uL — ABNORMAL LOW (ref 4.22–5.81)
RDW: 14.2 % (ref 11.5–15.5)
WBC: 22.6 10*3/uL — ABNORMAL HIGH (ref 4.0–10.5)
nRBC: 0.1 % (ref 0.0–0.2)

## 2019-10-18 MED ORDER — DOXAZOSIN MESYLATE 2 MG PO TABS
2.0000 mg | ORAL_TABLET | Freq: Every day | ORAL | Status: DC
  Administered 2019-10-18 – 2019-10-19 (×2): 2 mg via ORAL
  Filled 2019-10-18 (×2): qty 1

## 2019-10-18 MED ORDER — OXYCODONE HCL 5 MG PO TABS
5.0000 mg | ORAL_TABLET | ORAL | Status: DC
  Administered 2019-10-18 – 2019-10-19 (×7): 5 mg via ORAL
  Filled 2019-10-18 (×7): qty 1

## 2019-10-18 MED ORDER — CLONAZEPAM 1 MG PO TABS
1.0000 mg | ORAL_TABLET | Freq: Three times a day (TID) | ORAL | Status: DC
  Administered 2019-10-18 – 2019-10-22 (×14): 1 mg
  Filled 2019-10-18 (×14): qty 1

## 2019-10-18 MED ORDER — INSULIN ASPART 100 UNIT/ML ~~LOC~~ SOLN
4.0000 [IU] | SUBCUTANEOUS | Status: DC
  Administered 2019-10-18 – 2019-11-04 (×84): 4 [IU] via SUBCUTANEOUS

## 2019-10-18 MED ORDER — METOLAZONE 5 MG PO TABS
5.0000 mg | ORAL_TABLET | Freq: Once | ORAL | Status: AC
  Administered 2019-10-18: 5 mg via ORAL
  Filled 2019-10-18: qty 1

## 2019-10-18 MED ORDER — CLONAZEPAM 1 MG PO TABS
1.0000 mg | ORAL_TABLET | Freq: Three times a day (TID) | ORAL | Status: DC
  Administered 2019-10-18: 1 mg via ORAL
  Filled 2019-10-18: qty 1

## 2019-10-18 MED ORDER — BETHANECHOL CHLORIDE 10 MG PO TABS
10.0000 mg | ORAL_TABLET | Freq: Three times a day (TID) | ORAL | Status: DC
  Administered 2019-10-18 – 2019-10-19 (×3): 10 mg via ORAL
  Filled 2019-10-18 (×3): qty 1

## 2019-10-18 MED ORDER — ENOXAPARIN SODIUM 100 MG/ML ~~LOC~~ SOLN
1.0000 mg/kg | Freq: Two times a day (BID) | SUBCUTANEOUS | Status: DC
  Administered 2019-10-18 – 2019-10-21 (×7): 95 mg via SUBCUTANEOUS
  Filled 2019-10-18 (×7): qty 0.95

## 2019-10-18 MED ORDER — THIAMINE HCL 100 MG PO TABS
100.0000 mg | ORAL_TABLET | Freq: Every day | ORAL | Status: DC
  Administered 2019-10-19 – 2019-11-04 (×17): 100 mg
  Filled 2019-10-18 (×17): qty 1

## 2019-10-18 MED ORDER — GABAPENTIN 250 MG/5ML PO SOLN
300.0000 mg | Freq: Every day | ORAL | Status: DC
  Administered 2019-10-18 – 2019-10-21 (×4): 300 mg
  Filled 2019-10-18 (×4): qty 6

## 2019-10-18 MED ORDER — FUROSEMIDE 10 MG/ML IJ SOLN
40.0000 mg | Freq: Four times a day (QID) | INTRAMUSCULAR | Status: AC
  Administered 2019-10-18 (×2): 40 mg via INTRAVENOUS
  Filled 2019-10-18 (×2): qty 4

## 2019-10-18 MED ORDER — INSULIN GLARGINE 100 UNIT/ML ~~LOC~~ SOLN
25.0000 [IU] | Freq: Two times a day (BID) | SUBCUTANEOUS | Status: DC
  Administered 2019-10-18 – 2019-10-19 (×4): 25 [IU] via SUBCUTANEOUS
  Filled 2019-10-18 (×6): qty 0.25

## 2019-10-18 MED ORDER — GABAPENTIN 300 MG PO CAPS: 300.0000 mg | ORAL_CAPSULE | Freq: Every day | ORAL | Status: DC

## 2019-10-18 MED ORDER — ACETAZOLAMIDE 250 MG PO TABS
500.0000 mg | ORAL_TABLET | Freq: Once | ORAL | Status: DC
  Filled 2019-10-18: qty 2

## 2019-10-18 NOTE — Procedures (Signed)
Cortrak  Person Inserting Tube:  Renie Ora, RD Tube Type:  Cortrak - 43 inches Tube Location:  Right nare Initial Placement:  Stomach Secured by: Bridle Technique Used to Measure Tube Placement:  Documented cm marking at nare/ corner of mouth Cortrak Secured At:  75 cm Procedure Comments:  Cortrak Tube Team Note:  Consult received to place a Cortrak feeding tube.   No x-ray is required. RN may begin using tube.    If the tube becomes dislodged please keep the tube and contact the Cortrak team at [email or web address] (password [email or web address]) for replacement.  If after hours and replacement cannot be delayed, place a NG tube and confirm placement with an abdominal x-ray.      Trenton Gammon, MS, RD, LDN, CNSC Inpatient Clinical Dietitian RD pager # available in AMION  After hours/weekend pager # available in [facility]
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
## 2019-10-19 ENCOUNTER — Inpatient Hospital Stay (HOSPITAL_COMMUNITY): Payer: Medicaid Other

## 2019-10-19 DIAGNOSIS — U071 COVID-19: Secondary | ICD-10-CM | POA: Diagnosis not present

## 2019-10-19 DIAGNOSIS — J80 Acute respiratory distress syndrome: Secondary | ICD-10-CM | POA: Diagnosis not present

## 2019-10-19 DIAGNOSIS — J9601 Acute respiratory failure with hypoxia: Secondary | ICD-10-CM | POA: Diagnosis not present

## 2019-10-19 LAB — BASIC METABOLIC PANEL
Anion gap: 11 (ref 5–15)
BUN: 46 mg/dL — ABNORMAL HIGH (ref 6–20)
CO2: 32 mmol/L (ref 22–32)
Calcium: 7.6 mg/dL — ABNORMAL LOW (ref 8.9–10.3)
Chloride: 102 mmol/L (ref 98–111)
Creatinine, Ser: 1.69 mg/dL — ABNORMAL HIGH (ref 0.61–1.24)
GFR calc Af Amer: 53 mL/min — ABNORMAL LOW (ref >60)
GFR calc non Af Amer: 45 mL/min — ABNORMAL LOW (ref >60)
Glucose, Bld: 164 mg/dL — ABNORMAL HIGH (ref 70–99)
Potassium: 4 mmol/L (ref 3.5–5.1)
Sodium: 145 mmol/L (ref 135–145)

## 2019-10-19 LAB — GLUCOSE, CAPILLARY
Glucose-Capillary: 118 mg/dL — ABNORMAL HIGH (ref 70–99)
Glucose-Capillary: 173 mg/dL — ABNORMAL HIGH (ref 70–99)
Glucose-Capillary: 205 mg/dL — ABNORMAL HIGH (ref 70–99)
Glucose-Capillary: 214 mg/dL — ABNORMAL HIGH (ref 70–99)
Glucose-Capillary: 226 mg/dL — ABNORMAL HIGH (ref 70–99)
Glucose-Capillary: 247 mg/dL — ABNORMAL HIGH (ref 70–99)

## 2019-10-19 LAB — TRIGLYCERIDES: Triglycerides: 187 mg/dL — ABNORMAL HIGH (ref <150)

## 2019-10-19 LAB — CBC
HCT: 39.7 % (ref 39.0–52.0)
Hemoglobin: 12.3 g/dL — ABNORMAL LOW (ref 13.0–17.0)
MCH: 30.7 pg (ref 26.0–34.0)
MCHC: 31 g/dL (ref 30.0–36.0)
MCV: 99 fL (ref 80.0–100.0)
Platelets: 72 10*3/uL — ABNORMAL LOW (ref 150–400)
RBC: 4.01 MIL/uL — ABNORMAL LOW (ref 4.22–5.81)
RDW: 13.8 % (ref 11.5–15.5)
WBC: 19 10*3/uL — ABNORMAL HIGH (ref 4.0–10.5)
nRBC: 0.3 % — ABNORMAL HIGH (ref 0.0–0.2)

## 2019-10-19 LAB — MAGNESIUM: Magnesium: 2.2 mg/dL (ref 1.7–2.4)

## 2019-10-19 LAB — PHOSPHORUS: Phosphorus: 4.3 mg/dL (ref 2.5–4.6)

## 2019-10-19 MED ORDER — FENTANYL CITRATE (PF) 100 MCG/2ML IJ SOLN
INTRAMUSCULAR | Status: AC
  Filled 2019-10-19: qty 2

## 2019-10-19 MED ORDER — BETHANECHOL CHLORIDE 10 MG PO TABS
10.0000 mg | ORAL_TABLET | Freq: Three times a day (TID) | ORAL | Status: DC
  Administered 2019-10-19 – 2019-10-23 (×12): 10 mg
  Filled 2019-10-19 (×12): qty 1

## 2019-10-19 MED ORDER — ETOMIDATE 2 MG/ML IV SOLN
INTRAVENOUS | Status: AC
  Filled 2019-10-19: qty 20

## 2019-10-19 MED ORDER — ROCURONIUM BROMIDE 10 MG/ML (PF) SYRINGE
PREFILLED_SYRINGE | INTRAVENOUS | Status: AC
  Administered 2019-10-19: 50 mg
  Filled 2019-10-19: qty 10

## 2019-10-19 MED ORDER — METOLAZONE 5 MG PO TABS
5.0000 mg | ORAL_TABLET | Freq: Once | ORAL | Status: AC
  Administered 2019-10-19: 5 mg via ORAL
  Filled 2019-10-19: qty 1

## 2019-10-19 MED ORDER — DOXAZOSIN MESYLATE 2 MG PO TABS
2.0000 mg | ORAL_TABLET | Freq: Every day | ORAL | Status: DC
  Administered 2019-10-20 – 2019-10-23 (×4): 2 mg
  Filled 2019-10-19 (×4): qty 1

## 2019-10-19 MED ORDER — MIDAZOLAM HCL 2 MG/2ML IJ SOLN
INTRAMUSCULAR | Status: AC
  Filled 2019-10-19: qty 4

## 2019-10-19 MED ORDER — FUROSEMIDE 10 MG/ML IJ SOLN
40.0000 mg | Freq: Four times a day (QID) | INTRAMUSCULAR | Status: AC
  Administered 2019-10-19 (×2): 40 mg via INTRAVENOUS
  Filled 2019-10-19 (×2): qty 4

## 2019-10-19 MED ORDER — ROCURONIUM BROMIDE 50 MG/5ML IV SOLN
50.0000 mg | Freq: Once | INTRAVENOUS | Status: AC
  Administered 2019-10-19: 50 mg via INTRAVENOUS
  Filled 2019-10-19: qty 5

## 2019-10-19 MED ORDER — OXYCODONE HCL 5 MG PO TABS
5.0000 mg | ORAL_TABLET | ORAL | Status: DC
  Administered 2019-10-19 – 2019-10-22 (×18): 5 mg
  Filled 2019-10-19 (×18): qty 1

## 2019-10-19 NOTE — Progress Notes (Signed)
eLink Physician-Brief Progress Note Patient Name: Bruce Escobar DOB: [birth year] MRN: [id number]   Date of Service  10/19/2019  HPI/Events of Note  Frequent loose stools - Request for Flexiseal.   eICU Interventions  Place Flexiseal.      Intervention Category Major Interventions: Other:  Tonatiuh Mallon Dennard Nip 10/19/2019, 1:23 AM

## 2019-10-19 NOTE — Progress Notes (Addendum)
Updated mother Enid Derry at [phone]

## 2019-10-19 NOTE — Procedures (Signed)
Intubation Procedure Note  ZADYN YARDLEY  [id number]  [birth year]  Date:10/19/19  Time:11:22 AM   Provider Performing:Jacarri Gesner C Katrinka Blazing    Procedure: Intubation (31500)  Indication(s) Respiratory Failure  Consent Unable to obtain consent due to emergent nature of procedure.   Anesthesia Fentanyl, Rocuronium and Propofol   Time Out Verified patient identification, verified procedure, site/side was marked, verified correct patient position, special equipment/implants available, medications/allergies/relevant history reviewed, required imaging and test results available.   Sterile Technique Usual hand hygeine, masks, and gloves were used   Procedure Description Patient positioned in bed supine.  Sedation given as noted above.  Patient was intubated with endotracheal tube using bougee advanced down existing ETT.   Number of attempts was 1.  Colorimetric CO2 detector was consistent with tracheal placement.   Complications/Tolerance None; patient tolerated the procedure well. Chest X-ray is ordered to verify placement.   EBL Minimal   Specimen(s) None

## 2019-10-19 NOTE — Progress Notes (Signed)
Called for desats, worsening breath sounds on left. CXR showing new subQ air on L without PTX ETT remained remarkably high despite advacement yesterday and today. Bronch and ETT exchange performed with findings documented in respective notes. Patient still remains too hypoxemic and encephalopathic for extubation.  Myrla Halsted MD PCCM

## 2019-10-19 NOTE — Procedures (Signed)
Bronchoscopy Procedure Note  ACY ORSAK  [id number]  Dec 14, 1965  Date:10/19/19  Time:11:19 AM   Provider Performing:Vienna Folden C Tamala Julian   Procedure(s):  Flexible Bronchoscopy [phone])  Indication(s) ETT position check Desaturations  Consent Unable to obtain consent due to emergent nature of procedure.  Anesthesia Already well sedated on propofol and fentanyl, rocuronium 47m given in addition   Time Out Verified patient identification, verified procedure, site/side was marked, verified correct patient position, special equipment/implants available, medications/allergies/relevant history reviewed, required imaging and test results available.   Sterile Technique Usual hand hygiene, masks, gowns, and gloves were used   Procedure Description Bronchoscope advanced through endotracheal tube and into airway.  Airways were examined down to subsegmental level with findings noted below.   Following diagnostic evaluation, attempted to advance ETT over bronchoscope without success.  Decision made to reintubate patient over bougee.  Findings:  - ETT at level of first tracheal ring, stuck on this and unable to be advanced suspect secondary to loss of rigidity in plastic of ETT  - ETT exchanged over bougee (see separate note) and bronchoscope placed down new ETT with appropriate position 5cm above carina   Complications/Tolerance None; patient tolerated the procedure well. Chest X-ray is needed post procedure.   EBL Minimal   Specimen(s) none
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
## 2019-10-20 ENCOUNTER — Inpatient Hospital Stay (HOSPITAL_COMMUNITY): Payer: Medicaid Other

## 2019-10-20 DIAGNOSIS — J80 Acute respiratory distress syndrome: Secondary | ICD-10-CM | POA: Diagnosis not present

## 2019-10-20 DIAGNOSIS — U071 COVID-19: Secondary | ICD-10-CM | POA: Diagnosis not present

## 2019-10-20 LAB — GLUCOSE, CAPILLARY
Glucose-Capillary: 166 mg/dL — ABNORMAL HIGH (ref 70–99)
Glucose-Capillary: 171 mg/dL — ABNORMAL HIGH (ref 70–99)
Glucose-Capillary: 187 mg/dL — ABNORMAL HIGH (ref 70–99)
Glucose-Capillary: 192 mg/dL — ABNORMAL HIGH (ref 70–99)
Glucose-Capillary: 193 mg/dL — ABNORMAL HIGH (ref 70–99)
Glucose-Capillary: 193 mg/dL — ABNORMAL HIGH (ref 70–99)

## 2019-10-20 LAB — BASIC METABOLIC PANEL
Anion gap: 11 (ref 5–15)
BUN: 67 mg/dL — ABNORMAL HIGH (ref 6–20)
CO2: 32 mmol/L (ref 22–32)
Calcium: 7.6 mg/dL — ABNORMAL LOW (ref 8.9–10.3)
Chloride: 98 mmol/L (ref 98–111)
Creatinine, Ser: 2.25 mg/dL — ABNORMAL HIGH (ref 0.61–1.24)
GFR calc non Af Amer: 32 mL/min — ABNORMAL LOW (ref >60)
Glucose, Bld: 204 mg/dL — ABNORMAL HIGH (ref 70–99)
Potassium: 4.3 mmol/L (ref 3.5–5.1)
Sodium: 141 mmol/L (ref 135–145)

## 2019-10-20 LAB — CBC
HCT: 39.5 % (ref 39.0–52.0)
Hemoglobin: 12.4 g/dL — ABNORMAL LOW (ref 13.0–17.0)
MCH: 31.3 pg (ref 26.0–34.0)
MCHC: 31.4 g/dL (ref 30.0–36.0)
MCV: 99.7 fL (ref 80.0–100.0)
Platelets: 98 10*3/uL — ABNORMAL LOW (ref 150–400)
RBC: 3.96 MIL/uL — ABNORMAL LOW (ref 4.22–5.81)
RDW: 14 % (ref 11.5–15.5)
WBC: 18.6 10*3/uL — ABNORMAL HIGH (ref 4.0–10.5)
nRBC: 0.2 % (ref 0.0–0.2)

## 2019-10-20 LAB — PHOSPHORUS: Phosphorus: 6.2 mg/dL — ABNORMAL HIGH (ref 2.5–4.6)

## 2019-10-20 LAB — TRIGLYCERIDES: Triglycerides: 250 mg/dL — ABNORMAL HIGH (ref <150)

## 2019-10-20 LAB — MAGNESIUM: Magnesium: 2.6 mg/dL — ABNORMAL HIGH (ref 1.7–2.4)

## 2019-10-20 MED ORDER — INSULIN GLARGINE 100 UNIT/ML ~~LOC~~ SOLN
28.0000 [IU] | Freq: Two times a day (BID) | SUBCUTANEOUS | Status: DC
  Administered 2019-10-20 – 2019-10-21 (×3): 28 [IU] via SUBCUTANEOUS
  Filled 2019-10-20 (×6): qty 0.28

## 2019-10-20 MED ORDER — LINAGLIPTIN 5 MG PO TABS
5.0000 mg | ORAL_TABLET | Freq: Every day | ORAL | Status: DC
  Administered 2019-10-20 – 2019-11-04 (×16): 5 mg
  Filled 2019-10-20 (×16): qty 1

## 2019-10-20 MED ORDER — QUETIAPINE FUMARATE 50 MG PO TABS
50.0000 mg | ORAL_TABLET | Freq: Two times a day (BID) | ORAL | Status: DC
  Administered 2019-10-20 – 2019-10-22 (×6): 50 mg via ORAL
  Filled 2019-10-20 (×6): qty 1

## 2019-10-20 NOTE — Progress Notes (Signed)
Nutrition Follow-up  DOCUMENTATION CODES:   Not applicable  INTERVENTION:   Continue tube feeding via OG tube: Vital 1.5 at 60 ml/hr(1440 ml per day) Prosource TF 90 ml BID  Provides 2320 kcal, 141 gm protein, 1094 ml free water daily  Free water flushes 300 ml via tube every 6 hours provides a total of 2294 ml free water daily.   NUTRITION DIAGNOSIS:   Increased nutrient needs related to  (COVID) as evidenced by estimated needs. Ongoing  GOAL:   Patient will meet greater than or equal to 90% of their needs Met with TF  MONITOR:   TF tolerance, Labs  REASON FOR ASSESSMENT:   Consult, Ventilator Enteral/tube feeding initiation and management  ASSESSMENT:   Pt with PMH of enlarged cardiac ventricle and GERD who was dx with COVID PNA at [facility] on 9/22. Pt ultimately left hospital AMA and then was admitted to [place] 9/29 and was intubated and tx to [facility].   Patient remains intubated. S/P Cortrak placement with tip in the stomach 10/4. Currently receiving Vital 1.5 at 60 ml/h with Prosource TF 90 ml BID. Rectal tube placed 10/5. S/P bronchoscopy 10/5. May require trach tomorrow.  Patient remains intubated on ventilator support MV: 13.7 L/min Temp (24hrs), Avg:98.4 F (36.9 C), Min:98.2 F (36.8 C), Max:98.9 F (37.2 C) MAP range 70-93 this morning  Propofol at 11.9 ml/h providing 314 kcal from lipid  Labs reviewed. BUN 67, creat 2.25, phos 6.2, mag 2.6, triglycerides 250 CBG: [phone]  Medications reviewed and include colace, folic acid, novolog, lantus, tradjenta, miralax, thiamine, propofol.   Current weight 93.3 kg Admission weight 85.3 kg I/O +11 L since admission UOP 1905 ml x 24 hours  Diet Order:   Diet Order            Diet NPO time specified  Diet effective now                 EDUCATION NEEDS:   No education needs have been identified at this time  Skin:  Skin Assessment: Reviewed RN Assessment  Last BM:  10/6 type 7 rectal  tube  Height:   Ht Readings from Last 1 Encounters:  10/20/19 5' 11" (1.803 m)    Weight:   Wt Readings from Last 1 Encounters:  10/20/19 93.3 kg    BMI:  Body mass index is 28.69 kg/m.  Estimated Nutritional Needs:   Kcal:  2150-2400  Protein:  125-140 grams  Fluid:  > 2L/day    H, RD, LDN, CNSC Please refer to Amion for contact information.                                                        

## 2019-10-20 NOTE — Progress Notes (Signed)
Inpatient Diabetes Program Recommendations  AACE/ADA: New Consensus Statement on Inpatient Glycemic Control (2015)  Target Ranges:  Prepandial:   less than 140 mg/dL      Peak postprandial:   less than 180 mg/dL (1-2 hours)      Critically ill patients:  140 - 180 mg/dL   Lab Results  Component Value Date   GLUCAP 187 (H) 10/20/2019   HGBA1C 6.6 (H) 10/14/2019    Review of Glycemic Control Results for YANG, RACK (MRN [id number]) as of 10/20/2019 10:27  Ref. Range 10/19/2019 15:20 10/19/2019 19:26 10/19/2019 23:33 10/20/2019 03:48 10/20/2019 07:51  Glucose-Capillary Latest Ref Range: 70 - 99 mg/dL 284 (H) 132 (H) 440 (H) 192 (H) 187 (H)   Diabetes history: New onset? Outpatient Diabetes medications: none Current orders for Inpatient glycemic control: Novolog 0-20 units Q4H, Novolog 4 units Q4H, Lantus 25 units BID Solumedrol 40 mg QD  Inpatient Diabetes Program Recommendations:    Consider increasing Lantus to 28 units BID.  May also want to add Tradjenta 5 mg QD.   Thanks, Lujean Rave, MSN, RNC-OB Diabetes Coordinator [phone] (8a-5p)
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
## 2019-10-21 ENCOUNTER — Inpatient Hospital Stay (HOSPITAL_COMMUNITY): Payer: Medicaid Other

## 2019-10-21 DIAGNOSIS — U071 COVID-19: Secondary | ICD-10-CM | POA: Diagnosis not present

## 2019-10-21 DIAGNOSIS — J80 Acute respiratory distress syndrome: Secondary | ICD-10-CM | POA: Diagnosis not present

## 2019-10-21 LAB — POCT I-STAT 7, (LYTES, BLD GAS, ICA,H+H)
Acid-Base Excess: 11 mmol/L — ABNORMAL HIGH (ref 0.0–2.0)
Bicarbonate: 36 mmol/L — ABNORMAL HIGH (ref 20.0–28.0)
Calcium, Ion: 1.03 mmol/L — ABNORMAL LOW (ref 1.15–1.40)
HCT: 33 % — ABNORMAL LOW (ref 39.0–52.0)
Hemoglobin: 11.2 g/dL — ABNORMAL LOW (ref 13.0–17.0)
O2 Saturation: 85 %
Patient temperature: 99.4
Potassium: 3.6 mmol/L (ref 3.5–5.1)
Sodium: 143 mmol/L (ref 135–145)
TCO2: 37 mmol/L — ABNORMAL HIGH (ref 22–32)
pCO2 arterial: 49.7 mmHg — ABNORMAL HIGH (ref 32.0–48.0)
pH, Arterial: 7.469 — ABNORMAL HIGH (ref 7.350–7.450)
pO2, Arterial: 49 mmHg — ABNORMAL LOW (ref 83.0–108.0)

## 2019-10-21 LAB — CBC
HCT: 38.6 % — ABNORMAL LOW (ref 39.0–52.0)
Hemoglobin: 12.1 g/dL — ABNORMAL LOW (ref 13.0–17.0)
MCH: 31 pg (ref 26.0–34.0)
MCHC: 31.3 g/dL (ref 30.0–36.0)
MCV: 99 fL (ref 80.0–100.0)
Platelets: 135 10*3/uL — ABNORMAL LOW (ref 150–400)
RBC: 3.9 MIL/uL — ABNORMAL LOW (ref 4.22–5.81)
RDW: 14.1 % (ref 11.5–15.5)
WBC: 18.8 10*3/uL — ABNORMAL HIGH (ref 4.0–10.5)
nRBC: 0.2 % (ref 0.0–0.2)

## 2019-10-21 LAB — BASIC METABOLIC PANEL
Anion gap: 12 (ref 5–15)
BUN: 84 mg/dL — ABNORMAL HIGH (ref 6–20)
CO2: 32 mmol/L (ref 22–32)
Calcium: 7.7 mg/dL — ABNORMAL LOW (ref 8.9–10.3)
Chloride: 99 mmol/L (ref 98–111)
Creatinine, Ser: 2.7 mg/dL — ABNORMAL HIGH (ref 0.61–1.24)
GFR calc non Af Amer: 26 mL/min — ABNORMAL LOW (ref >60)
Glucose, Bld: 178 mg/dL — ABNORMAL HIGH (ref 70–99)
Potassium: 4.5 mmol/L (ref 3.5–5.1)
Sodium: 143 mmol/L (ref 135–145)

## 2019-10-21 LAB — HEPATIC FUNCTION PANEL
ALT: 123 U/L — ABNORMAL HIGH (ref 0–44)
AST: 38 U/L (ref 15–41)
Albumin: 1.8 g/dL — ABNORMAL LOW (ref 3.5–5.0)
Alkaline Phosphatase: 106 U/L (ref 38–126)
Bilirubin, Direct: 0.3 mg/dL — ABNORMAL HIGH (ref 0.0–0.2)
Indirect Bilirubin: 0.6 mg/dL (ref 0.3–0.9)
Total Bilirubin: 0.9 mg/dL (ref 0.3–1.2)
Total Protein: 5.6 g/dL — ABNORMAL LOW (ref 6.5–8.1)

## 2019-10-21 LAB — PHOSPHORUS: Phosphorus: 7.2 mg/dL — ABNORMAL HIGH (ref 2.5–4.6)

## 2019-10-21 LAB — GLUCOSE, CAPILLARY
Glucose-Capillary: 128 mg/dL — ABNORMAL HIGH (ref 70–99)
Glucose-Capillary: 130 mg/dL — ABNORMAL HIGH (ref 70–99)
Glucose-Capillary: 167 mg/dL — ABNORMAL HIGH (ref 70–99)
Glucose-Capillary: 47 mg/dL — ABNORMAL LOW (ref 70–99)
Glucose-Capillary: 59 mg/dL — ABNORMAL LOW (ref 70–99)
Glucose-Capillary: 84 mg/dL (ref 70–99)

## 2019-10-21 LAB — TRIGLYCERIDES: Triglycerides: 195 mg/dL — ABNORMAL HIGH (ref <150)

## 2019-10-21 LAB — MAGNESIUM: Magnesium: 2.9 mg/dL — ABNORMAL HIGH (ref 1.7–2.4)

## 2019-10-21 MED ORDER — DEXMEDETOMIDINE HCL IN NACL 400 MCG/100ML IV SOLN
0.4000 ug/kg/h | INTRAVENOUS | Status: DC
  Administered 2019-10-21: 0.4 ug/kg/h via INTRAVENOUS
  Administered 2019-10-22 (×2): 0.6 ug/kg/h via INTRAVENOUS
  Administered 2019-10-23: 0.4 ug/kg/h via INTRAVENOUS
  Administered 2019-10-23: 1.2 ug/kg/h via INTRAVENOUS
  Administered 2019-10-23: 0.8 ug/kg/h via INTRAVENOUS
  Filled 2019-10-21 (×2): qty 100
  Filled 2019-10-21: qty 200
  Filled 2019-10-21 (×2): qty 100

## 2019-10-21 MED ORDER — ETOMIDATE 2 MG/ML IV SOLN
INTRAVENOUS | Status: AC
  Administered 2019-10-21: 20 mg
  Filled 2019-10-21: qty 10

## 2019-10-21 MED ORDER — NOREPINEPHRINE 4 MG/250ML-% IV SOLN
0.0000 ug/min | INTRAVENOUS | Status: DC
  Administered 2019-10-21: 5 ug/min via INTRAVENOUS
  Administered 2019-10-22: 16 ug/min via INTRAVENOUS
  Filled 2019-10-21 (×2): qty 250

## 2019-10-21 MED ORDER — PHENYLEPHRINE 40 MCG/ML (10ML) SYRINGE FOR IV PUSH (FOR BLOOD PRESSURE SUPPORT)
PREFILLED_SYRINGE | INTRAVENOUS | Status: AC
  Filled 2019-10-21: qty 10

## 2019-10-21 MED ORDER — ALBUMIN HUMAN 25 % IV SOLN
25.0000 g | Freq: Four times a day (QID) | INTRAVENOUS | Status: AC
  Administered 2019-10-21 – 2019-10-22 (×4): 25 g via INTRAVENOUS
  Filled 2019-10-21 (×4): qty 100

## 2019-10-21 MED ORDER — ENOXAPARIN SODIUM 100 MG/ML ~~LOC~~ SOLN
1.0000 mg/kg | Freq: Two times a day (BID) | SUBCUTANEOUS | Status: DC
  Filled 2019-10-21: qty 0.95

## 2019-10-21 MED ORDER — ROCURONIUM BROMIDE 10 MG/ML (PF) SYRINGE
PREFILLED_SYRINGE | INTRAVENOUS | Status: AC
  Administered 2019-10-21: 100 mg
  Filled 2019-10-21: qty 10

## 2019-10-21 MED ORDER — DEXTROSE 50 % IV SOLN
INTRAVENOUS | Status: AC
  Administered 2019-10-21: 50 mL
  Filled 2019-10-21: qty 50

## 2019-10-21 NOTE — Progress Notes (Signed)
Updated mother Enid Derry at [phone] regarding tracheostomy and plans for sedation wean over next couple days.  Myrla Halsted MD PCCM

## 2019-10-21 NOTE — Procedures (Signed)
Bedside Tracheostomy Insertion Procedure Note   Patient Details:   Name: [name] DOB: 1965-01-29 MRN: [id number]  Procedure: Tracheostomy  Pre Procedure Assessment: ET Tube Size: 8.0 ET Tube secured at lip (cm): 23 Bite block in place: No Breath Sounds: Rhonch  Post Procedure Assessment: BP 107/66 (BP Location: Right Arm)   Pulse 85   Temp 98 F (36.7 C) (Axillary)   Resp (!) 30   Ht 5\' 11"  (1.803 m)   Wt 94 kg   SpO2 95%   BMI 28.90 kg/m  O2 sats: stable throughout Complications: No apparent complications Patient did tolerate procedure well Tracheostomy Brand:Shiley Tracheostomy Style:Cuffed Tracheostomy Size: 8 Tracheostomy Secured , velcro Tracheostomy Placement Confirmation:Trach cuff visualized and in place and Chest X ray ordered for placement    FMB:[id number] 10/21/2019, 4:45 PM
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
## 2019-10-21 NOTE — Plan of Care (Signed)
  Problem: Clinical Measurements: Goal: Ability to maintain clinical measurements within normal limits will improve Outcome: Progressing   Problem: Nutrition: Goal: Adequate nutrition will be maintained Outcome: Progressing   Problem: Respiratory: Goal: Ability to maintain a clear airway and adequate ventilation will improve Outcome: Progressing

## 2019-10-21 NOTE — Progress Notes (Signed)
eLink Physician-Brief Progress Note Patient Name: Bruce Escobar DOB: [birth year] MRN: [id number]   Date of Service  10/21/2019  HPI/Events of Note  Hypotension  eICU Interventions  Norepinephrine infusion ordered.        [name] Orine Goga 10/21/2019, 10:34 PM

## 2019-10-21 NOTE — TOC Initial Note (Signed)
Transition of Care [facility]) - Initial/Assessment Note    Patient Details  Name: Bruce Escobar MRN: [id number] Date of Birth: 1965-06-24  Transition of Care [facility]) CM/SW Contact:    Lockie Pares, RN Phone Number: 10/21/2019, 5:49 PM  Clinical Narrative:                 [facility] consult to work on [facility] and DME needs. Patient currently trached, weaning off sedation to try to wean off ventilator. Currently has NOK listed as friends, but mother is NOK, MD has spoken with her Will need to plan where he will stay. He has no insurance., what his needs will be as far as oxygen and medication, DME HH. CM will continue to follow.   Expected Discharge Plan: Home w Home Health Services Barriers to Discharge: Inadequate or no insurance   Patient Goals and CMS Choice        Expected Discharge Plan and Services Expected Discharge Plan: Home w Home Health Services   Discharge Planning Services: CM Consult   Living arrangements for the past 2 months: Single Family Home                                      Prior Living Arrangements/Services Living arrangements for the past 2 months: Single Family Home   Patient language and need for interpreter reviewed:: Yes        Need for Family Participation in Patient Care: Yes (Comment) Care giver support system in place?: Yes (comment)   Criminal Activity/Legal Involvement Pertinent to Current Situation/Hospitalization: No - Comment as needed  Activities of Daily Living      Permission Sought/Granted                  Emotional Assessment   Attitude/Demeanor/Rapport: Intubated (Following Commands or Not Following Commands) Affect (typically observed): Unable to Assess Orientation: : Fluctuating Orientation (Suspected and/or reported Sundowners) (sedated on vent) Alcohol / Substance Use: Not Applicable Psych Involvement: No (comment)  Admission diagnosis:  SOB (shortness of breath) [R06.02] Encounter for intubation  [Z01.818] Acute respiratory failure with hypoxia (HCC) [J96.01] Community acquired pneumonia of left lung, unspecified part of lung [J18.9] COVID-19 virus infection [U07.1] COVID-19 [U07.1] Acute respiratory distress syndrome (ARDS) due to COVID-19 virus (HCC) [U07.1, J80] Patient Active Problem List   Diagnosis Date Noted  . COVID-19 virus infection 10/14/2019  . Acute respiratory distress syndrome (ARDS) due to COVID-19 virus (HCC) 10/14/2019  . Acute respiratory failure with hypoxia (HCC)   . 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 Nicotine dependence 10/28/2014  . Fracture, tibia, open 10/27/2014  . History of surgical procedure 10/20/2014  . 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 Closed fracture of tuberosity of scaphoid 09/22/2014  . Infection and inflammatory reaction due to internal prosthetic device, implant, and graft 09/22/2014  . Infection with methicillin-resistant Staphylococcus aureus 09/22/2014  . Fracture of tibia, shaft, open 09/22/2014  . Open leg wound 09/09/2014  . Osteomyelitis (HCC)   . Clonus   . Cellulitis of right lower extremity   . MRSA infection   . Fracture of tibial shaft, right, open   . Osteomyelitis of right lower extremity (HCC) 08/22/2014  . Hardware complicating wound infection (HCC)   . Chronic paronychia of toe   . MRSA colonization   . Elevated lactic acid level 08/20/2014  .  Leukocytosis 08/20/2014  . Acute hyponatremia 08/20/2014  . Cellulitis of right anterior lower leg 08/20/2014  . Sepsis affecting skin 08/20/2014  . Posthemorrhagic anemia 10/09/2013  . History of Open fracture of tibia and fibula/post nailing Sept 2015 10/07/2013  . Fracture of fibula with tibia, open 10/07/2013  . DYSPNEA 10/12/2008  . ELECTROCARDIOGRAM, ABNORMAL 10/12/2008   PCP:  Patient, No Pcp Per Pharmacy:   [id number] - [place], [place] - [address] AT COR[address] OF [facility]  [address] [place] [address] Phone: [phone] Fax: [phone]     Social Determinants of Health (SDOH) Interventions    Readmission Risk Interventions No flowsheet data found.

## 2019-10-21 NOTE — Procedures (Signed)
Diagnostic Bronchoscopy  Bruce Escobar  [id number]  [birth year]  Date:10/21/19  Time:4:30 PM   Provider Performing:Heleena Miceli F Earlene Plater   Procedure: Diagnostic Bronchoscopy [phone])  Indication(s) Assist with direct visualization of tracheostomy placement  Consent Risks of the procedure as well as the alternatives and risks of each were explained to the patient and/or caregiver.  Consent for the procedure was obtained.   Anesthesia See separate tracheostomy note   Time Out Verified patient identification, verified procedure, site/side was marked, verified correct patient position, special equipment/implants available, medications/allergies/relevant history reviewed, required imaging and test results available.   Sterile Technique Usual hand hygiene, masks, gowns, and gloves were used   Procedure Description Bronchoscope advanced through endotracheal tube and into airway.  After suctioning out tracheal secretions, bronchoscope used to provide direct visualization of tracheostomy placement.   Complications/Tolerance None; patient tolerated the procedure well.   EBL None  Specimen(s) None   Delfin Gant, NP-C [facility] Contact / Pager information can be found on Amion  10/21/2019, 4:30 PM

## 2019-10-21 NOTE — Procedures (Signed)
Percutaneous Tracheostomy Procedure Note   Bruce Escobar  [id number]  12/13/1965  Date:10/21/19  Time:4:35 PM   Provider Performing:Shresta Risden Erby Pian  Procedure: Percutaneous Tracheostomy with Bronchoscopic Guidance (52841)  Indication(s) Persistent respiratory failure  Consent Risks of the procedure as well as the alternatives and risks of each were explained to the patient and/or caregiver.  Consent for the procedure was obtained.  Anesthesia Etomidate, Versed, Fentanyl, Vecuronium   Time Out Verified patient identification, verified procedure, site/side was marked, verified correct patient position, special equipment/implants available, medications/allergies/relevant history reviewed, required imaging and test results available.   Sterile Technique Maximal sterile technique including sterile barrier drape, hand hygiene, sterile gown, sterile gloves, mask, hair covering.    Procedure Description Appropriate anatomy identified by palpation.  Patient's neck prepped and draped in sterile fashion.  1% lidocaine with epinephrine was used to anesthetize skin overlying neck.  1.5cm incision made and blunt dissection performed until tracheal rings could be easily palpated.   Then a size 8-0 Shiley tracheostomy was placed under bronchoscopic visualization using usual Seldinger technique and serial dilation.   Bronchoscope confirmed placement above the carina.  Tracheostomy was sutured in place with adhesive pad to protect skin under pressure.    Patient connected to ventilator.   Complications/Tolerance None; patient tolerated the procedure well. Chest X-ray is ordered to confirm no post-procedural complication.   EBL Minimal   Specimen(s) None

## 2019-10-22 DIAGNOSIS — J9601 Acute respiratory failure with hypoxia: Secondary | ICD-10-CM | POA: Diagnosis not present

## 2019-10-22 DIAGNOSIS — J80 Acute respiratory distress syndrome: Secondary | ICD-10-CM | POA: Diagnosis not present

## 2019-10-22 DIAGNOSIS — U071 COVID-19: Secondary | ICD-10-CM | POA: Diagnosis not present

## 2019-10-22 LAB — GLUCOSE, CAPILLARY
Glucose-Capillary: 95 mg/dL (ref 70–99)
Glucose-Capillary: 111 mg/dL — ABNORMAL HIGH (ref 70–99)
Glucose-Capillary: 145 mg/dL — ABNORMAL HIGH (ref 70–99)
Glucose-Capillary: 112 mg/dL — ABNORMAL HIGH (ref 70–99)
Glucose-Capillary: 117 mg/dL — ABNORMAL HIGH (ref 70–99)
Glucose-Capillary: 87 mg/dL (ref 70–99)

## 2019-10-22 LAB — TRIGLYCERIDES: Triglycerides: 133 mg/dL (ref <150)

## 2019-10-22 LAB — TYPE AND SCREEN
ABO/RH(D): O POS
Antibody Screen: NEGATIVE

## 2019-10-22 LAB — BASIC METABOLIC PANEL
Anion gap: 13 (ref 5–15)
BUN: 106 mg/dL — ABNORMAL HIGH (ref 6–20)
CO2: 30 mmol/L (ref 22–32)
Calcium: 7.4 mg/dL — ABNORMAL LOW (ref 8.9–10.3)
Chloride: 99 mmol/L (ref 98–111)
Creatinine, Ser: 3.36 mg/dL — ABNORMAL HIGH (ref 0.61–1.24)
GFR calc non Af Amer: 20 mL/min — ABNORMAL LOW (ref >60)
Glucose, Bld: 105 mg/dL — ABNORMAL HIGH (ref 70–99)
Potassium: 4 mmol/L (ref 3.5–5.1)
Sodium: 142 mmol/L (ref 135–145)

## 2019-10-22 LAB — CBC
HCT: 31 % — ABNORMAL LOW (ref 39.0–52.0)
HCT: 27.7 % — ABNORMAL LOW (ref 39.0–52.0)
Hemoglobin: 10 g/dL — ABNORMAL LOW (ref 13.0–17.0)
Hemoglobin: 8.8 g/dL — ABNORMAL LOW (ref 13.0–17.0)
MCH: 31.4 pg (ref 26.0–34.0)
MCH: 30.7 pg (ref 26.0–34.0)
MCHC: 32.3 g/dL (ref 30.0–36.0)
MCHC: 31.8 g/dL (ref 30.0–36.0)
MCV: 97.5 fL (ref 80.0–100.0)
MCV: 96.5 fL (ref 80.0–100.0)
Platelets: 145 10*3/uL — ABNORMAL LOW (ref 150–400)
Platelets: 126 10*3/uL — ABNORMAL LOW (ref 150–400)
RBC: 3.18 MIL/uL — ABNORMAL LOW (ref 4.22–5.81)
RBC: 2.87 MIL/uL — ABNORMAL LOW (ref 4.22–5.81)
RDW: 14.1 % (ref 11.5–15.5)
RDW: 14 % (ref 11.5–15.5)
WBC: 16.8 10*3/uL — ABNORMAL HIGH (ref 4.0–10.5)
WBC: 12.2 10*3/uL — ABNORMAL HIGH (ref 4.0–10.5)
nRBC: 0.1 % (ref 0.0–0.2)
nRBC: 0 % (ref 0.0–0.2)

## 2019-10-22 LAB — MAGNESIUM: Magnesium: 2.7 mg/dL — ABNORMAL HIGH (ref 1.7–2.4)

## 2019-10-22 LAB — HEPARIN ANTI-XA: Heparin LMW: 0.65 IU/mL

## 2019-10-22 LAB — PROTIME-INR
INR: 1.3 — ABNORMAL HIGH (ref 0.8–1.2)
Prothrombin Time: 15.4 seconds — ABNORMAL HIGH (ref 11.4–15.2)

## 2019-10-22 LAB — APTT: aPTT: 43 seconds — ABNORMAL HIGH (ref 24–36)

## 2019-10-22 LAB — PHOSPHORUS: Phosphorus: 5.2 mg/dL — ABNORMAL HIGH (ref 2.5–4.6)

## 2019-10-22 MED ORDER — INSULIN GLARGINE 100 UNIT/ML ~~LOC~~ SOLN
14.0000 [IU] | Freq: Two times a day (BID) | SUBCUTANEOUS | Status: DC
  Administered 2019-10-23 – 2019-11-03 (×24): 14 [IU] via SUBCUTANEOUS
  Filled 2019-10-22 (×29): qty 0.14

## 2019-10-22 MED ORDER — SODIUM CHLORIDE 0.9 % IV SOLN
30.0000 ug | Freq: Once | INTRAVENOUS | Status: AC
  Administered 2019-10-22: 30 ug via INTRAVENOUS
  Filled 2019-10-22: qty 7.5

## 2019-10-22 MED ORDER — NOREPINEPHRINE 4 MG/250ML-% IV SOLN
INTRAVENOUS | Status: AC
  Administered 2019-10-22: 2 ug/min via INTRAVENOUS
  Filled 2019-10-22: qty 250

## 2019-10-22 MED ORDER — LIDOCAINE-EPINEPHRINE 1 %-1:100000 IJ SOLN
30.0000 mL | Freq: Once | INTRAMUSCULAR | Status: AC
  Administered 2019-10-22: 30 mL
  Filled 2019-10-22: qty 30

## 2019-10-22 MED ORDER — "THROMBI-PAD 3""X3"" EX PADS"
4.0000 | MEDICATED_PAD | CUTANEOUS | Status: AC
  Administered 2019-10-22: 4 via TOPICAL
  Filled 2019-10-22: qty 4

## 2019-10-22 MED ORDER — MIDAZOLAM HCL 2 MG/2ML IJ SOLN
1.0000 mg | INTRAMUSCULAR | Status: DC | PRN
  Administered 2019-10-22 (×2): 2 mg via INTRAVENOUS
  Administered 2019-10-23: 4 mg via INTRAVENOUS
  Administered 2019-10-23: 2 mg via INTRAVENOUS
  Filled 2019-10-22 (×3): qty 2
  Filled 2019-10-22: qty 4

## 2019-10-22 MED ORDER — LACTATED RINGERS IV BOLUS
500.0000 mL | Freq: Once | INTRAVENOUS | Status: AC
  Administered 2019-10-22: 500 mL via INTRAVENOUS

## 2019-10-22 MED ORDER — NOREPINEPHRINE 4 MG/250ML-% IV SOLN
0.0000 ug/min | INTRAVENOUS | Status: DC
  Administered 2019-10-23: 19 ug/min via INTRAVENOUS
  Administered 2019-10-23: 33 ug/min via INTRAVENOUS
  Filled 2019-10-22 (×2): qty 250

## 2019-10-22 MED ORDER — FENTANYL CITRATE (PF) 100 MCG/2ML IJ SOLN
200.0000 ug | Freq: Once | INTRAMUSCULAR | Status: AC
  Administered 2019-10-22: 200 ug via INTRAVENOUS
  Filled 2019-10-22: qty 4

## 2019-10-22 MED ORDER — MIDAZOLAM HCL 2 MG/2ML IJ SOLN
4.0000 mg | Freq: Once | INTRAMUSCULAR | Status: AC
  Administered 2019-10-22: 4 mg via INTRAVENOUS
  Filled 2019-10-22: qty 4

## 2019-10-22 MED ORDER — ROCURONIUM BROMIDE 10 MG/ML (PF) SYRINGE
50.0000 mg | PREFILLED_SYRINGE | Freq: Once | INTRAVENOUS | Status: AC
  Administered 2019-10-22: 50 mg via INTRAVENOUS
  Filled 2019-10-22: qty 10

## 2019-10-22 NOTE — Progress Notes (Signed)
PT Cancellation Note  Patient Details Name: RONDALL RADIGAN MRN: [id number] DOB: Nov 20, 1965   Cancelled Treatment:    Reason Eval/Treat Not Completed: Medical issues which prohibited therapy.  RN reports not following commands and still issues with bleeding from his new trach site.  PT orders are automatically generated by trach order set, so we will follow up on Monday and if not medically appropriate then sign off and await new orders.   Thanks,  Corinna Capra, PT, DPT  Acute Rehabilitation [phone] pager #[phone] office       Lurena Joiner B Lunabella Badgett 10/22/2019, 2:41 PM

## 2019-10-22 NOTE — Progress Notes (Signed)
   Because of continued oozing of blood discussed with local critical care colleagues.  I have now applied 20 cc of 1% lidocaine mixed with epinephrine topically just squirting around the area.  And then 5 cc of the same through the tracheostomy.  And then another 5-10 cc injected subdermally under the tracheal stoma.  Noticed that he was coughing and bucking the vent adding positive pressure.  Therefore we will give deep sedation and paralysis for 90 minutes  Additional 30 minutes critical care time     SIGNATURE    Dr. Kalman Shan, M.D., F.C.C.P,  Pulmonary and Critical Care Medicine Staff Physician, [facility] Director - Interstitial Lung Disease  Program  Pulmonary Fibrosis [facility] at [facility] [place], [place], [address]  Pager: [phone], If no answer  OR between  19:00-7:00h: page [phone] Telephone (clinical office): [phone] Telephone (research): [phone]  11:33 AM 10/22/2019

## 2019-10-22 NOTE — Progress Notes (Signed)
   PM rounds - per RN oozing from trach though improved. This is post ddavp and local epi and blood from condom cath.     Plan - place foley  - recheck cbc at 9pm with type and screen   Additional 15 min ccm time     SIGNATURE    Dr. Kalman Shan, M.D., F.C.C.P,  Pulmonary and Critical Care Medicine Staff Physician, [facility] Center Director - Interstitial Lung Disease  Program  Pulmonary Fibrosis [facility] at [facility] [place], [place], [address]  Pager: [phone], If no answer  OR between  19:00-7:00h: page [phone]  [phone] Telephone (clinical office): [phone] Telephone (research): [phone]  6:11 PM 10/22/2019

## 2019-10-22 NOTE — Progress Notes (Addendum)
SLP Cancellation Note  Patient Details Name: MANN SKAGGS MRN: [id number] DOB: Feb 16, 1965   Cancelled treatment:        Trach'd yesterday. Sedated per RN (starting to wean) in addition he is bleeding at trach site. Will plan to follow up Monday.   Royce Macadamia 10/22/2019, 9:49 AM   Breck Coons Lonell Face.Ed Nurse, children's [phone] Office [phone]
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
## 2019-10-22 NOTE — Progress Notes (Signed)
eLink Physician-Brief Progress Note Patient Name: Bruce Escobar DOB: 1965-08-09 MRN: [id number]   Date of Service  10/22/2019  HPI/Events of Note  Hypotension on current sedation regimen, patient is also in positive fluid balance precluding fluid bolus. Norepinephrine order was discontinued earlier toady.  eICU Interventions  Norepinephrine infusion restarted.        [name] Lucky Alverson 10/22/2019, 9:47 PM

## 2019-10-22 NOTE — Progress Notes (Signed)
   RN reports improved but continued oozing after epi injection locally and also 90 min of nimbex. Though overall slowed down.   Plan  - d./w Dr Katrinka Blazing and Chestine Spore -> will give DDAVP 0.31mcg/kg and max dose     SIGNATURE    Dr. Kalman Shan, M.D., F.C.C.P,  Pulmonary and Critical Care Medicine Staff Physician, [facility] Director - Interstitial Lung Disease  Program  Pulmonary Fibrosis [facility] at [facility] [place], [place], [address]  Pager: [phone], If no answer  OR between  19:00-7:00h: page [phone] Telephone (clinical office): [phone] Telephone (research): [phone]  2:37 PM 10/22/2019

## 2019-10-23 ENCOUNTER — Inpatient Hospital Stay (HOSPITAL_COMMUNITY): Payer: Medicaid Other

## 2019-10-23 DIAGNOSIS — J9601 Acute respiratory failure with hypoxia: Secondary | ICD-10-CM | POA: Diagnosis not present

## 2019-10-23 DIAGNOSIS — J80 Acute respiratory distress syndrome: Secondary | ICD-10-CM | POA: Diagnosis not present

## 2019-10-23 DIAGNOSIS — U071 COVID-19: Secondary | ICD-10-CM | POA: Diagnosis not present

## 2019-10-23 LAB — COMPREHENSIVE METABOLIC PANEL
ALT: 56 U/L — ABNORMAL HIGH (ref 0–44)
AST: 34 U/L (ref 15–41)
Albumin: 1.8 g/dL — ABNORMAL LOW (ref 3.5–5.0)
Alkaline Phosphatase: 87 U/L (ref 38–126)
Anion gap: 10 (ref 5–15)
BUN: 105 mg/dL — ABNORMAL HIGH (ref 6–20)
CO2: 32 mmol/L (ref 22–32)
Calcium: 7.6 mg/dL — ABNORMAL LOW (ref 8.9–10.3)
Chloride: 101 mmol/L (ref 98–111)
Creatinine, Ser: 3.3 mg/dL — ABNORMAL HIGH (ref 0.61–1.24)
GFR, Estimated: 20 mL/min — ABNORMAL LOW (ref >60)
Glucose, Bld: 132 mg/dL — ABNORMAL HIGH (ref 70–99)
Potassium: 4.1 mmol/L (ref 3.5–5.1)
Sodium: 143 mmol/L (ref 135–145)
Total Bilirubin: 0.9 mg/dL (ref 0.3–1.2)
Total Protein: 5 g/dL — ABNORMAL LOW (ref 6.5–8.1)

## 2019-10-23 LAB — LACTIC ACID, PLASMA
Lactic Acid, Venous: 1.2 mmol/L (ref 0.5–1.9)
Lactic Acid, Venous: 1.4 mmol/L (ref 0.5–1.9)

## 2019-10-23 LAB — POCT I-STAT 7, (LYTES, BLD GAS, ICA,H+H)
Acid-Base Excess: 7 mmol/L — ABNORMAL HIGH (ref 0.0–2.0)
Bicarbonate: 34.2 mmol/L — ABNORMAL HIGH (ref 20.0–28.0)
Calcium, Ion: 1.13 mmol/L — ABNORMAL LOW (ref 1.15–1.40)
HCT: 28 % — ABNORMAL LOW (ref 39.0–52.0)
Hemoglobin: 9.5 g/dL — ABNORMAL LOW (ref 13.0–17.0)
O2 Saturation: 89 %
Patient temperature: 102.7
Potassium: 3.9 mmol/L (ref 3.5–5.1)
Sodium: 140 mmol/L (ref 135–145)
TCO2: 36 mmol/L — ABNORMAL HIGH (ref 22–32)
pCO2 arterial: 67.1 mmHg (ref 32.0–48.0)
pH, Arterial: 7.326 — ABNORMAL LOW (ref 7.350–7.450)
pO2, Arterial: 71 mmHg — ABNORMAL LOW (ref 83.0–108.0)

## 2019-10-23 LAB — CBC
HCT: 29.1 % — ABNORMAL LOW (ref 39.0–52.0)
Hemoglobin: 9.2 g/dL — ABNORMAL LOW (ref 13.0–17.0)
MCH: 30.5 pg (ref 26.0–34.0)
MCHC: 31.6 g/dL (ref 30.0–36.0)
MCV: 96.4 fL (ref 80.0–100.0)
Platelets: 140 10*3/uL — ABNORMAL LOW (ref 150–400)
RBC: 3.02 MIL/uL — ABNORMAL LOW (ref 4.22–5.81)
RDW: 14 % (ref 11.5–15.5)
WBC: 14.2 10*3/uL — ABNORMAL HIGH (ref 4.0–10.5)
nRBC: 0 % (ref 0.0–0.2)

## 2019-10-23 LAB — PROCALCITONIN: Procalcitonin: 15.92 ng/mL

## 2019-10-23 LAB — PHOSPHORUS: Phosphorus: 5.3 mg/dL — ABNORMAL HIGH (ref 2.5–4.6)

## 2019-10-23 LAB — GLUCOSE, CAPILLARY
Glucose-Capillary: 142 mg/dL — ABNORMAL HIGH (ref 70–99)
Glucose-Capillary: 114 mg/dL — ABNORMAL HIGH (ref 70–99)
Glucose-Capillary: 126 mg/dL — ABNORMAL HIGH (ref 70–99)
Glucose-Capillary: 161 mg/dL — ABNORMAL HIGH (ref 70–99)
Glucose-Capillary: 127 mg/dL — ABNORMAL HIGH (ref 70–99)
Glucose-Capillary: 117 mg/dL — ABNORMAL HIGH (ref 70–99)

## 2019-10-23 LAB — TRIGLYCERIDES: Triglycerides: 173 mg/dL — ABNORMAL HIGH (ref <150)

## 2019-10-23 LAB — TROPONIN I (HIGH SENSITIVITY)
Troponin I (High Sensitivity): 27 ng/L — ABNORMAL HIGH (ref <18)
Troponin I (High Sensitivity): 26 ng/L — ABNORMAL HIGH (ref <18)
Troponin I (High Sensitivity): 27 ng/L — ABNORMAL HIGH (ref <18)

## 2019-10-23 LAB — CK TOTAL AND CKMB (NOT AT ARMC)
CK, MB: 2.2 ng/mL (ref 0.5–5.0)
Relative Index: INVALID (ref 0.0–2.5)
Total CK: 96 U/L (ref 49–397)

## 2019-10-23 LAB — MAGNESIUM: Magnesium: 2.4 mg/dL (ref 1.7–2.4)

## 2019-10-23 MED ORDER — VASOPRESSIN 20 UNITS/100 ML INFUSION FOR SHOCK
0.0000 [IU]/min | INTRAVENOUS | Status: DC
  Administered 2019-10-24 – 2019-10-25 (×5): 0.03 [IU]/min via INTRAVENOUS
  Filled 2019-10-23 (×5): qty 100

## 2019-10-23 MED ORDER — ALBUTEROL SULFATE (2.5 MG/3ML) 0.083% IN NEBU
2.5000 mg | INHALATION_SOLUTION | Freq: Three times a day (TID) | RESPIRATORY_TRACT | Status: AC
  Administered 2019-10-23 – 2019-11-04 (×35): 2.5 mg via RESPIRATORY_TRACT
  Filled 2019-10-23 (×36): qty 3

## 2019-10-23 MED ORDER — ASPIRIN 81 MG PO CHEW
81.0000 mg | CHEWABLE_TABLET | Freq: Every day | ORAL | Status: DC
  Administered 2019-10-23 – 2019-10-26 (×4): 81 mg via ORAL
  Filled 2019-10-23 (×4): qty 1

## 2019-10-23 MED ORDER — LACTATED RINGERS IV BOLUS
500.0000 mL | Freq: Once | INTRAVENOUS | Status: AC
  Administered 2019-10-23: 500 mL via INTRAVENOUS

## 2019-10-23 MED ORDER — CISATRACURIUM BESYLATE 20 MG/10ML IV SOLN
10.0000 mg | Freq: Once | INTRAVENOUS | Status: AC
  Administered 2019-10-23: 10 mg via INTRAVENOUS
  Filled 2019-10-23: qty 10

## 2019-10-23 MED ORDER — SODIUM CHLORIDE 0.9 % IV SOLN
2.0000 g | INTRAVENOUS | Status: DC
  Administered 2019-10-23 – 2019-10-27 (×5): 2 g via INTRAVENOUS
  Filled 2019-10-23 (×5): qty 2

## 2019-10-23 MED ORDER — FENTANYL CITRATE (PF) 100 MCG/2ML IJ SOLN
25.0000 ug | INTRAMUSCULAR | Status: DC | PRN
  Administered 2019-10-23: 100 ug via INTRAVENOUS
  Administered 2019-10-23: 50 ug via INTRAVENOUS
  Administered 2019-10-23 – 2019-10-25 (×2): 100 ug via INTRAVENOUS
  Administered 2019-10-25: 50 ug via INTRAVENOUS
  Administered 2019-10-25 – 2019-10-26 (×5): 100 ug via INTRAVENOUS
  Administered 2019-10-29: 50 ug via INTRAVENOUS
  Administered 2019-10-29 – 2019-10-30 (×2): 100 ug via INTRAVENOUS
  Administered 2019-11-01 – 2019-11-02 (×5): 50 ug via INTRAVENOUS

## 2019-10-23 MED ORDER — MIDAZOLAM 50MG/50ML (1MG/ML) PREMIX INFUSION
0.5000 mg/h | INTRAVENOUS | Status: DC
  Administered 2019-10-23: 2 mg/h via INTRAVENOUS
  Administered 2019-10-23: 3 mg/h via INTRAVENOUS
  Administered 2019-10-24 (×4): 8 mg/h via INTRAVENOUS
  Administered 2019-10-25: 8.5 mg/h via INTRAVENOUS
  Administered 2019-10-25: 7 mg/h via INTRAVENOUS
  Filled 2019-10-23 (×10): qty 50

## 2019-10-23 MED ORDER — ENOXAPARIN SODIUM 100 MG/ML ~~LOC~~ SOLN
1.0000 mg/kg | SUBCUTANEOUS | Status: DC
  Administered 2019-10-24 – 2019-10-25 (×2): 92.5 mg via SUBCUTANEOUS
  Filled 2019-10-23 (×2): qty 0.93

## 2019-10-23 MED ORDER — QUETIAPINE FUMARATE 50 MG PO TABS
50.0000 mg | ORAL_TABLET | Freq: Two times a day (BID) | ORAL | Status: DC
  Administered 2019-10-23 – 2019-10-26 (×8): 50 mg
  Filled 2019-10-23 (×8): qty 1

## 2019-10-23 MED ORDER — NITROGLYCERIN IN D5W 200-5 MCG/ML-% IV SOLN
0.0000 ug/min | INTRAVENOUS | Status: DC
  Administered 2019-10-23: 5 ug/min via INTRAVENOUS
  Filled 2019-10-23: qty 250

## 2019-10-23 MED ORDER — ENOXAPARIN SODIUM 30 MG/0.3ML ~~LOC~~ SOLN
30.0000 mg | Freq: Once | SUBCUTANEOUS | Status: AC
  Administered 2019-10-23: 30 mg via SUBCUTANEOUS
  Filled 2019-10-23: qty 0.3

## 2019-10-23 MED ORDER — ASPIRIN 325 MG PO TABS: 325.0000 mg | ORAL_TABLET | Freq: Every day | ORAL | Status: DC

## 2019-10-23 MED ORDER — ASPIRIN 325 MG PO TABS
325.0000 mg | ORAL_TABLET | Freq: Every day | ORAL | Status: DC
  Administered 2019-10-23: 325 mg via ORAL
  Filled 2019-10-23: qty 1

## 2019-10-23 MED ORDER — FENTANYL 2500MCG IN NS 250ML (10MCG/ML) PREMIX INFUSION: 0.0000 ug/h | INTRAVENOUS | Status: DC

## 2019-10-23 MED ORDER — MIDAZOLAM HCL 2 MG/2ML IJ SOLN
4.0000 mg | Freq: Once | INTRAMUSCULAR | Status: AC
  Administered 2019-10-23: 4 mg via INTRAVENOUS
  Filled 2019-10-23: qty 4

## 2019-10-23 MED ORDER — ACETYLCYSTEINE 20 % IN SOLN
2.0000 mL | Freq: Three times a day (TID) | RESPIRATORY_TRACT | Status: DC
  Administered 2019-10-23 – 2019-10-27 (×12): 2 mL via RESPIRATORY_TRACT
  Filled 2019-10-23: qty 4
  Filled 2019-10-23: qty 2
  Filled 2019-10-23 (×23): qty 4

## 2019-10-23 MED ORDER — NOREPINEPHRINE 16 MG/250ML-% IV SOLN
0.0000 ug/min | INTRAVENOUS | Status: DC
  Administered 2019-10-23: 34 ug/min via INTRAVENOUS
  Administered 2019-10-23: 45 ug/min via INTRAVENOUS
  Administered 2019-10-24: 40 ug/min via INTRAVENOUS
  Administered 2019-10-24 – 2019-10-27 (×2): 12 ug/min via INTRAVENOUS
  Administered 2019-10-28: 16 ug/min via INTRAVENOUS
  Administered 2019-10-29: 10 ug/min via INTRAVENOUS
  Administered 2019-10-29: 16 ug/min via INTRAVENOUS
  Filled 2019-10-23 (×10): qty 250

## 2019-10-23 MED ORDER — ACETYLCYSTEINE 10 % IN SOLN
2.0000 mL | RESPIRATORY_TRACT | Status: DC
  Filled 2019-10-23 (×2): qty 2

## 2019-10-23 NOTE — Progress Notes (Signed)
Contacted [facility] concerning increased RR despite giving PRN Versed and titrating other continuous sedation medications. New orders for PRN Fentanyl added.

## 2019-10-23 NOTE — Progress Notes (Signed)
eLink Physician-Brief Progress Note Patient Name: Bruce Escobar DOB: [birth year] MRN: [id number]   Date of Service  10/23/2019  HPI/Events of Note  Patient with EKG suggestive of anterior STEMI, I paged Dr. Nanetta Batty regarding acute coronary syndrome, per Dr. Marina Goodell patient is not a candidate for the cath lab, patient also has recent bleeding at his tracheal site precluding Heparin anticoagulation, I have started him on a Nitroglycerine infusion and Aspirin 325 mg po daily, a formal cardiology consult was entered. CXR showed extensive bilateral pneumonia disproportionately affecting the lower lung zones.  eICU Interventions  See above. ABG is pending.        [name] Easter Kennebrew 10/23/2019, 5:07 AM

## 2019-10-23 NOTE — Progress Notes (Signed)
CRITICAL VALUE ALERT  Critical Value:  Acute MI/STEMI per 12 lead  Date & Time Notied:  10/23/2019 @ 0457  Provider Notified: Pola Corn MD  Orders Received/Actions taken: Pt bedside monitoring alarming for ST elevation which is new. 12 lead EKG done per protocol. [facility] contacted and made aware of 12 lead results.

## 2019-10-23 NOTE — Progress Notes (Signed)
eLink Physician-Brief Progress Note Patient Name: SHERRON MUMMERT DOB: 1965-09-23 MRN: [id number]   Date of Service  10/23/2019  HPI/Events of Note  Patient with marked ventilator dyssynchrony.  eICU Interventions  Bedside RN instructed to titrate up Versed infusion, Nimbex 10 mg iv x 1 ordered.        [name] Cardelia Sassano 10/23/2019, 10:06 PM

## 2019-10-23 NOTE — Progress Notes (Signed)
Nitro drip started per order at . Initially pts SBP 140's however after about only 15 minutes of the drip running the pts BP quickly dropped to a SBP <100. Nitro stopped and levophed restarted for MAP goals. Contacted [facility] about nitro drip goals.
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
## 2019-10-23 NOTE — Progress Notes (Signed)
eLink Physician-Brief Progress Note Patient Name: Bruce Escobar DOB: 02/11/1965 MRN: [id number]   Date of Service  10/23/2019  HPI/Events of Note  Patient dropped his blood pressure in response to the Nitroglycerine infusion and had to be started on Levo infusion.  eICU Interventions  Nitroglycerine infusion discontinued.        [name] Christyne Mccain 10/23/2019, 6:20 AM

## 2019-10-23 NOTE — Progress Notes (Signed)
eLink Physician-Brief Progress Note Patient Name: Bruce Escobar DOB: 15-Sep-1965 MRN: [id number]   Date of Service  10/23/2019  HPI/Events of Note  Patient with tachypnea and respiratory distress not improved by PRN Versed.  eICU Interventions  Versed 4 mg iv x 1 then begin Versed infusion, Nimbex 10 mg iv x 1, stat portable CXR  r/o pneumothorax        Andrya Roppolo U Jackey Housey 10/23/2019, 4:25 AM

## 2019-10-23 NOTE — Progress Notes (Signed)
eLink Physician-Brief Progress Note Patient Name: Bruce Escobar DOB: August 17, 1965 MRN: [id number]   Date of Service  10/23/2019  HPI/Events of Note  Concern for sub-optimal patient analgesia.  eICU Interventions  Fentanyl infusion ceiling increased to 400 mcg and PRN fentanyl boluses for breakthrough pain ordered.        [name] [name] 10/23/2019, 2:41 AM

## 2019-10-23 NOTE — Progress Notes (Signed)
eLink Physician-Brief Progress Note Patient Name: Bruce Escobar DOB: 06/23/1965 MRN: [id number]   Date of Service  10/23/2019  HPI/Events of Note  Hypotension.  eICU Interventions  Ceiling on Levophed increased and Vasopressin ordered.        [name] Shakiah Wester 10/23/2019, 11:55 PM

## 2019-10-24 ENCOUNTER — Inpatient Hospital Stay (HOSPITAL_COMMUNITY): Payer: Medicaid Other

## 2019-10-24 DIAGNOSIS — U071 COVID-19: Secondary | ICD-10-CM

## 2019-10-24 DIAGNOSIS — J80 Acute respiratory distress syndrome: Secondary | ICD-10-CM | POA: Diagnosis not present

## 2019-10-24 DIAGNOSIS — R609 Edema, unspecified: Secondary | ICD-10-CM | POA: Diagnosis not present

## 2019-10-24 DIAGNOSIS — R7989 Other specified abnormal findings of blood chemistry: Secondary | ICD-10-CM | POA: Diagnosis not present

## 2019-10-24 LAB — GLUCOSE, CAPILLARY
Glucose-Capillary: 145 mg/dL — ABNORMAL HIGH (ref 70–99)
Glucose-Capillary: 154 mg/dL — ABNORMAL HIGH (ref 70–99)
Glucose-Capillary: 156 mg/dL — ABNORMAL HIGH (ref 70–99)
Glucose-Capillary: 158 mg/dL — ABNORMAL HIGH (ref 70–99)
Glucose-Capillary: 161 mg/dL — ABNORMAL HIGH (ref 70–99)
Glucose-Capillary: 168 mg/dL — ABNORMAL HIGH (ref 70–99)
Glucose-Capillary: 192 mg/dL — ABNORMAL HIGH (ref 70–99)

## 2019-10-24 LAB — TRIGLYCERIDES: Triglycerides: 150 mg/dL — ABNORMAL HIGH (ref <150)

## 2019-10-24 LAB — COMPREHENSIVE METABOLIC PANEL
ALT: 45 U/L — ABNORMAL HIGH (ref 0–44)
AST: 30 U/L (ref 15–41)
Albumin: 1.7 g/dL — ABNORMAL LOW (ref 3.5–5.0)
Alkaline Phosphatase: 88 U/L (ref 38–126)
Anion gap: 10 (ref 5–15)
BUN: 97 mg/dL — ABNORMAL HIGH (ref 6–20)
CO2: 31 mmol/L (ref 22–32)
Calcium: 7.6 mg/dL — ABNORMAL LOW (ref 8.9–10.3)
Chloride: 101 mmol/L (ref 98–111)
Creatinine, Ser: 2.93 mg/dL — ABNORMAL HIGH (ref 0.61–1.24)
GFR, Estimated: 23 mL/min — ABNORMAL LOW (ref >60)
Glucose, Bld: 163 mg/dL — ABNORMAL HIGH (ref 70–99)
Potassium: 4.7 mmol/L (ref 3.5–5.1)
Sodium: 142 mmol/L (ref 135–145)
Total Bilirubin: 1 mg/dL (ref 0.3–1.2)
Total Protein: 5.1 g/dL — ABNORMAL LOW (ref 6.5–8.1)

## 2019-10-24 LAB — CBC
HCT: 28.5 % — ABNORMAL LOW (ref 39.0–52.0)
Hemoglobin: 8.9 g/dL — ABNORMAL LOW (ref 13.0–17.0)
MCH: 31.3 pg (ref 26.0–34.0)
MCHC: 31.2 g/dL (ref 30.0–36.0)
MCV: 100.4 fL — ABNORMAL HIGH (ref 80.0–100.0)
Platelets: 166 10*3/uL (ref 150–400)
RBC: 2.84 MIL/uL — ABNORMAL LOW (ref 4.22–5.81)
RDW: 14.1 % (ref 11.5–15.5)
WBC: 21.5 10*3/uL — ABNORMAL HIGH (ref 4.0–10.5)
nRBC: 0 % (ref 0.0–0.2)

## 2019-10-24 LAB — URINE CULTURE: Culture: NO GROWTH

## 2019-10-24 LAB — PROTIME-INR
INR: 1.3 — ABNORMAL HIGH (ref 0.8–1.2)
Prothrombin Time: 15.2 seconds (ref 11.4–15.2)

## 2019-10-24 LAB — D-DIMER, QUANTITATIVE: D-Dimer, Quant: >20 ug/mL-FEU — ABNORMAL HIGH (ref 0.00–0.50)

## 2019-10-24 LAB — APTT: aPTT: 35 seconds (ref 24–36)

## 2019-10-24 LAB — LACTIC ACID, PLASMA: Lactic Acid, Venous: 1.2 mmol/L (ref 0.5–1.9)

## 2019-10-24 LAB — TROPONIN I (HIGH SENSITIVITY)
Troponin I (High Sensitivity): 34 ng/L — ABNORMAL HIGH (ref <18)
Troponin I (High Sensitivity): 36 ng/L — ABNORMAL HIGH (ref <18)

## 2019-10-24 LAB — PROCALCITONIN: Procalcitonin: 35.26 ng/mL

## 2019-10-24 MED ORDER — LACTATED RINGERS IV BOLUS
500.0000 mL | Freq: Once | INTRAVENOUS | Status: AC
  Administered 2019-10-24: 500 mL via INTRAVENOUS

## 2019-10-24 MED ORDER — VANCOMYCIN HCL 1250 MG/250ML IV SOLN
1250.0000 mg | INTRAVENOUS | Status: DC
  Administered 2019-10-24 – 2019-10-28 (×5): 1250 mg via INTRAVENOUS
  Filled 2019-10-24 (×6): qty 250

## 2019-10-24 NOTE — Progress Notes (Signed)
  Echocardiogram 2D Echocardiogram has been performed.  Bruce Escobar 10/24/2019, 5:02 PM

## 2019-10-24 NOTE — Consult Note (Signed)
WOC Nurse Consult Note: Reason for Consult:Blisers (serum filled) and discolorations to all 10 digits Wound type: perfusion is likely etiology. Patient is on vasopressors and has Covid 19 Pressure Injury POA: N/A Measurement:Patient with blisters on bilateral feet affecting all 10 digits Wound bed: intact, serum-filled blisters Drainage (amount, consistency, odor) N/A Periwound: deeper hues of red to blue are noted Dressing procedure/placement/frequency:  Discussion and collaboration with patient's Bedside RN, Marisue Humble is greatly appreciated. As noted above, patient is on vasopressors and has Covid 19, both with are known to effect perfusion in the most distal extremities. At this time, topical care guidance consists of cleansing and following with dressing the digits with an antimicrobial nonadherent, Xeroforn gauze topped with dry gauze and secured with Kerlix roll gauze/paper tape.  Feet will be placed in the previously provided bilateral Prevalon boots for floatation of the heel and protection of the digits.  Additional consultation if desired could be obtained from Vascular Surgery.  WOC nursing team will not follow, but will remain available to this patient, the nursing and medical teams.  Please re-consult if needed. Thanks, Ladona Mow, MSN, RN, GNP, [place]  Pager# [phone]

## 2019-10-24 NOTE — Progress Notes (Deleted)
Pt taken off Bipap to do mouth care and give MDI.  Pt tolerating well.  Will wait on RN to give night Meds to place back on Bipap QHS 

## 2019-10-24 NOTE — Progress Notes (Signed)
Pharmacy Antibiotic Note  Bruce Escobar is a 54 y.o. male admitted on Nov 01, 2019 with pneumonia.  Has been on cefepime, gram variable rod on previous gram stain  AKI - SCr 2.93, slightly down from yesterday  Height: 5\' 11"  (180.3 cm) Weight: 97.8 kg (215 lb 9.8 oz) IBW/kg (Calculated) : 75.3  Temp (24hrs), Avg:100.3 F (37.9 C), Min:98.7 F (37.1 C), Max:103.4 F (39.7 C)  Recent Labs  Lab 10/20/19 0330 10/20/19 0330 10/21/19 0250 10/21/19 0300 10/22/19 0254 10/22/19 2015 10/23/19 0312 10/23/19 0947 10/24/19 0253 10/24/19 0256  WBC 18.6*   < > 18.8*  --  16.8* 12.2* 14.2*  --   --  21.5*  CREATININE 2.25*  --   --  2.70* 3.36*  --  3.30*  --  2.93*  --   LATICACIDVEN  --   --   --   --   --   --  1.2 1.4 1.2  --    < > = values in this interval not displayed.    Estimated Creatinine Clearance: 34.8 mL/min (A) (by C-G formula based on SCr of 2.93 mg/dL (H)).    Allergies  Allergen Reactions  . Flagyl [Metronidazole]     Face flush  . Ibuprofen     Face Flush  . Penicillins     Rash   Plan: Continue cefepime 2 g q24h Add vanc 1250 mg q24h F/U cx, renal fx, lot, vanc lvls prn  12/24/19, PharmD, BCPS, BCCCP Clinical Pharmacist [phone]  Please check AMION for all [facility] Pharmacy numbers  10/24/2019 12:26 PM
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
## 2019-10-24 NOTE — Progress Notes (Addendum)
Patient Spratlin, [phone], has acute DVT in the right popliteal, posterior tibial, and peroneal veins, and in the left posterior tibial and peroneal veins. He also has acute DVT in the right subclavian, axillary, and brachial veins and in the left brachial veins.   Above per vascular tech  Previously on full dose lovenox -> then held due to trach bleeding. Now on proph dose with no trach bleeding x 24-48h  On levophed  ECHO 10/3 with RV strain +  Plan  - start Iv Heparin (HITT prob is low) - due to quick on/off mech - can consider full dose lovenox if bleeding does NOT return - repeach echo (reduced RV function 10/17/19) - if worse might need lysis keeping in mind recent trach bleed     SIGNATURE    Dr. Kalman Shan, M.D., F.C.C.P,  Pulmonary and Critical Care Medicine Staff Physician, [facility] Center Director - Interstitial Lung Disease  Program  Pulmonary Fibrosis [facility] at [facility] [place], [place], [address]  Pager: [phone], If no answer  OR between  19:00-7:00h: page [phone] Telephone (clinical office): [phone] Telephone (research): [phone]  3:19 PM 10/24/2019     SIGNATURE    Dr. Kalman Shan, M.D., F.C.C.P,  Pulmonary and Critical Care Medicine Staff Physician, [facility] Center Director - Interstitial Lung Disease  Program  Pulmonary Fibrosis [facility] at [facility] [place], [place], [address]  Pager: [phone], If no answer  OR between  19:00-7:00h: page [phone]  [phone] Telephone (clinical office): [phone] Telephone (research): [phone]  3:17 PM 10/24/2019
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
## 2019-10-24 NOTE — Progress Notes (Signed)
Spoke with patient's daughter, Wyn Forster. She was upset that she was not contacted first about this patient and asked to be the primary contact of her father. I also spoke to the mother of this patient and it seems that their family dynamic is complicated. Madison's number is in the chart. MD made aware of this and we have discussed doing a social work consult in the morning.

## 2019-10-24 NOTE — Progress Notes (Signed)
VASCULAR LAB    Bilateral upper and lower extremity venous duplex has been performed.  See CV proc for preliminary results.  Messaged Dr. Marchelle Gearing with results.  Tiler Brandis, RVT 10/24/2019, 2:10 PM

## 2019-10-24 NOTE — Progress Notes (Signed)
eLink Physician-Brief Progress Note Patient Name: Bruce Escobar DOB: 1965/05/15 MRN: [id number]   Date of Service  10/24/2019  HPI/Events of Note  Patient had a temp spike to 102 and is now diaphoretic.  eICU Interventions  Diaphoresis likely related to temp spike but will cycle Troponin and obtain a 12 lead EKG to r/o ACS.        [name] [name] 10/24/2019, 2:38 AM

## 2019-10-25 ENCOUNTER — Inpatient Hospital Stay (HOSPITAL_COMMUNITY): Payer: Medicaid Other

## 2019-10-25 DIAGNOSIS — U071 COVID-19: Secondary | ICD-10-CM | POA: Diagnosis not present

## 2019-10-25 DIAGNOSIS — J9601 Acute respiratory failure with hypoxia: Secondary | ICD-10-CM | POA: Diagnosis not present

## 2019-10-25 DIAGNOSIS — J80 Acute respiratory distress syndrome: Secondary | ICD-10-CM | POA: Diagnosis not present

## 2019-10-25 LAB — COMPREHENSIVE METABOLIC PANEL
ALT: 39 U/L (ref 0–44)
AST: 32 U/L (ref 15–41)
Albumin: 1.4 g/dL — ABNORMAL LOW (ref 3.5–5.0)
Alkaline Phosphatase: 110 U/L (ref 38–126)
Anion gap: 7 (ref 5–15)
BUN: 80 mg/dL — ABNORMAL HIGH (ref 6–20)
CO2: 34 mmol/L — ABNORMAL HIGH (ref 22–32)
Calcium: 8.2 mg/dL — ABNORMAL LOW (ref 8.9–10.3)
Chloride: 107 mmol/L (ref 98–111)
Creatinine, Ser: 2.21 mg/dL — ABNORMAL HIGH (ref 0.61–1.24)
GFR, Estimated: 33 mL/min — ABNORMAL LOW (ref >60)
Glucose, Bld: 125 mg/dL — ABNORMAL HIGH (ref 70–99)
Potassium: 4.3 mmol/L (ref 3.5–5.1)
Sodium: 148 mmol/L — ABNORMAL HIGH (ref 135–145)
Total Bilirubin: 0.7 mg/dL (ref 0.3–1.2)
Total Protein: 5.3 g/dL — ABNORMAL LOW (ref 6.5–8.1)

## 2019-10-25 LAB — GLUCOSE, CAPILLARY
Glucose-Capillary: 106 mg/dL — ABNORMAL HIGH (ref 70–99)
Glucose-Capillary: 111 mg/dL — ABNORMAL HIGH (ref 70–99)
Glucose-Capillary: 116 mg/dL — ABNORMAL HIGH (ref 70–99)
Glucose-Capillary: 116 mg/dL — ABNORMAL HIGH (ref 70–99)
Glucose-Capillary: 136 mg/dL — ABNORMAL HIGH (ref 70–99)
Glucose-Capillary: 154 mg/dL — ABNORMAL HIGH (ref 70–99)

## 2019-10-25 LAB — HEPARIN INDUCED PLATELET AB (HIT ANTIBODY): Heparin Induced Plt Ab: 0.142 OD (ref 0.000–0.400)

## 2019-10-25 LAB — CBC
HCT: 25.3 % — ABNORMAL LOW (ref 39.0–52.0)
Hemoglobin: 7.7 g/dL — ABNORMAL LOW (ref 13.0–17.0)
MCH: 30.7 pg (ref 26.0–34.0)
MCHC: 30.4 g/dL (ref 30.0–36.0)
MCV: 100.8 fL — ABNORMAL HIGH (ref 80.0–100.0)
Platelets: 192 10*3/uL (ref 150–400)
RBC: 2.51 MIL/uL — ABNORMAL LOW (ref 4.22–5.81)
RDW: 14.2 % (ref 11.5–15.5)
WBC: 19.7 10*3/uL — ABNORMAL HIGH (ref 4.0–10.5)
nRBC: 0 % (ref 0.0–0.2)

## 2019-10-25 LAB — PROTIME-INR
INR: 1.1 (ref 0.8–1.2)
Prothrombin Time: 13.9 seconds (ref 11.4–15.2)

## 2019-10-25 LAB — CULTURE, RESPIRATORY W GRAM STAIN: Culture: NORMAL

## 2019-10-25 LAB — PROCALCITONIN: Procalcitonin: 36.19 ng/mL

## 2019-10-25 LAB — HEPARIN ANTI-XA: Heparin LMW: <0.1 IU/mL

## 2019-10-25 LAB — MAGNESIUM: Magnesium: 2.2 mg/dL (ref 1.7–2.4)

## 2019-10-25 LAB — PHOSPHORUS: Phosphorus: 4 mg/dL (ref 2.5–4.6)

## 2019-10-25 LAB — TRIGLYCERIDES: Triglycerides: 136 mg/dL (ref <150)

## 2019-10-25 MED ORDER — SODIUM CHLORIDE 0.9 % IV SOLN
0.5000 mg/h | INTRAVENOUS | Status: DC
  Administered 2019-10-26: 5 mg/h via INTRAVENOUS
  Administered 2019-10-26: 7 mg/h via INTRAVENOUS
  Administered 2019-10-27: 8 mg/h via INTRAVENOUS
  Administered 2019-10-27: 9 mg/h via INTRAVENOUS
  Administered 2019-10-28: 8 mg/h via INTRAVENOUS
  Administered 2019-10-28: 4.5 mg/h via INTRAVENOUS
  Administered 2019-10-29 – 2019-11-01 (×3): 4 mg/h via INTRAVENOUS
  Administered 2019-11-02: 2 mg/h via INTRAVENOUS
  Administered 2019-11-03: 4 mg/h via INTRAVENOUS
  Filled 2019-10-25 (×4): qty 20
  Filled 2019-10-25: qty 10
  Filled 2019-10-25 (×14): qty 20

## 2019-10-25 MED ORDER — FUROSEMIDE 10 MG/ML IJ SOLN
40.0000 mg | INTRAMUSCULAR | Status: AC
  Administered 2019-10-25 (×2): 40 mg via INTRAVENOUS
  Filled 2019-10-25 (×2): qty 4

## 2019-10-25 MED ORDER — AMIODARONE LOAD VIA INFUSION
150.0000 mg | Freq: Once | INTRAVENOUS | Status: AC
  Administered 2019-10-25: 150 mg via INTRAVENOUS
  Filled 2019-10-25: qty 83.34

## 2019-10-25 MED ORDER — NUTRISOURCE FIBER PO PACK
1.0000 | PACK | Freq: Two times a day (BID) | ORAL | Status: DC
  Administered 2019-10-25 – 2019-11-03 (×18): 1
  Filled 2019-10-25 (×24): qty 1

## 2019-10-25 MED ORDER — AMIODARONE HCL IN DEXTROSE 360-4.14 MG/200ML-% IV SOLN
60.0000 mg/h | INTRAVENOUS | Status: DC
  Administered 2019-10-25: 60 mg/h via INTRAVENOUS
  Filled 2019-10-25: qty 200

## 2019-10-25 MED ORDER — AMIODARONE HCL IN DEXTROSE 360-4.14 MG/200ML-% IV SOLN
30.0000 mg/h | INTRAVENOUS | Status: DC
  Administered 2019-10-25 – 2019-10-27 (×4): 30 mg/h via INTRAVENOUS
  Filled 2019-10-25 (×3): qty 200

## 2019-10-25 MED ORDER — AMIODARONE HCL IN DEXTROSE 360-4.14 MG/200ML-% IV SOLN
INTRAVENOUS | Status: AC
  Administered 2019-10-25: 60 mg/h via INTRAVENOUS
  Filled 2019-10-25: qty 200

## 2019-10-25 MED ORDER — HEPARIN (PORCINE) 25000 UT/250ML-% IV SOLN
3300.0000 [IU]/h | INTRAVENOUS | Status: DC
  Administered 2019-10-26: 1700 [IU]/h via INTRAVENOUS
  Administered 2019-10-26: 1500 [IU]/h via INTRAVENOUS
  Administered 2019-10-27: 2500 [IU]/h via INTRAVENOUS
  Administered 2019-10-27: 1900 [IU]/h via INTRAVENOUS
  Administered 2019-10-28: 2750 [IU]/h via INTRAVENOUS
  Administered 2019-10-28: 3300 [IU]/h via INTRAVENOUS
  Administered 2019-10-28: 3000 [IU]/h via INTRAVENOUS
  Administered 2019-10-29 – 2019-11-04 (×19): 3300 [IU]/h via INTRAVENOUS
  Filled 2019-10-25 (×26): qty 250

## 2019-10-25 NOTE — Progress Notes (Addendum)
Patient's HR went up to 160s-170s. This happened 3 times, every time converting back into NSR. Obtained 12-lead EKG, which showed A-fib with RVR. MD made aware and ordered Amio. Gave Amio bolus and loading dose. Patients current HR IS 106 and all other VS stable. Will continue to monitor. Tan discharge was noted coming from his nose. TF were held and will be held overnight, and readdressed in the morning. NP made aware of this.

## 2019-10-25 NOTE — Progress Notes (Signed)
PT Cancellation Note  Patient Details Name: Bruce Escobar MRN: [id number] DOB: 1965/03/13   Cancelled Treatment:    Reason Eval/Treat Not Completed: Medical issues which prohibited therapy;Patient not medically ready.  Pt intubated, sedated.  Not ready yet. 10/25/2019  Jacinto Halim., PT Acute Rehabilitation Services [phone]  (pager) [phone]  (office)   [name] 10/25/2019, 3:16 PM

## 2019-10-25 NOTE — Progress Notes (Signed)
PCCM progress note  Spoke to patient's mother [name] this morning for update.  She was informed of patient's continued critical status and new development of multiple DVTs in all extremities.  She requested chaplain be called to pray with her son today.  She was informed that this will be coordinated and likely can occur today.  She also indicated she would like to keep patient's CODE STATUS of full code but understands if patient were to have cardiac arrest he would likely not survive.  She requests that one attempt to be made to resuscitate patient and if this failed she would like measures stopped and her son be allowed to pass peacefully.   Delfin Gant, NP-[facility] Contact / Pager information can be found on Amion  10/25/2019, 11:12 AM
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
## 2019-10-25 NOTE — Progress Notes (Signed)
SLP Cancellation Note  Patient Details Name: Bruce Escobar MRN: [id number] DOB: Aug 05, 1965   Cancelled treatment:        Not appropriate for inline valve- not following commands this am per RN   Roque Cash, Breck Coons 10/25/2019, 4:13 PM

## 2019-10-25 NOTE — Progress Notes (Signed)
Chaplains office received a message from patient's mother, [name], who lives in [place], asking for chaplain to offer prayer for the patient. [name] shared patient goes by Bruce Escobar and prefers chaplain to pray for Bruce Escobar. She shared patient has 2 daughters and the relationship with the family is strained.  [name] expressed worry over patient's soul that she believes he believes, but wants prayer for the patient.  Chaplain offered [name], empathetic/reflecive listening and words of comfort and support.  Chaplain went bedside with the patient and SN present.  Patient was not responsive, but Chaplain offered prayer for the patient and offered support to SN in the care for the patient.  Chaplain available as needed. Chaplain Agustin Cree, MDiv.    10/25/19 1900  Clinical Encounter Type  Visited With Patient;Health care provider  Visit Type Critical Care;Spiritual support  Referral From Family  Consult/Referral To Chaplain  Stress Factors  Family Stress Factors Major life changes;Family relationships;Health changes

## 2019-10-26 ENCOUNTER — Inpatient Hospital Stay (HOSPITAL_COMMUNITY): Payer: Medicaid Other

## 2019-10-26 DIAGNOSIS — U071 COVID-19: Secondary | ICD-10-CM | POA: Diagnosis not present

## 2019-10-26 DIAGNOSIS — J80 Acute respiratory distress syndrome: Secondary | ICD-10-CM | POA: Diagnosis not present

## 2019-10-26 DIAGNOSIS — J9601 Acute respiratory failure with hypoxia: Secondary | ICD-10-CM | POA: Diagnosis not present

## 2019-10-26 LAB — COMPREHENSIVE METABOLIC PANEL
ALT: 35 U/L (ref 0–44)
AST: 35 U/L (ref 15–41)
Albumin: 1.4 g/dL — ABNORMAL LOW (ref 3.5–5.0)
Alkaline Phosphatase: 99 U/L (ref 38–126)
Anion gap: 11 (ref 5–15)
BUN: 70 mg/dL — ABNORMAL HIGH (ref 6–20)
CO2: 33 mmol/L — ABNORMAL HIGH (ref 22–32)
Calcium: 8.2 mg/dL — ABNORMAL LOW (ref 8.9–10.3)
Chloride: 104 mmol/L (ref 98–111)
Creatinine, Ser: 2.22 mg/dL — ABNORMAL HIGH (ref 0.61–1.24)
GFR, Estimated: 33 mL/min — ABNORMAL LOW (ref >60)
Glucose, Bld: 94 mg/dL (ref 70–99)
Potassium: 3.7 mmol/L (ref 3.5–5.1)
Sodium: 148 mmol/L — ABNORMAL HIGH (ref 135–145)
Total Bilirubin: 0.5 mg/dL (ref 0.3–1.2)
Total Protein: 5.4 g/dL — ABNORMAL LOW (ref 6.5–8.1)

## 2019-10-26 LAB — CBC
HCT: 24.7 % — ABNORMAL LOW (ref 39.0–52.0)
Hemoglobin: 7.6 g/dL — ABNORMAL LOW (ref 13.0–17.0)
MCH: 30.9 pg (ref 26.0–34.0)
MCHC: 30.8 g/dL (ref 30.0–36.0)
MCV: 100.4 fL — ABNORMAL HIGH (ref 80.0–100.0)
Platelets: 185 10*3/uL (ref 150–400)
RBC: 2.46 MIL/uL — ABNORMAL LOW (ref 4.22–5.81)
RDW: 14.1 % (ref 11.5–15.5)
WBC: 20.3 10*3/uL — ABNORMAL HIGH (ref 4.0–10.5)
nRBC: 0 % (ref 0.0–0.2)

## 2019-10-26 LAB — CULTURE, RESPIRATORY W GRAM STAIN: Culture: NORMAL

## 2019-10-26 LAB — GLUCOSE, CAPILLARY
Glucose-Capillary: 107 mg/dL — ABNORMAL HIGH (ref 70–99)
Glucose-Capillary: 115 mg/dL — ABNORMAL HIGH (ref 70–99)
Glucose-Capillary: 139 mg/dL — ABNORMAL HIGH (ref 70–99)
Glucose-Capillary: 81 mg/dL (ref 70–99)
Glucose-Capillary: 82 mg/dL (ref 70–99)
Glucose-Capillary: 87 mg/dL (ref 70–99)

## 2019-10-26 LAB — HEPARIN LEVEL (UNFRACTIONATED)
Heparin Unfractionated: <0.1 IU/mL — ABNORMAL LOW (ref 0.30–0.70)
Heparin Unfractionated: <0.1 IU/mL — ABNORMAL LOW (ref 0.30–0.70)

## 2019-10-26 LAB — PHOSPHORUS: Phosphorus: 3.7 mg/dL (ref 2.5–4.6)

## 2019-10-26 LAB — ECHOCARDIOGRAM LIMITED
Height: 71 in
S' Lateral: 1.8 cm
Weight: 3449.76 oz

## 2019-10-26 LAB — PROCALCITONIN: Procalcitonin: 19.74 ng/mL

## 2019-10-26 MED ORDER — FUROSEMIDE 10 MG/ML IJ SOLN
40.0000 mg | INTRAMUSCULAR | Status: AC
  Administered 2019-10-26 (×2): 40 mg via INTRAVENOUS
  Filled 2019-10-26 (×2): qty 4

## 2019-10-26 MED ORDER — NOREPINEPHRINE 4 MG/250ML-% IV SOLN
INTRAVENOUS | Status: AC
  Filled 2019-10-26: qty 250

## 2019-10-26 MED ORDER — POLYETHYLENE GLYCOL 3350 17 G PO PACK: 17.0000 g | PACK | Freq: Every day | ORAL | Status: DC | PRN

## 2019-10-26 MED ORDER — POTASSIUM CHLORIDE 20 MEQ/15ML (10%) PO SOLN
40.0000 meq | Freq: Two times a day (BID) | ORAL | Status: AC
  Administered 2019-10-26 (×2): 40 meq via ORAL
  Filled 2019-10-26 (×2): qty 30

## 2019-10-26 NOTE — Progress Notes (Signed)
SLP Cancellation Note  Patient Details Name: THI SISEMORE MRN: [id number] DOB: 02-21-1965    Cancelled treatment:        Continues to require sedation. Will check chart in several days for appropriateness for speaking valve (inline with vent).    Royce Macadamia 10/26/2019, 8:12 AM  Breck Coons Lonell Face.Ed Nurse, children's [phone] Office [phone]
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
## 2019-10-26 NOTE — Progress Notes (Signed)
PT Cancellation Note  Patient Details Name: Bruce Escobar MRN: [id number] DOB: 1965/06/19   Cancelled Treatment:    Reason Eval/Treat Not Completed: Medical issues which prohibited therapy;Patient not medically ready.  Will sign off and await reorder when pt is appropriate for mobility evaluation. 10/26/2019  Jacinto Halim., PT Acute Rehabilitation Services [phone]  (pager) [phone]  (office)   [name] 10/26/2019, 9:48 AM

## 2019-10-26 NOTE — Progress Notes (Signed)
PCCM Progress Note   Mother again updated over the phone today with all questions answered.  She was again informed of patient's continued critical status. She endorsed appreciation of chaplain services pain with son yesterday evening.  Team will continue to update family daily  Delfin Gant, NP-C [facility] Contact / Pager information can be found on Amion  10/26/2019, 9:58 AM
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
## 2019-10-27 DIAGNOSIS — R4182 Altered mental status, unspecified: Secondary | ICD-10-CM | POA: Diagnosis not present

## 2019-10-27 DIAGNOSIS — U071 COVID-19: Secondary | ICD-10-CM | POA: Diagnosis not present

## 2019-10-27 LAB — CBC
HCT: 25.4 % — ABNORMAL LOW (ref 39.0–52.0)
Hemoglobin: 7.9 g/dL — ABNORMAL LOW (ref 13.0–17.0)
MCH: 31 pg (ref 26.0–34.0)
MCHC: 31.1 g/dL (ref 30.0–36.0)
MCV: 99.6 fL (ref 80.0–100.0)
Platelets: 238 10*3/uL (ref 150–400)
RBC: 2.55 MIL/uL — ABNORMAL LOW (ref 4.22–5.81)
RDW: 14.2 % (ref 11.5–15.5)
WBC: 24.8 10*3/uL — ABNORMAL HIGH (ref 4.0–10.5)
nRBC: 0 % (ref 0.0–0.2)

## 2019-10-27 LAB — VANCOMYCIN, TROUGH: Vancomycin Tr: 14 ug/mL — ABNORMAL LOW (ref 15–20)

## 2019-10-27 LAB — URINALYSIS, ROUTINE W REFLEX MICROSCOPIC
Bilirubin Urine: NEGATIVE
Glucose, UA: NEGATIVE mg/dL
Ketones, ur: NEGATIVE mg/dL
Leukocytes,Ua: NEGATIVE
Nitrite: NEGATIVE
Protein, ur: 30 mg/dL — AB
Specific Gravity, Urine: 1.016 (ref 1.005–1.030)
pH: 5 (ref 5.0–8.0)

## 2019-10-27 LAB — GLUCOSE, CAPILLARY
Glucose-Capillary: 119 mg/dL — ABNORMAL HIGH (ref 70–99)
Glucose-Capillary: 148 mg/dL — ABNORMAL HIGH (ref 70–99)
Glucose-Capillary: 130 mg/dL — ABNORMAL HIGH (ref 70–99)
Glucose-Capillary: 139 mg/dL — ABNORMAL HIGH (ref 70–99)
Glucose-Capillary: 144 mg/dL — ABNORMAL HIGH (ref 70–99)
Glucose-Capillary: 136 mg/dL — ABNORMAL HIGH (ref 70–99)

## 2019-10-27 LAB — BASIC METABOLIC PANEL
Anion gap: 8 (ref 5–15)
BUN: 69 mg/dL — ABNORMAL HIGH (ref 6–20)
CO2: 33 mmol/L — ABNORMAL HIGH (ref 22–32)
Calcium: 8.1 mg/dL — ABNORMAL LOW (ref 8.9–10.3)
Chloride: 109 mmol/L (ref 98–111)
Creatinine, Ser: 2.23 mg/dL — ABNORMAL HIGH (ref 0.61–1.24)
GFR, Estimated: 32 mL/min — ABNORMAL LOW (ref >60)
Glucose, Bld: 134 mg/dL — ABNORMAL HIGH (ref 70–99)
Potassium: 3.8 mmol/L (ref 3.5–5.1)
Sodium: 150 mmol/L — ABNORMAL HIGH (ref 135–145)

## 2019-10-27 LAB — HEPARIN LEVEL (UNFRACTIONATED)
Heparin Unfractionated: <0.1 IU/mL — ABNORMAL LOW (ref 0.30–0.70)
Heparin Unfractionated: 0.1 IU/mL — ABNORMAL LOW (ref 0.30–0.70)
Heparin Unfractionated: 0.13 IU/mL — ABNORMAL LOW (ref 0.30–0.70)

## 2019-10-27 LAB — PHOSPHORUS: Phosphorus: 3.5 mg/dL (ref 2.5–4.6)

## 2019-10-27 MED ORDER — AMIODARONE HCL 200 MG PO TABS: 200.0000 mg | ORAL_TABLET | Freq: Two times a day (BID) | ORAL | Status: DC

## 2019-10-27 MED ORDER — MIDODRINE HCL 5 MG PO TABS
10.0000 mg | ORAL_TABLET | Freq: Three times a day (TID) | ORAL | Status: DC
  Administered 2019-10-27 – 2019-11-04 (×24): 10 mg
  Filled 2019-10-27 (×24): qty 2

## 2019-10-27 MED ORDER — QUETIAPINE FUMARATE 50 MG PO TABS
100.0000 mg | ORAL_TABLET | Freq: Two times a day (BID) | ORAL | Status: DC
  Administered 2019-10-27 – 2019-10-30 (×8): 100 mg
  Filled 2019-10-27 (×7): qty 1
  Filled 2019-10-27: qty 2

## 2019-10-27 MED ORDER — FUROSEMIDE 10 MG/ML IJ SOLN
40.0000 mg | INTRAMUSCULAR | Status: AC
  Administered 2019-10-27 (×2): 40 mg via INTRAVENOUS
  Filled 2019-10-27 (×2): qty 4

## 2019-10-27 MED ORDER — OXYCODONE HCL 5 MG/5ML PO SOLN
10.0000 mg | Freq: Four times a day (QID) | ORAL | Status: DC
  Administered 2019-10-27 – 2019-10-28 (×5): 10 mg
  Filled 2019-10-27 (×5): qty 10

## 2019-10-27 MED ORDER — DIGOXIN 125 MCG PO TABS
0.1250 mg | ORAL_TABLET | Freq: Every day | ORAL | Status: DC
  Administered 2019-10-27 – 2019-10-31 (×5): 0.125 mg
  Filled 2019-10-27 (×6): qty 1

## 2019-10-27 MED ORDER — CLONAZEPAM 1 MG PO TABS
2.0000 mg | ORAL_TABLET | Freq: Two times a day (BID) | ORAL | Status: DC
  Administered 2019-10-27 – 2019-10-28 (×3): 2 mg
  Filled 2019-10-27 (×3): qty 2

## 2019-10-27 MED ORDER — AMIODARONE HCL 200 MG PO TABS: 200.0000 mg | ORAL_TABLET | Freq: Every day | ORAL | Status: DC

## 2019-10-27 MED ORDER — DEXTROSE 5 % IV SOLN: INTRAVENOUS | Status: DC

## 2019-10-27 MED ORDER — SODIUM CHLORIDE 0.9 % IV SOLN
2.0000 g | Freq: Two times a day (BID) | INTRAVENOUS | Status: AC
  Administered 2019-10-27 – 2019-10-30 (×7): 2 g via INTRAVENOUS
  Filled 2019-10-27 (×7): qty 2

## 2019-10-27 MED ORDER — ASPIRIN 81 MG PO CHEW
81.0000 mg | CHEWABLE_TABLET | Freq: Every day | ORAL | Status: DC
  Administered 2019-10-27 – 2019-11-04 (×9): 81 mg
  Filled 2019-10-27 (×9): qty 1

## 2019-10-27 MED ORDER — VITAL 1.5 CAL PO LIQD
1000.0000 mL | ORAL | Status: DC
  Administered 2019-10-27 – 2019-11-01 (×5): 1000 mL
  Filled 2019-10-27 (×7): qty 1000

## 2019-10-27 MED ORDER — POTASSIUM CHLORIDE 20 MEQ/15ML (10%) PO SOLN
40.0000 meq | Freq: Once | ORAL | Status: AC
  Administered 2019-10-27: 40 meq
  Filled 2019-10-27: qty 30
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
## 2019-10-27 NOTE — Progress Notes (Signed)
ANTICOAGULATION & ANTIBIOTIC CONSULT NOTE  Pharmacy Consult for Lovenox to heparin; Vancomycin and Cefepime Indication: DVT; HCAP  Allergies  Allergen Reactions  . Flagyl [Metronidazole]     Face flush  . Ibuprofen     Face Flush  . Penicillins     Rash    Patient Measurements: Height: 6' (182.9 cm) Weight: 93.1 kg (205 lb 4 oz) IBW/kg (Calculated) : 77.6 HEPARIN DW (KG): 81.6    Vital Signs: Temp: 99 F (37.2 C) (10/13 2000) Temp Source: Oral (10/13 2000) BP: 105/50 (10/13 2000) Pulse Rate: 103 (10/13 2000)  Labs: Recent Labs    10/25/19 0405 10/25/19 0405 10/25/19 1230 10/26/19 0424 10/26/19 0749 10/27/19 0350 10/27/19 1305 10/27/19 2115  HGB 7.7*   < >  --  7.6*  --  7.9*  --   --   HCT 25.3*  --   --  24.7*  --  25.4*  --   --   PLT 192  --   --  185  --  238  --   --   LABPROT 13.9  --   --   --   --   --   --   --   INR 1.1  --   --   --   --   --   --   --   HEPARINUNFRC  --   --   --   --    < > <0.10* 0.10* 0.13*  HEPRLOWMOCWT  --   --  <0.10  --   --   --   --   --   CREATININE 2.21*  --   --  2.22*  --  2.23*  --   --    < > = values in this interval not displayed.    Estimated Creatinine Clearance: 42 mL/min (A) (by C-G formula based on SCr of 2.23 mg/dL (H)).  Assessment: 54 yo male with DVT to start heparin. He has been on lovenox 92.5 mg [place] q24h with last dose given around noon today. He is noted with recently bleeding around the trach site (s/p DDAVP on 10/8). Also noted with COVID.   Hep lvl 0.13 - low  Goal of Therapy:  Heparin level 0.3-0.7 units/ml Monitor platelets by anticoagulation protocol: Yes   Plan:  Increase heparin to 2750 units/hr (no bolus due to recent bleeding) Repeat hep lvl in am  Elmer Sow, PharmD, BCPS, BCCCP Clinical Pharmacist [phone]  Please check AMION for all [facility] Pharmacy numbers  10/27/2019 9:52 PM

## 2019-10-27 NOTE — Progress Notes (Addendum)
eLink Physician-Brief Progress Note Patient Name: Bruce Escobar DOB: 1965/05/18 MRN: [id number]   Date of Service  10/27/2019  HPI/Events of Note  Fever sub-optimally responsive to anti-pyretics.  eICU Interventions  Cooling blanket ordered, as well as blood and respiratory cultures, urinalysis.        [name] [name] 10/27/2019, 12:03 AM

## 2019-10-27 NOTE — Progress Notes (Signed)
Nutrition Follow-up  DOCUMENTATION CODES:   Not applicable  INTERVENTION:   Continue tube feeding via OG tube: Vital 1.5 increase goal rate to 65 ml/hr (1560 ml per day) Prosource TF 90 ml BID  Provides 2500 kcal, 149 gm protein, 1192 ml free water daily  Free water flushes 300 ml via tube every 6 hours provides a total of 2392 ml free water daily.   NUTRITION DIAGNOSIS:   Increased nutrient needs related to  (COVID) as evidenced by estimated needs. Ongoing  GOAL:   Patient will meet greater than or equal to 90% of their needs Met with TF  MONITOR:   TF tolerance, Labs  REASON FOR ASSESSMENT:   Consult, Ventilator Enteral/tube feeding initiation and management  ASSESSMENT:   Pt with PMH of enlarged cardiac ventricle and GERD who was dx with COVID PNA at [facility] on 9/22. Pt ultimately left hospital AMA and then was admitted to [facility] 9/29 and was intubated and tx to [facility].   Patient remains intubated. S/P Cortrak placement with tip in the stomach 10/4. Rectal tube placed 10/5. S/P bronchoscopy 10/5. S/P tracheostomy 10/7.  TF was held 10/11 for suspected tube feeding coming out of nose. TF has been resumed and patient is tolerating well at this time. Currently receiving Vital 1.5 at 60 ml/h with Prosource TF 90 ml BID.  Patient remains on ventilator support via trach. MV: 11 L/min Temp (24hrs), Avg:100.2 F (37.9 C), Min:97.9 F (36.6 C), Max:102.8 F (39.3 C)   Propofol off.  Labs reviewed. Na 150, BUN 69, creat 2.23 CBG: 119-148  Medications reviewed and include levophed, nutrisource fiber, folic acid, novolog, lantus, tradjenta, thiamine.   IVF: NS at 10 ml/h  Current weight 93.1 kg Admission weight 85.3 kg I/O +9.6 L since admission UOP 4,000 ml x 24 hours Stool 100 ml x 24 hours  Diet Order:   Diet Order            Diet NPO time specified  Diet effective now                 EDUCATION NEEDS:   No education needs have been identified  at this time  Skin:  Skin Assessment: Skin Integrity Issues: Skin Integrity Issues:: DTI, Other (Comment) DTI: sacrum Other: blisters to all toes (likely related to poor perfusion)  Last BM:  10/13 rectal tube, type 7  Height:   Ht Readings from Last 1 Encounters:  10/27/19 6' (1.829 m)    Weight:   Wt Readings from Last 1 Encounters:  10/27/19 93.1 kg    BMI:  Body mass index is 27.84 kg/m.  Estimated Nutritional Needs:   Kcal:  1740-8144  Protein:  125-145 gm  Fluid:  > 2L/day   Lucas Mallow, RD, LDN, CNSC Please refer to Amion for contact information.

## 2019-10-28 LAB — CBC
HCT: 26 % — ABNORMAL LOW (ref 39.0–52.0)
Hemoglobin: 7.6 g/dL — ABNORMAL LOW (ref 13.0–17.0)
MCH: 30 pg (ref 26.0–34.0)
MCHC: 29.2 g/dL — ABNORMAL LOW (ref 30.0–36.0)
MCV: 102.8 fL — ABNORMAL HIGH (ref 80.0–100.0)
Platelets: 219 10*3/uL (ref 150–400)
RBC: 2.53 MIL/uL — ABNORMAL LOW (ref 4.22–5.81)
RDW: 14.5 % (ref 11.5–15.5)
WBC: 22.5 10*3/uL — ABNORMAL HIGH (ref 4.0–10.5)
nRBC: 0 % (ref 0.0–0.2)

## 2019-10-28 LAB — CULTURE, BLOOD (ROUTINE X 2): Culture: NO GROWTH

## 2019-10-28 LAB — COMPREHENSIVE METABOLIC PANEL
ALT: 30 U/L (ref 0–44)
AST: 31 U/L (ref 15–41)
Albumin: 1.3 g/dL — ABNORMAL LOW (ref 3.5–5.0)
Alkaline Phosphatase: 105 U/L (ref 38–126)
Anion gap: 10 (ref 5–15)
BUN: 65 mg/dL — ABNORMAL HIGH (ref 6–20)
CO2: 33 mmol/L — ABNORMAL HIGH (ref 22–32)
Calcium: 7.9 mg/dL — ABNORMAL LOW (ref 8.9–10.3)
Chloride: 107 mmol/L (ref 98–111)
Creatinine, Ser: 2.04 mg/dL — ABNORMAL HIGH (ref 0.61–1.24)
GFR, Estimated: 36 mL/min — ABNORMAL LOW (ref >60)
Glucose, Bld: 164 mg/dL — ABNORMAL HIGH (ref 70–99)
Potassium: 3.8 mmol/L (ref 3.5–5.1)
Sodium: 150 mmol/L — ABNORMAL HIGH (ref 135–145)
Total Bilirubin: 0.8 mg/dL (ref 0.3–1.2)
Total Protein: 5.5 g/dL — ABNORMAL LOW (ref 6.5–8.1)

## 2019-10-28 LAB — HEPARIN LEVEL (UNFRACTIONATED)
Heparin Unfractionated: 0.14 IU/mL — ABNORMAL LOW (ref 0.30–0.70)
Heparin Unfractionated: 0.21 IU/mL — ABNORMAL LOW (ref 0.30–0.70)
Heparin Unfractionated: 0.34 IU/mL (ref 0.30–0.70)

## 2019-10-28 LAB — GLUCOSE, CAPILLARY
Glucose-Capillary: 106 mg/dL — ABNORMAL HIGH (ref 70–99)
Glucose-Capillary: 120 mg/dL — ABNORMAL HIGH (ref 70–99)
Glucose-Capillary: 126 mg/dL — ABNORMAL HIGH (ref 70–99)
Glucose-Capillary: 127 mg/dL — ABNORMAL HIGH (ref 70–99)
Glucose-Capillary: 150 mg/dL — ABNORMAL HIGH (ref 70–99)
Glucose-Capillary: 184 mg/dL — ABNORMAL HIGH (ref 70–99)

## 2019-10-28 LAB — PHOSPHORUS: Phosphorus: 5.2 mg/dL — ABNORMAL HIGH (ref 2.5–4.6)

## 2019-10-28 MED ORDER — VALPROIC ACID 250 MG/5ML PO SOLN
500.0000 mg | Freq: Two times a day (BID) | ORAL | Status: DC
  Administered 2019-10-28 – 2019-11-01 (×10): 500 mg
  Filled 2019-10-28 (×11): qty 10

## 2019-10-28 MED ORDER — CLONAZEPAM 1 MG PO TABS
4.0000 mg | ORAL_TABLET | Freq: Two times a day (BID) | ORAL | Status: DC
  Administered 2019-10-28 – 2019-11-01 (×9): 4 mg
  Filled 2019-10-28 (×7): qty 4
  Filled 2019-10-28: qty 8
  Filled 2019-10-28: qty 4

## 2019-10-28 MED ORDER — FREE WATER
300.0000 mL | Status: DC
  Administered 2019-10-28 – 2019-10-31 (×18): 300 mL

## 2019-10-28 MED ORDER — OXYCODONE HCL 5 MG/5ML PO SOLN
20.0000 mg | Freq: Four times a day (QID) | ORAL | Status: DC
  Administered 2019-10-28 – 2019-10-29 (×3): 20 mg
  Filled 2019-10-28 (×4): qty 20

## 2019-10-28 NOTE — Progress Notes (Signed)
ANTICOAGULATION CONSULT NOTE  Pharmacy Consult for Heparin Indication: DVT  Allergies  Allergen Reactions  . Flagyl [Metronidazole]     Face flush  . Ibuprofen     Face Flush  . Penicillins     Rash    Patient Measurements: Height: 6' (182.9 cm) Weight: 93.5 kg (206 lb 2.1 oz) IBW/kg (Calculated) : 77.6 HEPARIN DW (KG): 81.6    Vital Signs: Temp: 99.1 F (37.3 C) (10/14 1923) Temp Source: Rectal (10/14 1923) BP: 115/61 (10/14 1830) Pulse Rate: 107 (10/14 1830)  Labs: Recent Labs    10/26/19 0424 10/26/19 0749 10/27/19 0350 10/27/19 1305 10/28/19 0415 10/28/19 1122 10/28/19 1830  HGB 7.6*   < > 7.9*  --  7.6*  --   --   HCT 24.7*  --  25.4*  --  26.0*  --   --   PLT 185  --  238  --  219  --   --   HEPARINUNFRC  --    < > <0.10*   < > 0.14* 0.21* 0.34  CREATININE 2.22*  --  2.23*  --  2.04*  --   --    < > = values in this interval not displayed.    Estimated Creatinine Clearance: 49.8 mL/min (A) (by C-G formula based on SCr of 2.04 mg/dL (H)).  Assessment: 54 yo male with DVT to start heparin. He has been on lovenox 92.5 mg [place] q24h with last dose given around noon today. He is noted with recently bleeding around the trach site (s/p DDAVP on 10/8). Also noted with COVID.   Heparin level is therapeutic (0.34) on 3300 units/hr.  No bleeding reported. CBC has been low-stable.   Goal of Therapy:  Heparin level 0.3-0.7 units/ml Monitor platelets by anticoagulation protocol: Yes   Plan:  Continue heparin at 3300 units/hr (no bolus due to recent bleeding) Follow-up AM heparin level and CBC  Anderson Malta, PharmD, BCPS, BCCCP Clinical Pharmacist Please refer to [facility] for [facility] Pharmacy numbers 10/28/2019 7:24 PM

## 2019-10-28 NOTE — Progress Notes (Addendum)
ANTICOAGULATION CONSULT NOTE  Pharmacy Consult for Heparin Indication: DVT  Allergies  Allergen Reactions  . Flagyl [Metronidazole]     Face flush  . Ibuprofen     Face Flush  . Penicillins     Rash    Patient Measurements: Height: 6' (182.9 cm) Weight: 93.5 kg (206 lb 2.1 oz) IBW/kg (Calculated) : 77.6 HEPARIN DW (KG): 81.6    Vital Signs: Temp: 99 F (37.2 C) (10/14 0300) Temp Source: Rectal (10/14 0300) BP: 100/52 (10/14 0500) Pulse Rate: 104 (10/14 0500)  Labs: Recent Labs    10/25/19 1230 10/26/19 0424 10/26/19 0749 10/27/19 0350 10/27/19 0350 10/27/19 1305 10/27/19 2115 10/28/19 0415  HGB  --  7.6*   < > 7.9*  --   --   --  7.6*  HCT  --  24.7*  --  25.4*  --   --   --  26.0*  PLT  --  185  --  238  --   --   --  219  HEPARINUNFRC  --   --    < > <0.10*   < > 0.10* 0.13* 0.14*  HEPRLOWMOCWT <0.10  --   --   --   --   --   --   --   CREATININE  --  2.22*  --  2.23*  --   --   --  2.04*   < > = values in this interval not displayed.    Estimated Creatinine Clearance: 49.8 mL/min (A) (by C-G formula based on SCr of 2.04 mg/dL (H)).  Assessment: 54 yo male with DVT to start heparin. He has been on lovenox 92.5 mg [place] q24h with last dose given around noon today. He is noted with recently bleeding around the trach site (s/p DDAVP on 10/8). Also noted with COVID.   Heparin level remains subtherapeutic (0.14) on gtt at 2750 units/hr. No real movement past dose increase last night. No issues with line or bleeding reported per RN. Hgb low but stable.  Goal of Therapy:  Heparin level 0.3-0.7 units/ml Monitor platelets by anticoagulation protocol: Yes   Plan:  Increase heparin to 3000 units/hr (no bolus due to recent bleeding) F/u 8 hr heparin level  Christoper Fabian, PharmD, BCPS Please see amion for complete clinical pharmacist phone list 10/28/2019 5:23 AM
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
## 2019-10-28 NOTE — Progress Notes (Signed)
Trach sutures removed per MD. No complications noted.

## 2019-10-28 NOTE — Progress Notes (Signed)
ANTICOAGULATION CONSULT NOTE  Pharmacy Consult for Heparin Indication: DVT  Allergies  Allergen Reactions  . Flagyl [Metronidazole]     Face flush  . Ibuprofen     Face Flush  . Penicillins     Rash    Patient Measurements: Height: 6' (182.9 cm) Weight: 93.5 kg (206 lb 2.1 oz) IBW/kg (Calculated) : 77.6 HEPARIN DW (KG): 81.6    Vital Signs: Temp: 99 F (37.2 C) (10/14 0800) Temp Source: Rectal (10/14 0800) BP: 84/38 (10/14 1000) Pulse Rate: 108 (10/14 1000)  Labs: Recent Labs    10/25/19 1230 10/26/19 0424 10/26/19 0749 10/27/19 0350 10/27/19 1305 10/27/19 2115 10/28/19 0415 10/28/19 1122  HGB  --  7.6*   < > 7.9*  --   --  7.6*  --   HCT  --  24.7*  --  25.4*  --   --  26.0*  --   PLT  --  185  --  238  --   --  219  --   HEPARINUNFRC  --   --    < > <0.10*   < > 0.13* 0.14* 0.21*  HEPRLOWMOCWT <0.10  --   --   --   --   --   --   --   CREATININE  --  2.22*  --  2.23*  --   --  2.04*  --    < > = values in this interval not displayed.    Estimated Creatinine Clearance: 49.8 mL/min (A) (by C-G formula based on SCr of 2.04 mg/dL (H)).  Assessment: 54 yo male with DVT to start heparin. He has been on lovenox 92.5 mg [place] q24h with last dose given around noon today. He is noted with recently bleeding around the trach site (s/p DDAVP on 10/8). Also noted with COVID.   Heparin level remains subtherapeutic (0.21) on gtt at 3000 units/hr. Slight improvement in levels overnight  Goal of Therapy:  Heparin level 0.3-0.7 units/ml Monitor platelets by anticoagulation protocol: Yes   Plan:  Increase heparin to 3300 units/hr (no bolus due to recent bleeding) Recheck at 1830 Will consider at3 level if still not therapeutic at next check d/t high unit/kg dosing  Elmer Sow, PharmD, BCPS, BCCCP Clinical Pharmacist [phone]  Please check AMION for all [facility] Pharm[facility] numbers  10/28/2019 12:26 PM

## 2019-10-29 DIAGNOSIS — R4182 Altered mental status, unspecified: Secondary | ICD-10-CM | POA: Diagnosis not present

## 2019-10-29 DIAGNOSIS — U071 COVID-19: Secondary | ICD-10-CM | POA: Diagnosis not present

## 2019-10-29 LAB — CBC
HCT: 23.1 % — ABNORMAL LOW (ref 39.0–52.0)
Hemoglobin: 6.6 g/dL — CL (ref 13.0–17.0)
MCH: 30.3 pg (ref 26.0–34.0)
MCHC: 28.6 g/dL — ABNORMAL LOW (ref 30.0–36.0)
MCV: 106 fL — ABNORMAL HIGH (ref 80.0–100.0)
Platelets: 217 10*3/uL (ref 150–400)
RBC: 2.18 MIL/uL — ABNORMAL LOW (ref 4.22–5.81)
RDW: 14.8 % (ref 11.5–15.5)
WBC: 21.9 10*3/uL — ABNORMAL HIGH (ref 4.0–10.5)
nRBC: 0 % (ref 0.0–0.2)

## 2019-10-29 LAB — CULTURE, RESPIRATORY W GRAM STAIN

## 2019-10-29 LAB — PREPARE RBC (CROSSMATCH)

## 2019-10-29 LAB — HEMOGLOBIN AND HEMATOCRIT, BLOOD
HCT: 24.3 % — ABNORMAL LOW (ref 39.0–52.0)
Hemoglobin: 7.3 g/dL — ABNORMAL LOW (ref 13.0–17.0)

## 2019-10-29 LAB — GLUCOSE, CAPILLARY
Glucose-Capillary: 129 mg/dL — ABNORMAL HIGH (ref 70–99)
Glucose-Capillary: 127 mg/dL — ABNORMAL HIGH (ref 70–99)
Glucose-Capillary: 130 mg/dL — ABNORMAL HIGH (ref 70–99)
Glucose-Capillary: 99 mg/dL (ref 70–99)
Glucose-Capillary: 118 mg/dL — ABNORMAL HIGH (ref 70–99)

## 2019-10-29 LAB — HEPARIN LEVEL (UNFRACTIONATED): Heparin Unfractionated: 0.34 IU/mL (ref 0.30–0.70)

## 2019-10-29 MED ORDER — MELATONIN 5 MG PO TABS
5.0000 mg | ORAL_TABLET | Freq: Every day | ORAL | Status: DC
  Administered 2019-10-29 – 2019-11-03 (×6): 5 mg
  Filled 2019-10-29 (×6): qty 1

## 2019-10-29 MED ORDER — OXYCODONE HCL 20 MG/ML PO CONC
20.0000 mg | Freq: Four times a day (QID) | ORAL | Status: DC
  Administered 2019-10-29 – 2019-11-04 (×24): 20 mg
  Filled 2019-10-29 (×24): qty 1

## 2019-10-29 MED ORDER — SODIUM CHLORIDE 0.9% IV SOLUTION: Freq: Once | INTRAVENOUS | Status: AC
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
## 2019-10-29 NOTE — Progress Notes (Signed)
eLink Physician-Brief Progress Note Patient Name: Bruce Escobar DOB: May 09, 1965 MRN: [id number]   Date of Service  10/29/2019  HPI/Events of Note  Hemoglobin 6.6 gm, down from baseline of 7.6 gm, no overt evidence of bleeding.  eICU Interventions  Transfuse one unit PRBC        Bruce Escobar 10/29/2019, 5:48 AM

## 2019-10-29 NOTE — Progress Notes (Signed)
ANTICOAGULATION CONSULT NOTE  Pharmacy Consult for Heparin Indication: DVT  Allergies  Allergen Reactions  . Flagyl [Metronidazole]     Face flush  . Ibuprofen     Face Flush  . Penicillins     Rash    Patient Measurements: Height: 6' (182.9 cm) Weight: 98.8 kg (217 lb 13 oz) IBW/kg (Calculated) : 77.6 HEPARIN DW (KG): 81.6    Vital Signs: Temp: 98.7 F (37.1 C) (10/15 0805) Temp Source: Rectal (10/15 0805) BP: 106/61 (10/15 0805) Pulse Rate: 95 (10/15 0805)  Labs: Recent Labs    10/27/19 0350 10/27/19 1305 10/28/19 0415 10/28/19 0415 10/28/19 1122 10/28/19 1830 10/29/19 0319  HGB 7.9*  --  7.6*  --   --   --  6.6*  HCT 25.4*  --  26.0*  --   --   --  23.1*  PLT 238  --  219  --   --   --  217  HEPARINUNFRC <0.10*   < > 0.14*   < > 0.21* 0.34 0.34  CREATININE 2.23*  --  2.04*  --   --   --   --    < > = values in this interval not displayed.    Estimated Creatinine Clearance: 51 mL/min (A) (by C-G formula based on SCr of 2.04 mg/dL (H)).  Assessment: 54 yo male with DVT to start heparin. He has been on lovenox 92.5 mg [place] q24h with last dose given around noon today. He is noted with recently bleeding around the trach site (s/p DDAVP on 10/8). Also noted with COVID.   Heparin level is therapeutic (0.34) on 3300 units/hr.  No bleeding reported. CBC has been low-stable.   Goal of Therapy:  Heparin level 0.3-0.7 units/ml Monitor platelets by anticoagulation protocol: Yes   Plan:  Continue heparin at 3300 units/hr (no bolus due to recent bleeding) Follow-up AM heparin level and CBC  Elmer Sow, PharmD, BCPS, BCCCP Clinical Pharmacist [phone]  Please check AMION for all [facility] numbers  10/29/2019 8:25 AM

## 2019-10-29 NOTE — Progress Notes (Signed)
SLP Cancellation Note  Patient Details Name: Bruce Escobar MRN: [id number] DOB: [birth year]   Cancelled treatment:       Reason Eval/Treat Not Completed: Medical issues which prohibited therapy. Per chart, pt still on vent and sedated. Will f/u for PMV readiness, either inline or if transitioning to TC.    Mahala Menghini., M.A. CCC-SLP Acute Rehabilitation Services Pager [phone] Office [phone]  10/29/2019, 7:30 AM

## 2019-10-29 NOTE — Progress Notes (Signed)
Assisted tele visit to patient with family member.  Finnigan Warriner, Nils Harold, RN  

## 2019-10-30 DIAGNOSIS — R4182 Altered mental status, unspecified: Secondary | ICD-10-CM | POA: Diagnosis not present

## 2019-10-30 DIAGNOSIS — U071 COVID-19: Secondary | ICD-10-CM | POA: Diagnosis not present

## 2019-10-30 LAB — CBC
HCT: 25.6 % — ABNORMAL LOW (ref 39.0–52.0)
Hemoglobin: 7.6 g/dL — ABNORMAL LOW (ref 13.0–17.0)
MCH: 30.3 pg (ref 26.0–34.0)
MCHC: 29.7 g/dL — ABNORMAL LOW (ref 30.0–36.0)
MCV: 102 fL — ABNORMAL HIGH (ref 80.0–100.0)
Platelets: 228 10*3/uL (ref 150–400)
RBC: 2.51 MIL/uL — ABNORMAL LOW (ref 4.22–5.81)
RDW: 15.1 % (ref 11.5–15.5)
WBC: 21 10*3/uL — ABNORMAL HIGH (ref 4.0–10.5)
nRBC: 0 % (ref 0.0–0.2)

## 2019-10-30 LAB — GLUCOSE, CAPILLARY
Glucose-Capillary: 109 mg/dL — ABNORMAL HIGH (ref 70–99)
Glucose-Capillary: 120 mg/dL — ABNORMAL HIGH (ref 70–99)
Glucose-Capillary: 120 mg/dL — ABNORMAL HIGH (ref 70–99)
Glucose-Capillary: 142 mg/dL — ABNORMAL HIGH (ref 70–99)
Glucose-Capillary: 145 mg/dL — ABNORMAL HIGH (ref 70–99)
Glucose-Capillary: 94 mg/dL (ref 70–99)
Glucose-Capillary: 95 mg/dL (ref 70–99)

## 2019-10-30 LAB — HEPARIN LEVEL (UNFRACTIONATED): Heparin Unfractionated: 0.42 IU/mL (ref 0.30–0.70)
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
## 2019-10-30 NOTE — Progress Notes (Signed)
ANTICOAGULATION CONSULT NOTE  Pharmacy Consult for Heparin Indication: DVT  Allergies  Allergen Reactions  . Flagyl [Metronidazole]     Face flush  . Ibuprofen     Face Flush  . Penicillins     Rash    Patient Measurements: Height: 6' (182.9 cm) Weight: 98.8 kg (217 lb 13 oz) IBW/kg (Calculated) : 77.6 HEPARIN DW (KG): 81.6    Vital Signs: Temp: 98.3 F (36.8 C) (10/16 0734) Temp Source: Core (10/16 0734) BP: 113/58 (10/16 1000) Pulse Rate: 105 (10/16 1000)  Labs: Recent Labs    10/28/19 0415 10/28/19 0415 10/28/19 1122 10/28/19 1830 10/29/19 0319 10/29/19 0319 10/29/19 1212 10/30/19 0327 10/30/19 0330  HGB 7.6*   < >  --   --  6.6*   < > 7.3*  --  7.6*  HCT 26.0*   < >  --   --  23.1*  --  24.3*  --  25.6*  PLT 219  --   --   --  217  --   --   --  228  HEPARINUNFRC 0.14*  --    < > 0.34 0.34  --   --  0.42  --   CREATININE 2.04*  --   --   --   --   --   --   --   --    < > = values in this interval not displayed.    Estimated Creatinine Clearance: 51 mL/min (A) (by C-G formula based on SCr of 2.04 mg/dL (H)).  Assessment: 54 yo male with DVT to start heparin. He has been on lovenox 92.5 mg [place] q24h with last dose given around noon today. He is noted with recently bleeding around the trach site (s/p DDAVP on 10/8). Also noted with COVID.   Heparin level is therapeutic (0.42) on 3300 units/hr.  No bleeding reported. CBC has been low-stable.   Goal of Therapy:  Heparin level 0.3-0.7 units/ml Monitor platelets by anticoagulation protocol: Yes   Plan:  Continue heparin at 3300 units/hr (no bolus due to recent bleeding) Follow-up AM heparin level and CBC  Jeanella Cara, PharmD, [facility] Clinical Pharmacist Please see AMION for all Pharmacists' Contact Phone Numbers 10/30/2019, 10:19 AM

## 2019-10-31 ENCOUNTER — Inpatient Hospital Stay: Payer: Self-pay

## 2019-10-31 DIAGNOSIS — J9601 Acute respiratory failure with hypoxia: Secondary | ICD-10-CM | POA: Diagnosis not present

## 2019-10-31 DIAGNOSIS — U071 COVID-19: Secondary | ICD-10-CM | POA: Diagnosis not present

## 2019-10-31 LAB — CBC
HCT: 25 % — ABNORMAL LOW (ref 39.0–52.0)
Hemoglobin: 7.4 g/dL — ABNORMAL LOW (ref 13.0–17.0)
MCH: 30.5 pg (ref 26.0–34.0)
MCHC: 29.6 g/dL — ABNORMAL LOW (ref 30.0–36.0)
MCV: 102.9 fL — ABNORMAL HIGH (ref 80.0–100.0)
Platelets: 238 10*3/uL (ref 150–400)
RBC: 2.43 MIL/uL — ABNORMAL LOW (ref 4.22–5.81)
RDW: 14.8 % (ref 11.5–15.5)
WBC: 15.4 10*3/uL — ABNORMAL HIGH (ref 4.0–10.5)
nRBC: 0 % (ref 0.0–0.2)

## 2019-10-31 LAB — GLUCOSE, CAPILLARY
Glucose-Capillary: 121 mg/dL — ABNORMAL HIGH (ref 70–99)
Glucose-Capillary: 108 mg/dL — ABNORMAL HIGH (ref 70–99)
Glucose-Capillary: 117 mg/dL — ABNORMAL HIGH (ref 70–99)
Glucose-Capillary: 158 mg/dL — ABNORMAL HIGH (ref 70–99)
Glucose-Capillary: 119 mg/dL — ABNORMAL HIGH (ref 70–99)
Glucose-Capillary: 151 mg/dL — ABNORMAL HIGH (ref 70–99)

## 2019-10-31 LAB — HEPARIN LEVEL (UNFRACTIONATED): Heparin Unfractionated: 0.43 IU/mL (ref 0.30–0.70)

## 2019-10-31 MED ORDER — FUROSEMIDE 10 MG/ML IJ SOLN
40.0000 mg | Freq: Four times a day (QID) | INTRAMUSCULAR | Status: AC
  Administered 2019-10-31 (×2): 40 mg via INTRAVENOUS
  Filled 2019-10-31 (×2): qty 4

## 2019-10-31 MED ORDER — FREE WATER
300.0000 mL | Status: DC
  Administered 2019-10-31 – 2019-11-02 (×16): 300 mL

## 2019-10-31 MED ORDER — QUETIAPINE FUMARATE 50 MG PO TABS
150.0000 mg | ORAL_TABLET | Freq: Two times a day (BID) | ORAL | Status: DC
  Administered 2019-10-31 – 2019-11-04 (×9): 150 mg
  Filled 2019-10-31 (×9): qty 1
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
## 2019-10-31 NOTE — Progress Notes (Signed)
ANTICOAGULATION CONSULT NOTE  Pharmacy Consult for Heparin Indication: DVT  Allergies  Allergen Reactions  . Flagyl [Metronidazole]     Face flush  . Ibuprofen     Face Flush  . Penicillins     Rash    Patient Measurements: Height: 6' (182.9 cm) Weight: 99.7 kg (219 lb 12.8 oz) IBW/kg (Calculated) : 77.6 HEPARIN DW (KG): 81.6    Vital Signs: Temp: 98.5 F (36.9 C) (10/17 0700) Temp Source: Rectal (10/17 0700) BP: 103/67 (10/17 0700) Pulse Rate: 103 (10/17 0700)  Labs: Recent Labs    10/29/19 0319 10/29/19 0319 10/29/19 1212 10/29/19 1212 10/30/19 0327 10/30/19 0330 10/31/19 0411  HGB 6.6*   < > 7.3*   < >  --  7.6* 7.4*  HCT 23.1*   < > 24.3*  --   --  25.6* 25.0*  PLT 217  --   --   --   --  228 238  HEPARINUNFRC 0.34  --   --   --  0.42  --  0.43   < > = values in this interval not displayed.    Estimated Creatinine Clearance: 51.2 mL/min (A) (by C-G formula based on SCr of 2.04 mg/dL (H)).  Assessment: 54 yo male with DVT to start heparin. He has been on lovenox 92.5 mg [place] q24h with last dose given around noon today. He is noted with recently bleeding around the trach site (s/p DDAVP on 10/8). Also noted with COVID.   Heparin level is therapeutic (0.43) on 3300 units/hr.  No bleeding reported. CBC has been low-stable.   Goal of Therapy:  Heparin level 0.3-0.7 units/ml Monitor platelets by anticoagulation protocol: Yes   Plan:  Continue heparin at 3300 units/hr (no bolus due to recent bleeding) Follow-up AM heparin level and CBC  Jeanella Cara, PharmD, [facility] Clinical Pharmacist Please see AMION for all Pharmacists' Contact Phone Numbers 10/31/2019, 7:53 AM

## 2019-10-31 NOTE — Progress Notes (Signed)
Vascular US of upper extremity results DVT bilateral UE.  Dr Kendrick Fries notified.  Orders to cancel PICC placement.  Recommend referral to IR for central placement.

## 2019-11-01 ENCOUNTER — Inpatient Hospital Stay (HOSPITAL_COMMUNITY): Payer: Medicaid Other

## 2019-11-01 DIAGNOSIS — L8990 Pressure ulcer of unspecified site, unspecified stage: Secondary | ICD-10-CM | POA: Insufficient documentation

## 2019-11-01 DIAGNOSIS — J80 Acute respiratory distress syndrome: Secondary | ICD-10-CM | POA: Diagnosis not present

## 2019-11-01 DIAGNOSIS — J9601 Acute respiratory failure with hypoxia: Secondary | ICD-10-CM | POA: Diagnosis not present

## 2019-11-01 DIAGNOSIS — U071 COVID-19: Secondary | ICD-10-CM | POA: Diagnosis not present

## 2019-11-01 LAB — GLUCOSE, CAPILLARY
Glucose-Capillary: 139 mg/dL — ABNORMAL HIGH (ref 70–99)
Glucose-Capillary: 124 mg/dL — ABNORMAL HIGH (ref 70–99)
Glucose-Capillary: 181 mg/dL — ABNORMAL HIGH (ref 70–99)
Glucose-Capillary: 169 mg/dL — ABNORMAL HIGH (ref 70–99)
Glucose-Capillary: 149 mg/dL — ABNORMAL HIGH (ref 70–99)

## 2019-11-01 LAB — BASIC METABOLIC PANEL
Anion gap: 7 (ref 5–15)
Anion gap: 9 (ref 5–15)
BUN: 95 mg/dL — ABNORMAL HIGH (ref 6–20)
BUN: 98 mg/dL — ABNORMAL HIGH (ref 6–20)
CO2: 28 mmol/L (ref 22–32)
CO2: 28 mmol/L (ref 22–32)
Calcium: 8.2 mg/dL — ABNORMAL LOW (ref 8.9–10.3)
Calcium: 8.3 mg/dL — ABNORMAL LOW (ref 8.9–10.3)
Chloride: 110 mmol/L (ref 98–111)
Chloride: 107 mmol/L (ref 98–111)
Creatinine, Ser: 3.24 mg/dL — ABNORMAL HIGH (ref 0.61–1.24)
Creatinine, Ser: 3.29 mg/dL — ABNORMAL HIGH (ref 0.61–1.24)
GFR, Estimated: 21 mL/min — ABNORMAL LOW (ref >60)
GFR, Estimated: 20 mL/min — ABNORMAL LOW (ref >60)
Glucose, Bld: 206 mg/dL — ABNORMAL HIGH (ref 70–99)
Glucose, Bld: 229 mg/dL — ABNORMAL HIGH (ref 70–99)
Potassium: 5.3 mmol/L — ABNORMAL HIGH (ref 3.5–5.1)
Potassium: 5.2 mmol/L — ABNORMAL HIGH (ref 3.5–5.1)
Sodium: 145 mmol/L (ref 135–145)
Sodium: 144 mmol/L (ref 135–145)

## 2019-11-01 LAB — CBC WITH DIFFERENTIAL/PLATELET
Abs Immature Granulocytes: 0.18 10*3/uL — ABNORMAL HIGH (ref 0.00–0.07)
Basophils Absolute: 0.1 10*3/uL (ref 0.0–0.1)
Basophils Relative: 0 %
Eosinophils Absolute: 0.2 10*3/uL (ref 0.0–0.5)
Eosinophils Relative: 1 %
HCT: 24.8 % — ABNORMAL LOW (ref 39.0–52.0)
Hemoglobin: 7.3 g/dL — ABNORMAL LOW (ref 13.0–17.0)
Immature Granulocytes: 1 %
Lymphocytes Relative: 2 %
Lymphs Abs: 0.4 10*3/uL — ABNORMAL LOW (ref 0.7–4.0)
MCH: 31.5 pg (ref 26.0–34.0)
MCHC: 29.4 g/dL — ABNORMAL LOW (ref 30.0–36.0)
MCV: 106.9 fL — ABNORMAL HIGH (ref 80.0–100.0)
Monocytes Absolute: 0.9 10*3/uL (ref 0.1–1.0)
Monocytes Relative: 6 %
Neutro Abs: 13.7 10*3/uL — ABNORMAL HIGH (ref 1.7–7.7)
Neutrophils Relative %: 90 %
Platelets: 234 10*3/uL (ref 150–400)
RBC: 2.32 MIL/uL — ABNORMAL LOW (ref 4.22–5.81)
RDW: 15.1 % (ref 11.5–15.5)
WBC: 15.3 10*3/uL — ABNORMAL HIGH (ref 4.0–10.5)
nRBC: 0 % (ref 0.0–0.2)

## 2019-11-01 LAB — COMPREHENSIVE METABOLIC PANEL
ALT: 24 U/L (ref 0–44)
AST: 18 U/L (ref 15–41)
Albumin: 1.2 g/dL — ABNORMAL LOW (ref 3.5–5.0)
Alkaline Phosphatase: 81 U/L (ref 38–126)
Anion gap: 8 (ref 5–15)
BUN: 92 mg/dL — ABNORMAL HIGH (ref 6–20)
CO2: 29 mmol/L (ref 22–32)
Calcium: 8.3 mg/dL — ABNORMAL LOW (ref 8.9–10.3)
Chloride: 110 mmol/L (ref 98–111)
Creatinine, Ser: 3.23 mg/dL — ABNORMAL HIGH (ref 0.61–1.24)
GFR, Estimated: 21 mL/min — ABNORMAL LOW (ref >60)
Glucose, Bld: 142 mg/dL — ABNORMAL HIGH (ref 70–99)
Potassium: 5.4 mmol/L — ABNORMAL HIGH (ref 3.5–5.1)
Sodium: 147 mmol/L — ABNORMAL HIGH (ref 135–145)
Total Bilirubin: 0.4 mg/dL (ref 0.3–1.2)
Total Protein: 5.8 g/dL — ABNORMAL LOW (ref 6.5–8.1)

## 2019-11-01 LAB — CULTURE, BLOOD (ROUTINE X 2)
Culture: NO GROWTH
Culture: NO GROWTH
Special Requests: ADEQUATE
Special Requests: ADEQUATE

## 2019-11-01 LAB — HEPARIN LEVEL (UNFRACTIONATED)
Heparin Unfractionated: <0.1 IU/mL — ABNORMAL LOW (ref 0.30–0.70)
Heparin Unfractionated: <0.1 IU/mL — ABNORMAL LOW (ref 0.30–0.70)
Heparin Unfractionated: 0.58 IU/mL (ref 0.30–0.70)

## 2019-11-01 LAB — PROCALCITONIN: Procalcitonin: 8.11 ng/mL

## 2019-11-01 MED ORDER — SODIUM ZIRCONIUM CYCLOSILICATE 5 G PO PACK
10.0000 g | PACK | Freq: Three times a day (TID) | ORAL | Status: AC
  Administered 2019-11-01 (×3): 10 g
  Filled 2019-11-01 (×3): qty 2

## 2019-11-01 MED ORDER — DIGOXIN 0.0625 MG HALF TABLET
0.0625 mg | ORAL_TABLET | Freq: Every day | ORAL | Status: DC
  Administered 2019-11-01 – 2019-11-03 (×3): 0.0625 mg
  Filled 2019-11-01 (×4): qty 1

## 2019-11-01 MED ORDER — LINEZOLID 600 MG/300ML IV SOLN
600.0000 mg | Freq: Two times a day (BID) | INTRAVENOUS | Status: DC
  Administered 2019-11-01 – 2019-11-03 (×6): 600 mg via INTRAVENOUS
  Filled 2019-11-01 (×9): qty 300

## 2019-11-01 MED ORDER — SODIUM CHLORIDE 0.9 % IV SOLN
2.0000 g | INTRAVENOUS | Status: AC
  Administered 2019-11-01: 2 g via INTRAVENOUS
  Filled 2019-11-01: qty 2

## 2019-11-01 MED ORDER — SODIUM CHLORIDE 0.9 % IV SOLN
1.0000 g | Freq: Two times a day (BID) | INTRAVENOUS | Status: DC
  Administered 2019-11-01 – 2019-11-04 (×6): 1 g via INTRAVENOUS
  Filled 2019-11-01 (×9): qty 1

## 2019-11-01 NOTE — Progress Notes (Signed)
ANTICOAGULATION CONSULT NOTE  Pharmacy Consult for Heparin Indication: DVT  Allergies  Allergen Reactions  . Flagyl [Metronidazole]     Face flush  . Ibuprofen     Face Flush  . Penicillins     Rash    Patient Measurements: Height: 6' (182.9 cm) Weight: 99.7 kg (219 lb 12.8 oz) IBW/kg (Calculated) : 77.6 HEPARIN DW (KG): 81.6  Vital Signs: Temp: 98.6 F (37 C) (10/18 1500) Temp Source: Axillary (10/18 1500) BP: 111/52 (10/18 1715) Pulse Rate: 101 (10/18 1715)  Labs: Recent Labs    10/30/19 0327 10/30/19 0330 10/31/19 0411 10/31/19 0411 11/01/19 0500 11/01/19 0920 11/01/19 1201 11/01/19 1700  HGB   < > 7.6* 7.4*  --  7.3*  --   --   --   HCT  --  25.6* 25.0*  --  24.8*  --   --   --   PLT  --  228 238  --  234  --   --   --   HEPARINUNFRC   < >  --  0.43   < > <0.10* <0.10*  --  0.58  CREATININE  --   --   --   --  3.23*  --  3.24* 3.29*   < > = values in this interval not displayed.    Estimated Creatinine Clearance: 31.7 mL/min (A) (by C-G formula based on SCr of 3.29 mg/dL (H)).  Assessment: 54 yr old male with DVT on IV heparin. He was on lovenox 92.5 mg [place] q24h with last dose given on 10/25/19. He is noted with recently bleeding around the trach site (s/p DDAVP on 10/8). Also noted with COVID.   Heparin level this morning was undetectable (HL<0.1 units/ml) x 2. The patient had previously been therapeutic on the current rate for >72 hrs. This AM, the heparin infusion was changed from the L-PIV to the central line, and heparin infusion rate was not adjusted, as the pharmacist was not convinced that heparin was infusing appropriately in the L-PIV, reviewed the site with the RN and puffy and some redness, concerning for infiltration.   Heparin level ~5 hrs after heparin infusion site was changed to central line was 0.58 units/ml, which is within the goal range for this pt. H/H, platelets stable. Per RN, no issues with IV or bleeding observed.  Goal of Therapy:   Heparin level 0.3-0.7 units/ml Monitor platelets by anticoagulation protocol: Yes   Plan:  Continue heparin at 3300 units/hr  Check confirmatory heparin level in 6 hrs Monitor daily heparin level, CBC Monitor for signs/symptoms of bleeding  Thank you for allowing pharmacy to be a part of this patient's care.  Vicki Mallet, PharmD, BCPS, [facility] Clinical Pharmacist 11/01/2019 5:47 PM

## 2019-11-01 NOTE — Progress Notes (Signed)
CSW received consult for patient to identify NOK/decision maker. Per chart review, the patient has an adult daughter and both of his parents are living Per [place] law, the patient's daughter is able to be the main decision maker for the patient, if desired.  Edwin Dada, MSW, LCSW-A Transitions of Care  Clinical Social Worker  [facility] Emergency Departments  Medical ICU [phone]

## 2019-11-01 NOTE — Progress Notes (Signed)
Pharmacy Antibiotic Note  Bruce Escobar is a 54 y.o. male admitted on 24-Oct-2019 with ARDS in the setting of a recent COVID infection with a prolonged stay. Concern for HCAP so pharmacy consulted to start Meropenem along with Zyvox per MD.   The patient is noted to have AKI with SCr trend up to 3.23 << 2.04 (10/14)  Plan: - Meropenem 2g IV x 1 to load followed by 1g IV every 12 hours - Noted interaction with valproic acid - appears to have been added 10/14 to help with sedation and not for seizure control - Zyvox 600 mg IV every 12 hours per MD - Will continue to follow renal function, culture results, LOT, and antibiotic de-escalation plans   Height: 6' (182.9 cm) Weight: 99.7 kg (219 lb 12.8 oz) IBW/kg (Calculated) : 77.6  Temp (24hrs), Avg:98.8 F (37.1 C), Min:98.5 F (36.9 C), Max:99.5 F (37.5 C)  Recent Labs  Lab 10/26/19 0424 10/26/19 0424 10/27/19 0350 10/27/19 0350 10/27/19 1305 10/28/19 0415 10/29/19 0319 10/30/19 0330 10/31/19 0411 11/01/19 0500  WBC 20.3*   < > 24.8*   < >  --  22.5* 21.9* 21.0* 15.4* 15.3*  CREATININE 2.22*  --  2.23*  --   --  2.04*  --   --   --  3.23*  VANCOTROUGH  --   --   --   --  14*  --   --   --   --   --    < > = values in this interval not displayed.    Estimated Creatinine Clearance: 32.3 mL/min (A) (by C-G formula based on SCr of 3.23 mg/dL (H)).    Allergies  Allergen Reactions  . Flagyl [Metronidazole]     Face flush  . Ibuprofen     Face Flush  . Penicillins     Rash    Antimicrobials this admission: Vanc 9/30 >>10/1; 10/10>>10/14 *10/13 VT = 14 on 1250 q24, continue will accumulate Cefepime 9/30 >>10/4; 10/9>>10/16 Baricitinib 9/30>>10/1 (held due to LFTs) Remdesivir 9/23>>9/24, 9/29>>10/1 (stopped 1 dose early due to LFTs) Azith 9/29 x1  CTX 9/29 x1 Meropenem 10/18 >> Zyvox 10/18 >>  Microbiology results: 9/30 mrsa pcr - neg 9/30 BCx - neg 9/30 TA - neg 10/9 blood x 2 - no growth 10/9 urine - no  growth 10/9 resp> normal flora 10/13 resp (TA) >> corynebacterium striatum 10/13 BCx >> NGf 10/18 RCx >>  Thank you for allowing pharmacy to be a part of this patient's care.  Georgina Pillion, PharmD, BCPS Clinical Pharmacist Clinical phone for 11/01/2019: [phone] 11/01/2019 10:30 AM   **Pharmacist phone directory can now be found on [email or web address] ([email or web address]).  Listed under [facility].

## 2019-11-01 NOTE — Progress Notes (Signed)
Cortrak Tube Team Note:  Received page from RN regarding concern that bridle is too tight and causing pressure injury.  RD assessed patient and bridle twisted with clip touching tip of patient's nose. Bridle was imbedded in nose causing pressure injury.   RD removed current bridle; new bridle placed. Bridle more lose and not resting in pressure injury.  Discussed importance of bridle care with RN and frequent checks to ensure that bridle not causing further harm. RN to notify Cortrak team if further issues arise.   Romelle Starcher MS, RDN, LDN, CNSC Registered Dietitian III Clinical Nutrition RD Pager and On-Call Pager Number Located in [place]
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
## 2019-11-01 NOTE — Progress Notes (Signed)
ANTICOAGULATION CONSULT NOTE  Pharmacy Consult for Heparin Indication: DVT  Allergies  Allergen Reactions  . Flagyl [Metronidazole]     Face flush  . Ibuprofen     Face Flush  . Penicillins     Rash    Patient Measurements: Height: 6' (182.9 cm) Weight: 99.7 kg (219 lb 12.8 oz) IBW/kg (Calculated) : 77.6 HEPARIN DW (KG): 81.6    Vital Signs: Temp: 98.6 F (37 C) (10/18 0700) Temp Source: Rectal (10/18 0700) BP: 102/53 (10/18 0829) Pulse Rate: 101 (10/18 0829)  Labs: Recent Labs    10/30/19 0327 10/30/19 0330 10/31/19 0411 11/01/19 0500 11/01/19 0920  HGB   < > 7.6* 7.4* 7.3*  --   HCT  --  25.6* 25.0* 24.8*  --   PLT  --  228 238 234  --   HEPARINUNFRC   < >  --  0.43 <0.10* <0.10*  CREATININE  --   --   --  3.23*  --    < > = values in this interval not displayed.    Estimated Creatinine Clearance: 32.3 mL/min (A) (by C-G formula based on SCr of 3.23 mg/dL (H)).  Assessment: 55 yo male with DVT to start heparin. He has been on lovenox 92.5 mg [place] q24h with last dose given around noon today. He is noted with recently bleeding around the trach site (s/p DDAVP on 10/8). Also noted with COVID.   Heparin level this morning was undetectable (HL<0.1) x 2. The patient had previously been therapeutic on the current rate for >72h. Discussed with the RN and will change the heparin infusion from the L-PIV to the central line. Will not adjust the rate at this time as I am not convinced that it is infusing appropriately in the L-PIV, reviewed the site with the RN and puffy and some redness, concerning for infiltration.   Goal of Therapy:  Heparin level 0.3-0.7 units/ml Monitor platelets by anticoagulation protocol: Yes   Plan:  - Continue heparin at 3300 units/hr - change from PIV to central line - Will continue to monitor for any signs/symptoms of bleeding and will follow up with heparin level in 6 hours from transfer to alternative line.   Thank you for allowing  pharmacy to be a part of this patient's care.  Georgina Pillion, PharmD, BCPS Clinical Pharmacist Clinical phone for 11/01/2019: [phone] 11/01/2019 10:13 AM   **Pharmacist phone directory can now be found on [email or web address] ([email or web address]).  Listed under [facility].

## 2019-11-02 ENCOUNTER — Inpatient Hospital Stay (HOSPITAL_COMMUNITY): Payer: Medicaid Other

## 2019-11-02 DIAGNOSIS — U071 COVID-19: Secondary | ICD-10-CM | POA: Diagnosis not present

## 2019-11-02 DIAGNOSIS — J80 Acute respiratory distress syndrome: Secondary | ICD-10-CM | POA: Diagnosis not present

## 2019-11-02 DIAGNOSIS — J9601 Acute respiratory failure with hypoxia: Secondary | ICD-10-CM | POA: Diagnosis not present

## 2019-11-02 LAB — BPAM RBC
Blood Product Expiration Date: 202111182359
Blood Product Expiration Date: 202111182359
ISSUE DATE / TIME: 202110150742
Unit Type and Rh: 5100
Unit Type and Rh: 5100

## 2019-11-02 LAB — TYPE AND SCREEN
ABO/RH(D): O POS
Antibody Screen: NEGATIVE
Unit division: 0
Unit division: 0

## 2019-11-02 LAB — BASIC METABOLIC PANEL
Anion gap: 11 (ref 5–15)
Anion gap: 11 (ref 5–15)
BUN: 104 mg/dL — ABNORMAL HIGH (ref 6–20)
BUN: 111 mg/dL — ABNORMAL HIGH (ref 6–20)
CO2: 26 mmol/L (ref 22–32)
CO2: 25 mmol/L (ref 22–32)
Calcium: 8.2 mg/dL — ABNORMAL LOW (ref 8.9–10.3)
Calcium: 8.1 mg/dL — ABNORMAL LOW (ref 8.9–10.3)
Chloride: 106 mmol/L (ref 98–111)
Chloride: 106 mmol/L (ref 98–111)
Creatinine, Ser: 3.58 mg/dL — ABNORMAL HIGH (ref 0.61–1.24)
Creatinine, Ser: 3.77 mg/dL — ABNORMAL HIGH (ref 0.61–1.24)
GFR, Estimated: 18 mL/min — ABNORMAL LOW (ref >60)
GFR, Estimated: 17 mL/min — ABNORMAL LOW (ref >60)
Glucose, Bld: 173 mg/dL — ABNORMAL HIGH (ref 70–99)
Glucose, Bld: 137 mg/dL — ABNORMAL HIGH (ref 70–99)
Potassium: 4.7 mmol/L (ref 3.5–5.1)
Potassium: 5.1 mmol/L (ref 3.5–5.1)
Sodium: 143 mmol/L (ref 135–145)
Sodium: 142 mmol/L (ref 135–145)

## 2019-11-02 LAB — GLUCOSE, CAPILLARY
Glucose-Capillary: 116 mg/dL — ABNORMAL HIGH (ref 70–99)
Glucose-Capillary: 126 mg/dL — ABNORMAL HIGH (ref 70–99)
Glucose-Capillary: 127 mg/dL — ABNORMAL HIGH (ref 70–99)
Glucose-Capillary: 132 mg/dL — ABNORMAL HIGH (ref 70–99)
Glucose-Capillary: 147 mg/dL — ABNORMAL HIGH (ref 70–99)
Glucose-Capillary: 153 mg/dL — ABNORMAL HIGH (ref 70–99)
Glucose-Capillary: 201 mg/dL — ABNORMAL HIGH (ref 70–99)

## 2019-11-02 LAB — POCT I-STAT 7, (LYTES, BLD GAS, ICA,H+H)
Acid-Base Excess: 3 mmol/L — ABNORMAL HIGH (ref 0.0–2.0)
Bicarbonate: 30.9 mmol/L — ABNORMAL HIGH (ref 20.0–28.0)
Calcium, Ion: 1.25 mmol/L (ref 1.15–1.40)
HCT: 21 % — ABNORMAL LOW (ref 39.0–52.0)
Hemoglobin: 7.1 g/dL — ABNORMAL LOW (ref 13.0–17.0)
O2 Saturation: 100 %
Patient temperature: 98.6
Potassium: 4.8 mmol/L (ref 3.5–5.1)
Sodium: 144 mmol/L (ref 135–145)
TCO2: 33 mmol/L — ABNORMAL HIGH (ref 22–32)
pCO2 arterial: 66.7 mmHg (ref 32.0–48.0)
pH, Arterial: 7.273 — ABNORMAL LOW (ref 7.350–7.450)
pO2, Arterial: 271 mmHg — ABNORMAL HIGH (ref 83.0–108.0)

## 2019-11-02 LAB — HEPARIN LEVEL (UNFRACTIONATED)
Heparin Unfractionated: 0.61 IU/mL (ref 0.30–0.70)
Heparin Unfractionated: 0.59 IU/mL (ref 0.30–0.70)

## 2019-11-02 LAB — CBC
HCT: 22 % — ABNORMAL LOW (ref 39.0–52.0)
Hemoglobin: 6.4 g/dL — CL (ref 13.0–17.0)
MCH: 30.3 pg (ref 26.0–34.0)
MCHC: 29.1 g/dL — ABNORMAL LOW (ref 30.0–36.0)
MCV: 104.3 fL — ABNORMAL HIGH (ref 80.0–100.0)
Platelets: 238 10*3/uL (ref 150–400)
RBC: 2.11 MIL/uL — ABNORMAL LOW (ref 4.22–5.81)
RDW: 15.1 % (ref 11.5–15.5)
WBC: 10.4 10*3/uL (ref 4.0–10.5)
nRBC: 0 % (ref 0.0–0.2)

## 2019-11-02 LAB — PROCALCITONIN: Procalcitonin: 7.95 ng/mL

## 2019-11-02 LAB — PREPARE RBC (CROSSMATCH)

## 2019-11-02 MED ORDER — FUROSEMIDE 10 MG/ML IJ SOLN
80.0000 mg | Freq: Three times a day (TID) | INTRAMUSCULAR | Status: AC
  Administered 2019-11-02 – 2019-11-03 (×3): 80 mg via INTRAVENOUS
  Filled 2019-11-02 (×3): qty 8

## 2019-11-02 MED ORDER — SODIUM CHLORIDE 0.9% IV SOLUTION: Freq: Once | INTRAVENOUS | Status: AC

## 2019-11-02 MED ORDER — VALPROIC ACID 250 MG/5ML PO SOLN
750.0000 mg | Freq: Two times a day (BID) | ORAL | Status: DC
  Administered 2019-11-02 – 2019-11-04 (×5): 750 mg
  Filled 2019-11-02 (×5): qty 15

## 2019-11-02 MED ORDER — CLONAZEPAM 1 MG PO TABS
4.0000 mg | ORAL_TABLET | Freq: Two times a day (BID) | ORAL | Status: DC
  Administered 2019-11-02 – 2019-11-04 (×5): 4 mg
  Filled 2019-11-02: qty 4
  Filled 2019-11-02: qty 8
  Filled 2019-11-02: qty 4
  Filled 2019-11-02: qty 8
  Filled 2019-11-02: qty 4

## 2019-11-02 MED ORDER — PROSOURCE TF PO LIQD
45.0000 mL | Freq: Three times a day (TID) | ORAL | Status: DC
  Administered 2019-11-02 – 2019-11-04 (×6): 45 mL
  Filled 2019-11-02 (×6): qty 45

## 2019-11-02 MED ORDER — ALBUMIN HUMAN 25 % IV SOLN
25.0000 g | Freq: Three times a day (TID) | INTRAVENOUS | Status: AC
  Administered 2019-11-02 – 2019-11-03 (×3): 25 g via INTRAVENOUS
  Filled 2019-11-02 (×3): qty 100

## 2019-11-02 MED ORDER — FREE WATER
300.0000 mL | Freq: Four times a day (QID) | Status: DC
  Administered 2019-11-02 – 2019-11-04 (×8): 300 mL

## 2019-11-02 MED ORDER — ROCURONIUM BROMIDE 10 MG/ML (PF) SYRINGE
5.0000 mg | PREFILLED_SYRINGE | Freq: Once | INTRAVENOUS | Status: AC
  Administered 2019-11-02: 5 mg via INTRAVENOUS

## 2019-11-02 MED ORDER — VITAL 1.5 CAL PO LIQD
1000.0000 mL | ORAL | Status: DC
  Administered 2019-11-03 (×2): 1000 mL
  Filled 2019-11-02 (×2): qty 1000

## 2019-11-02 MED ORDER — ROCURONIUM BROMIDE 10 MG/ML (PF) SYRINGE
PREFILLED_SYRINGE | INTRAVENOUS | Status: AC
  Filled 2019-11-02: qty 10
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
## 2019-11-02 NOTE — Progress Notes (Signed)
ANTICOAGULATION CONSULT NOTE  Pharmacy Consult for Heparin Indication: DVT  Assessment: 54 yr old male with DVT on IV heparin. He was on lovenox 92.5 mg [place] q24h with last dose given on 10/25/19. He is noted with recently bleeding around the trach site (s/p DDAVP on 10/8). Also noted with COVID.   Heparin infusion switched to CL.  Heparin level 0.61 units/ml  Goal of Therapy:  Heparin level 0.3-0.7 units/ml Monitor platelets by anticoagulation protocol: Yes   Plan:  Continue heparin at 3300 units/hr  Monitor daily heparin level, CBC Monitor for signs/symptoms of bleeding  Thank you for allowing pharmacy to be a part of this patient's care.  Talbert Cage, PharmD Clinical Pharmacist 11/02/2019 2:24 AM

## 2019-11-02 NOTE — Progress Notes (Signed)
Long d/w patients family. Re: goals of care Plan Cont current rx No escalation No HD DNR  Planning on changing to ATC and comfort in next 24-48 hrs  Simonne Martinet ACNP-BC [facility] Pulmonary/Critical Care Pager # [phone] OR # [phone] if no answer

## 2019-11-02 NOTE — Progress Notes (Signed)
Nutrition Follow-up  DOCUMENTATION CODES:   Severe malnutrition in context of acute illness/injury  INTERVENTION:   Continue tube feeding via OG tube: -Vital 1.5 increase goal rate to 70 ml/hr (1680 ml per day) -Prosource TF 45 ml TID  Provides 2640 kcal, 146 gm protein, 1284 ml free water daily  Free water flushes 300 ml via tube every 6 hours provides a total of 2484 ml free water daily.  NUTRITION DIAGNOSIS:   Severe Malnutrition related to acute illness (COVID 19) as evidenced by mild fat depletion, moderate muscle depletion.  Ongoing  GOAL:   Patient will meet greater than or equal to 90% of their needs  Addressed via TF  MONITOR:   Vent status, Labs, I & O's, Skin, TF tolerance, Weight trends  REASON FOR ASSESSMENT:   Consult, Ventilator Enteral/tube feeding initiation and management  ASSESSMENT:   Pt with PMH of enlarged cardiac ventricle and GERD who was dx with COVID PNA at [facility] on 9/22. Pt ultimately left hospital AMA and then was admitted to [facility] 9/29 and was intubated and tx to [facility].   10/5- intubated 10/7- trach  Remains on full vent support. Required NMB last night due to agitation. Renal function worsening, +20L since admit. Attempting lasix/albumin. Might not be candidate for HD or CRRT. Hypernatremia improved with free water flushes. Nose bleed this am. Noted new wound in nose, bridle replaced yesterday. Adjust tube feeding to better meet needs.   Admission weight: 85.3 kg  Current weight: 99.7 kg   Patient requiring ventilator support via trach MV: 12.6 L/min Temp (24hrs), Avg:98.8 F (37.1 C), Min:98.6 F (37 C), Max:98.9 F (37.2 C)   UOP: 450 ml x 24 hrs   Drips: albumin Medications: folic acid, 80 mg lasix BID, SS novolog, lantus, tradjenta, thiamine Labs: Cr 3.58- up from yesterday CBG 95-229  NUTRITION - FOCUSED PHYSICAL EXAM:    Most Recent Value  Orbital Region Mild depletion  Upper Arm Region Mild depletion   Thoracic and Lumbar Region Unable to assess  Buccal Region Moderate depletion  Temple Region Severe depletion  Clavicle Bone Region Moderate depletion  Clavicle and Acromion Bone Region Moderate depletion  Scapular Bone Region Unable to assess  Dorsal Hand No depletion  Patellar Region No depletion  Anterior Thigh Region No depletion  Posterior Calf Region No depletion  Edema (RD Assessment) Mild  Hair Reviewed  Eyes Unable to assess  Mouth Unable to assess  Skin Reviewed  Nails Reviewed     Diet Order:   Diet Order            Diet NPO time specified  Diet effective now                 EDUCATION NEEDS:   Not appropriate for education at this time  Skin:  Skin Assessment: Skin Integrity Issues: Skin Integrity Issues:: DTI, Other (Comment) Stg II: L nare  DTI: sacrum Pressure Injury: lateral back Other: blisters to all toes (likely related to poor perfusion)  Last BM:  10/18 via rectal tube  Height:   Ht Readings from Last 1 Encounters:  10/28/19 6' (1.829 m)    Weight:   Wt Readings from Last 1 Encounters:  10/31/19 99.7 kg    BMI:  Body mass index is 29.81 kg/m.  Estimated Nutritional Needs:   Kcal:  2400-2800  Protein:  125-145 gm  Fluid:  > 2L/day  Vanessa Kick RD, LDN Clinical Nutrition Pager listed in AMION

## 2019-11-02 NOTE — Progress Notes (Signed)
eLink Physician-Brief Progress Note Patient Name: Bruce Escobar DOB: 01/15/1965 MRN: [id number]   Date of Service  11/02/2019  HPI/Events of Note  Camera eval: Agitation on simple touching, moving him. - fenta and versed max dose increased. Gettingg frequent versed push also.   Hg < 7.  NGtube side bleeding. Trach no bleeding. . On heparin , PTT at range. Platelets normal.   eICU Interventions  - 1 PRBC, follow post Hg/hct - fenta/versed gtt max dose adjusted upwards. - watch for any worsening of epistaxis, mostly mucosal from NG irritation  While on heparin.      Intervention Category Intermediate Interventions: Bleeding - evaluation and treatment with blood products;Other:  Ranee Gosselin 11/02/2019, 5:10 AM

## 2019-11-02 NOTE — Progress Notes (Signed)
SLP Cancellation Note  Patient Details Name: BERGEN MAGNER MRN: [id number] DOB: April 20, 1965   Cancelled treatment:        SLP following pt for appropriateness for inline PMV. Noted pt agitated this am and sedatives given. Will continue to follow.    Royce Macadamia 11/02/2019, 8:39 AM   Breck Coons Lonell Face.Ed Nurse, children's [phone] Office [phone]

## 2019-11-02 NOTE — Progress Notes (Signed)
eLink Physician-Brief Progress Note Patient Name: CARMEL WADDINGTON DOB: 10-07-1965 MRN: [id number]   Date of Service  11/02/2019  HPI/Events of Note  Camera: asynchrony on and off, got more prn fenta/versed pushes. Getting PRBC. sats 99%, PIP 30. Air entry bilaterally ok. Double triggering on and off.   eICU Interventions  - get CxR/ABG. Look for atelectasis, mucous plugging.  - Rocuronium low dose 5 mg once stat.      Intervention Category Intermediate Interventions: Respiratory distress - evaluation and management  Ranee Gosselin 11/02/2019, 6:06 AM

## 2019-11-02 NOTE — Progress Notes (Signed)
Mother contact information while in town [phone]

## 2019-11-03 DIAGNOSIS — U071 COVID-19: Secondary | ICD-10-CM | POA: Diagnosis not present

## 2019-11-03 DIAGNOSIS — J9601 Acute respiratory failure with hypoxia: Secondary | ICD-10-CM | POA: Diagnosis not present

## 2019-11-03 DIAGNOSIS — E43 Unspecified severe protein-calorie malnutrition: Secondary | ICD-10-CM | POA: Insufficient documentation

## 2019-11-03 LAB — BASIC METABOLIC PANEL
Anion gap: 11 (ref 5–15)
Anion gap: 11 (ref 5–15)
BUN: 111 mg/dL — ABNORMAL HIGH (ref 6–20)
BUN: 117 mg/dL — ABNORMAL HIGH (ref 6–20)
CO2: 24 mmol/L (ref 22–32)
CO2: 25 mmol/L (ref 22–32)
Calcium: 8 mg/dL — ABNORMAL LOW (ref 8.9–10.3)
Calcium: 7.9 mg/dL — ABNORMAL LOW (ref 8.9–10.3)
Chloride: 105 mmol/L (ref 98–111)
Chloride: 101 mmol/L (ref 98–111)
Creatinine, Ser: 3.89 mg/dL — ABNORMAL HIGH (ref 0.61–1.24)
Creatinine, Ser: 4.2 mg/dL — ABNORMAL HIGH (ref 0.61–1.24)
GFR, Estimated: 17 mL/min — ABNORMAL LOW (ref >60)
GFR, Estimated: 15 mL/min — ABNORMAL LOW (ref >60)
Glucose, Bld: 110 mg/dL — ABNORMAL HIGH (ref 70–99)
Glucose, Bld: 145 mg/dL — ABNORMAL HIGH (ref 70–99)
Potassium: 5 mmol/L (ref 3.5–5.1)
Potassium: 5.6 mmol/L — ABNORMAL HIGH (ref 3.5–5.1)
Sodium: 140 mmol/L (ref 135–145)
Sodium: 137 mmol/L (ref 135–145)

## 2019-11-03 LAB — CULTURE, RESPIRATORY W GRAM STAIN: Culture: NORMAL

## 2019-11-03 LAB — CBC
HCT: 21.5 % — ABNORMAL LOW (ref 39.0–52.0)
Hemoglobin: 6.7 g/dL — CL (ref 13.0–17.0)
MCH: 30.9 pg (ref 26.0–34.0)
MCHC: 31.2 g/dL (ref 30.0–36.0)
MCV: 99.1 fL (ref 80.0–100.0)
Platelets: 245 10*3/uL (ref 150–400)
RBC: 2.17 MIL/uL — ABNORMAL LOW (ref 4.22–5.81)
RDW: 16.2 % — ABNORMAL HIGH (ref 11.5–15.5)
WBC: 11.2 10*3/uL — ABNORMAL HIGH (ref 4.0–10.5)
nRBC: 0.5 % — ABNORMAL HIGH (ref 0.0–0.2)

## 2019-11-03 LAB — TYPE AND SCREEN
ABO/RH(D): O POS
Antibody Screen: NEGATIVE
Unit division: 0

## 2019-11-03 LAB — GLUCOSE, CAPILLARY
Glucose-Capillary: 91 mg/dL (ref 70–99)
Glucose-Capillary: 120 mg/dL — ABNORMAL HIGH (ref 70–99)
Glucose-Capillary: 162 mg/dL — ABNORMAL HIGH (ref 70–99)
Glucose-Capillary: 152 mg/dL — ABNORMAL HIGH (ref 70–99)
Glucose-Capillary: 133 mg/dL — ABNORMAL HIGH (ref 70–99)
Glucose-Capillary: 117 mg/dL — ABNORMAL HIGH (ref 70–99)
Glucose-Capillary: 141 mg/dL — ABNORMAL HIGH (ref 70–99)

## 2019-11-03 LAB — BPAM RBC
Blood Product Expiration Date: 202111182359
ISSUE DATE / TIME: 202110190751
Unit Type and Rh: 5100

## 2019-11-03 LAB — HEPARIN LEVEL (UNFRACTIONATED): Heparin Unfractionated: 0.29 IU/mL — ABNORMAL LOW (ref 0.30–0.70)

## 2019-11-03 LAB — PROCALCITONIN: Procalcitonin: 8.16 ng/mL

## 2019-11-03 MED ORDER — ALBUMIN HUMAN 25 % IV SOLN
25.0000 g | Freq: Three times a day (TID) | INTRAVENOUS | Status: AC
  Administered 2019-11-03 – 2019-11-04 (×3): 25 g via INTRAVENOUS
  Filled 2019-11-03 (×3): qty 100

## 2019-11-03 MED ORDER — FUROSEMIDE 10 MG/ML IJ SOLN
120.0000 mg | Freq: Three times a day (TID) | INTRAVENOUS | Status: AC
  Administered 2019-11-03 – 2019-11-04 (×2): 120 mg via INTRAVENOUS
  Filled 2019-11-03: qty 10
  Filled 2019-11-03: qty 12
  Filled 2019-11-03: qty 10
  Filled 2019-11-03 (×2): qty 12

## 2019-11-03 MED ORDER — SODIUM ZIRCONIUM CYCLOSILICATE 5 G PO PACK
10.0000 g | PACK | Freq: Once | ORAL | Status: AC
  Administered 2019-11-03: 10 g
  Filled 2019-11-03: qty 2

## 2019-11-03 MED ORDER — DEXTROSE 50 % IV SOLN
INTRAVENOUS | Status: AC
  Filled 2019-11-03: qty 50
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
## 2019-11-03 NOTE — Progress Notes (Signed)
Speech Language Pathology Discharge Patient Details Name: Bruce Escobar MRN: [id number] DOB: 12-02-1965 Today's Date: 11/03/2019 Time:  -     Patient discharged from SLP services secondary to pt now comfort care- he is also heavily sedated  Please see latest therapy progress note for current level of functioning and progress toward goals.    Progress and discharge plan discussed with patient and/or caregiver: Patient unable to participate in discharge planning and no caregivers available  GO     Royce Macadamia 11/03/2019, 1:15 PM   Breck Coons Lonell Face.Ed Nurse, children's [phone] Office [phone]

## 2019-11-04 LAB — CBC
HCT: 19.4 % — ABNORMAL LOW (ref 39.0–52.0)
Hemoglobin: 6 g/dL — CL (ref 13.0–17.0)
MCH: 30.6 pg (ref 26.0–34.0)
MCHC: 30.9 g/dL (ref 30.0–36.0)
MCV: 99 fL (ref 80.0–100.0)
Platelets: 247 10*3/uL (ref 150–400)
RBC: 1.96 MIL/uL — ABNORMAL LOW (ref 4.22–5.81)
RDW: 15.9 % — ABNORMAL HIGH (ref 11.5–15.5)
WBC: 9.4 10*3/uL (ref 4.0–10.5)
nRBC: 0.6 % — ABNORMAL HIGH (ref 0.0–0.2)

## 2019-11-04 LAB — HEPARIN LEVEL (UNFRACTIONATED): Heparin Unfractionated: 0.38 IU/mL (ref 0.30–0.70)

## 2019-11-04 LAB — GLUCOSE, CAPILLARY
Glucose-Capillary: 139 mg/dL — ABNORMAL HIGH (ref 70–99)
Glucose-Capillary: 116 mg/dL — ABNORMAL HIGH (ref 70–99)

## 2019-11-15 NOTE — Progress Notes (Signed)
Pt placed on 40% ATC per comfort care orders. Family at bedside.
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
## 2019-11-15 NOTE — Progress Notes (Signed)
ANTICOAGULATION CONSULT NOTE  Pharmacy Consult for Heparin Indication: DVT  Assessment: 54 yr old male with DVT on IV heparin. He was on lovenox 92.5 mg Brandon q24h with last dose given on 10/25/19. He is noted with recently bleeding around the trach site (s/p DDAVP on 10/8). Also noted with COVID.   Heparin level 0.38 units/ml.  Patient will likely transition to comfort care soon.  Goal of Therapy:  Heparin level 0.3-0.7 units/ml Monitor platelets by anticoagulation protocol: Yes   Plan:  Continue heparin at 3300 units/hr until decision made to transition to comfort, then would d/c due to lack of monitoring. Monitor daily heparin level, CBC Monitor for signs/symptoms of bleeding  Thank you for allowing pharmacy to be a part of this patients care.  Jeanella Cara, PharmD, [facility] Clinical Pharmacist Please see AMION for all Pharmacists' Contact Phone Numbers 10/19/2019, 7:14 AM

## 2019-11-15 NOTE — Progress Notes (Signed)
Pt expired at 1057. E-link RN notified CDS.

## 2019-11-15 NOTE — Progress Notes (Signed)
   18-Nov-2019 1015  Clinical Encounter Type  Visited With Patient and family together  Visit Type Patient actively dying  Referral From Nurse  Consult/Referral To Chaplain  Chaplain offered comfort and prayer to patient, his mother and sister.This note was prepared by Deneen Harts, M.Div..  For questions please contact by phone [phone].
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
## 2019-11-15 DEATH — deceased

## 2021-02-10 IMAGING — DX DG CHEST 1V PORT
1 series · 1 of 1 positions shown · non-contrast
Comparison: 10/19/2019

CLINICAL DATA: ARDS.

EXAM:
PORTABLE CHEST 1 VIEW

[chest]
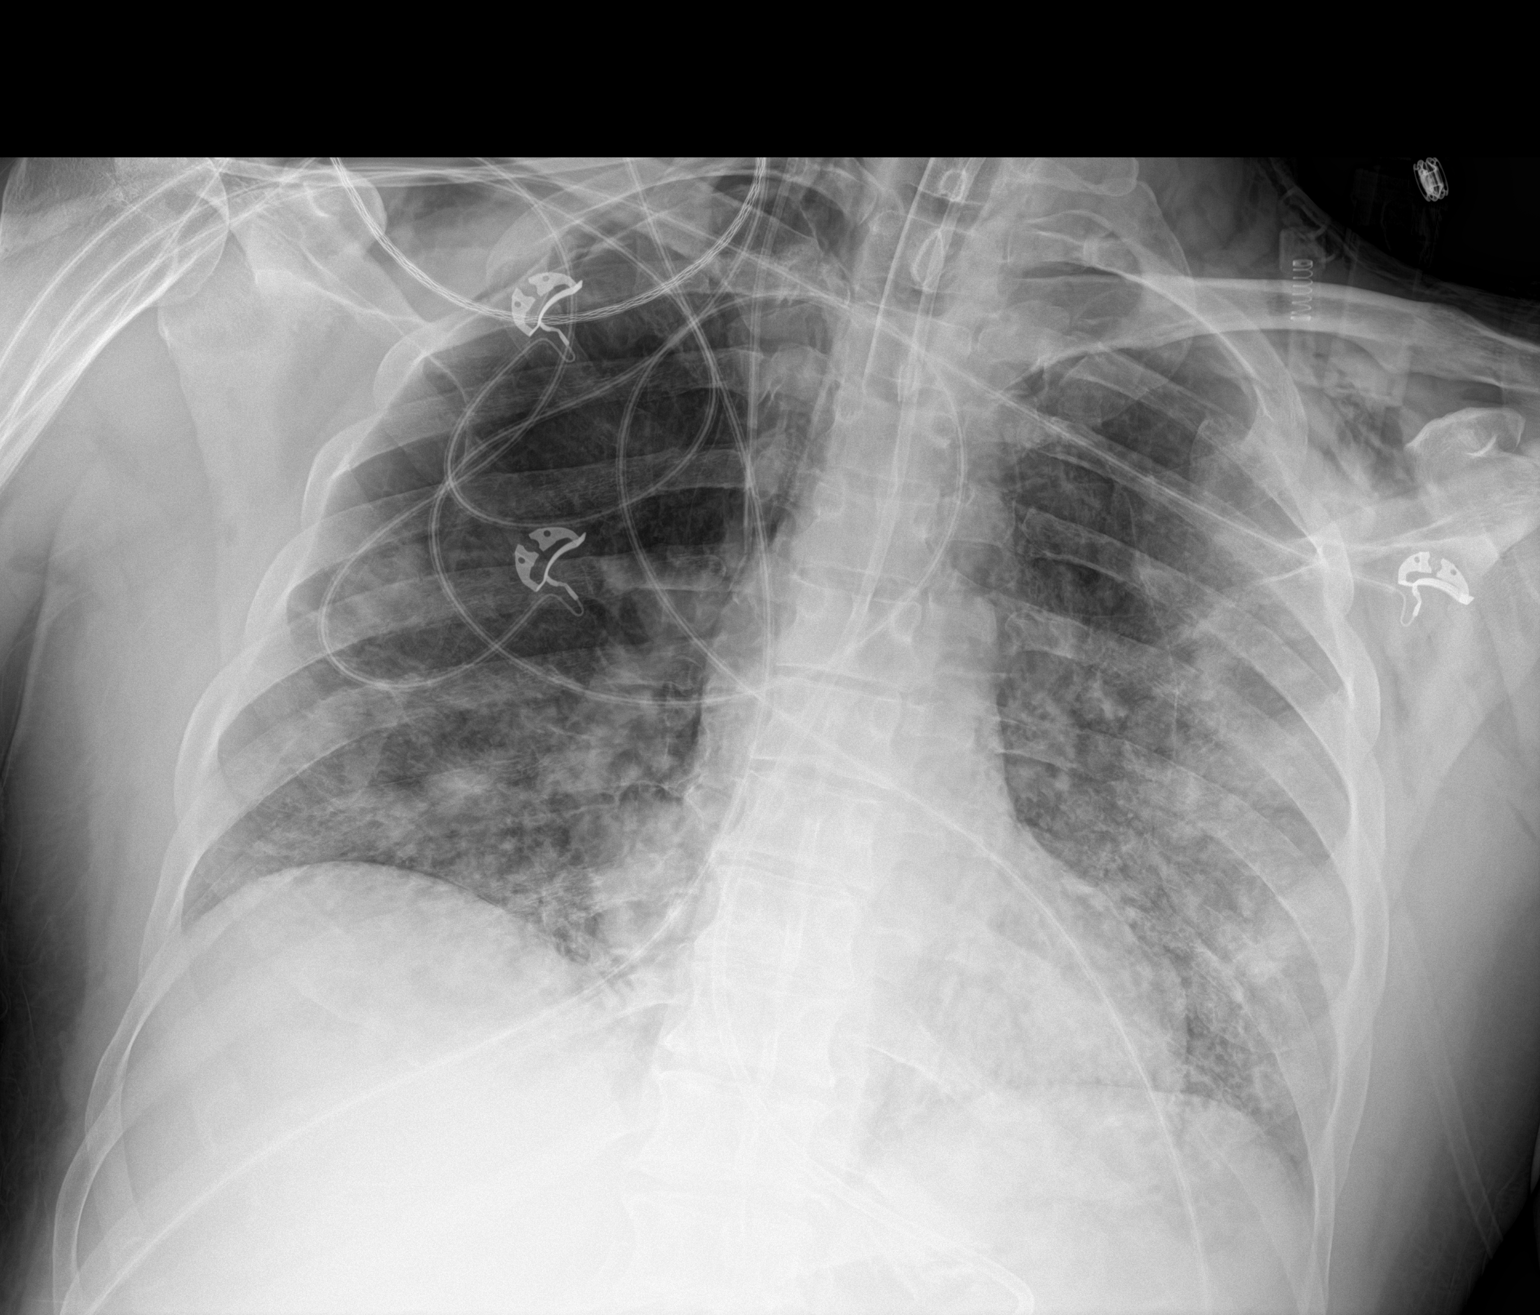

[1 of 1 positions shown; findings below may reference images not displayed]

FINDINGS: 8778 hours. Endotracheal tube tip is 5.2 cm above the base of the
carina. Right IJ central line tip overlies the mid SVC level. A
feeding tube passes into the stomach although the distal tip
position is not included on the film. Diffuse patchy airspace
disease, left greater than right, is similar to prior. Subcutaneous
emphysema and pneumomediastinum again noted with new tiny right
apical pneumothorax.
IMPRESSION: New tiny right apical pneumothorax.

No substantial change in the asymmetric bilateral airspace disease.

## 2021-02-16 IMAGING — DX DG ABDOMEN 1V
1 series · 1 of 1 positions shown · non-contrast
Comparison: October 14, 2019

CLINICAL DATA: Enteric tube position

EXAM:
ABDOMEN - 1 VIEW

[abdomen kub]
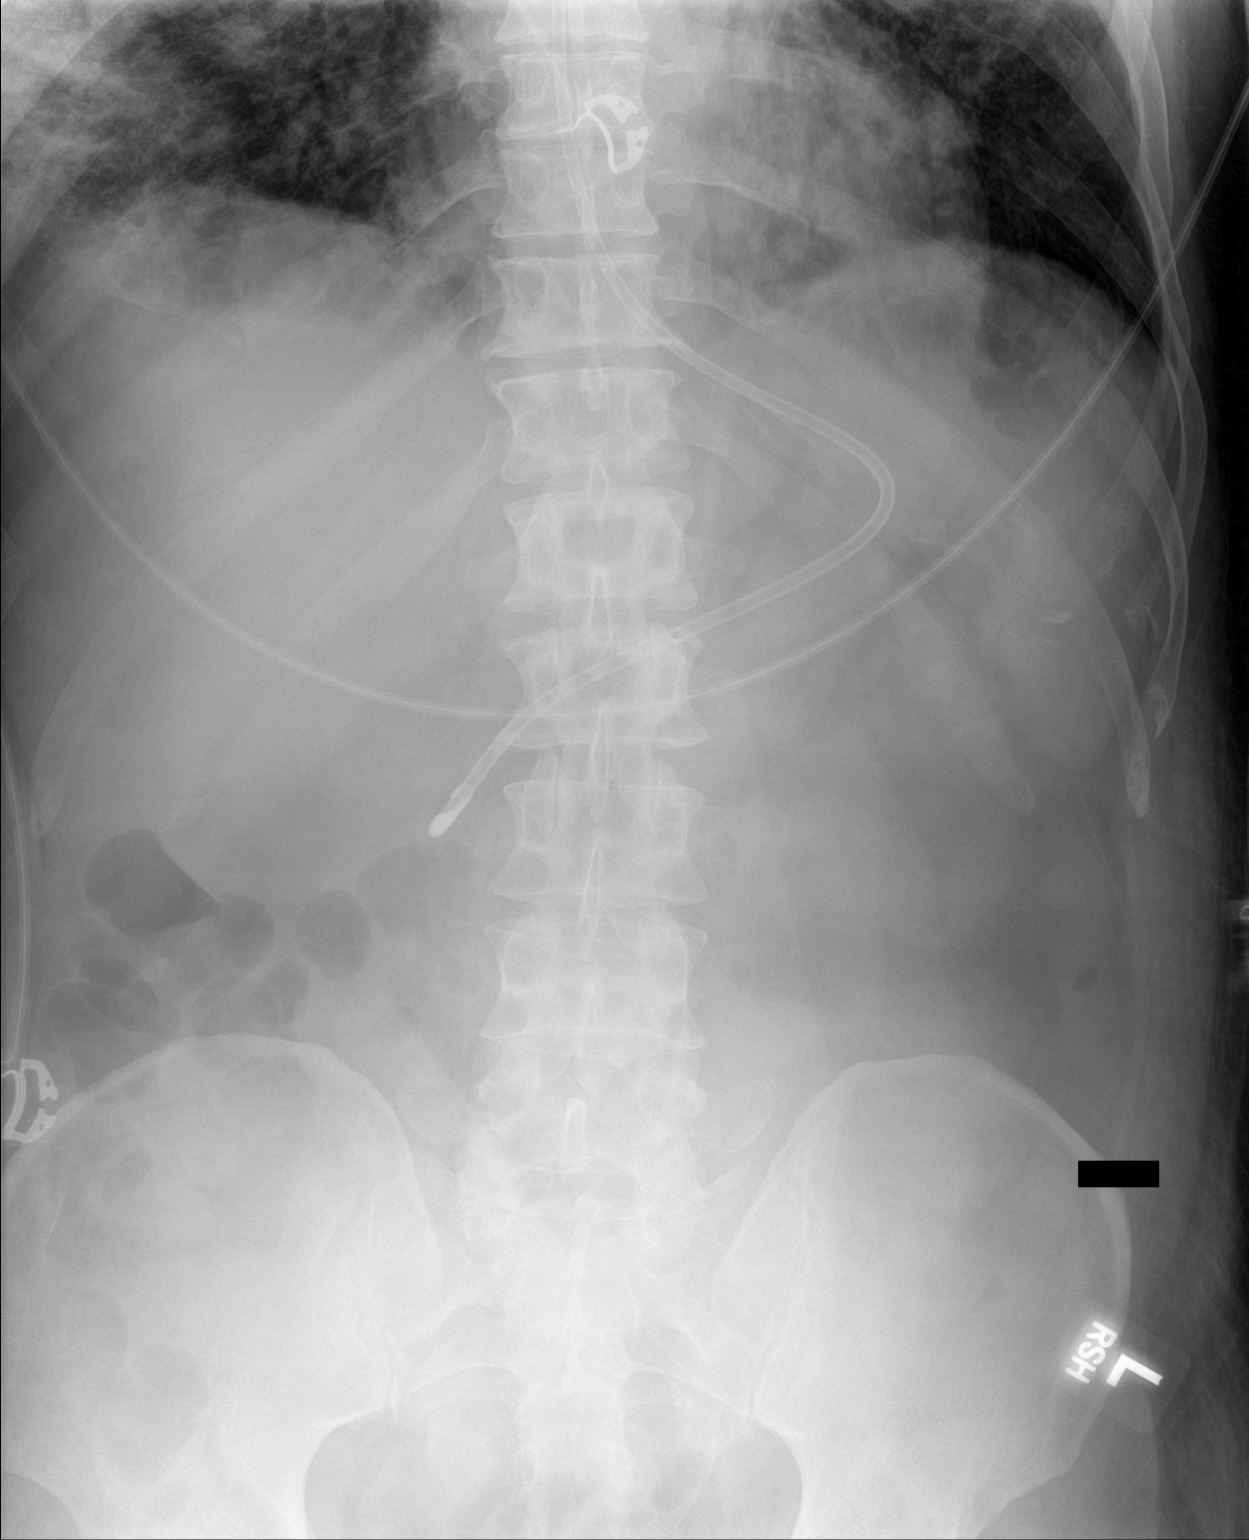

[1 of 1 positions shown; findings below may reference images not displayed]

FINDINGS: Enteric tube tip at level of proximal duodenum. No bowel dilatation
or air-fluid level to suggest bowel obstruction. No free air.
Ill-defined airspace opacity noted in the lung bases.
IMPRESSION: Feeding tube tip in proximal duodenum. No bowel obstruction or free
air. Patchy infiltrate in visualized lung bases.
